# Patient Record
Sex: Male | Born: 1995 | Race: Black or African American | Smoking: Never smoker
Health system: Southern US, Community
[De-identification: ages and names within clinical notes are randomized; demographics above are authoritative.]

## PROBLEM LIST (undated history)

## (undated) DIAGNOSIS — F909 Attention-deficit hyperactivity disorder, unspecified type: Secondary | ICD-10-CM

## (undated) DIAGNOSIS — F1910 Other psychoactive substance abuse, uncomplicated: Secondary | ICD-10-CM

## (undated) DIAGNOSIS — F209 Schizophrenia, unspecified: Secondary | ICD-10-CM

---
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
## 2014-08-31 ENCOUNTER — Emergency Department (HOSPITAL_COMMUNITY)
Admission: EM | Admit: 2014-08-31 | Discharge: 2014-09-02 | Disposition: A | Discharge status: Hospice | Payer: Self-pay | Attending: Emergency Medicine | Admitting: Emergency Medicine

## 2014-08-31 ENCOUNTER — Encounter (HOSPITAL_COMMUNITY): Payer: Self-pay | Admitting: Emergency Medicine

## 2014-08-31 DIAGNOSIS — F12159 Cannabis abuse with psychotic disorder, unspecified: Secondary | ICD-10-CM | POA: Insufficient documentation

## 2014-08-31 DIAGNOSIS — F329 Major depressive disorder, single episode, unspecified: Secondary | ICD-10-CM | POA: Insufficient documentation

## 2014-08-31 DIAGNOSIS — Z046 Encounter for general psychiatric examination, requested by authority: Secondary | ICD-10-CM

## 2014-08-31 DIAGNOSIS — F1910 Other psychoactive substance abuse, uncomplicated: Secondary | ICD-10-CM

## 2014-08-31 DIAGNOSIS — Z720 Tobacco use: Secondary | ICD-10-CM | POA: Insufficient documentation

## 2014-08-31 DIAGNOSIS — R4689 Other symptoms and signs involving appearance and behavior: Secondary | ICD-10-CM

## 2014-08-31 LAB — CBC
HEMATOCRIT: 46.3 % (ref 39.0–52.0)
HEMOGLOBIN: 16.2 g/dL (ref 13.0–17.0)
MCH: 31.2 pg (ref 26.0–34.0)
MCHC: 35 g/dL (ref 30.0–36.0)
MCV: 89.2 fL (ref 78.0–100.0)
Platelets: 204 10*3/uL (ref 150–400)
RBC: 5.19 MIL/uL (ref 4.22–5.81)
RDW: 13.1 % (ref 11.5–15.5)
WBC: 6.6 10*3/uL (ref 4.0–10.5)

## 2014-08-31 LAB — COMPREHENSIVE METABOLIC PANEL
ALT: 21 U/L (ref 0–53)
AST: 99 U/L — AB (ref 0–37)
Albumin: 4.7 g/dL (ref 3.5–5.2)
Alkaline Phosphatase: 59 U/L (ref 39–117)
Anion gap: 16 — ABNORMAL HIGH (ref 5–15)
BUN: 13 mg/dL (ref 6–23)
CHLORIDE: 100 meq/L (ref 96–112)
CO2: 23 mEq/L (ref 19–32)
Calcium: 10 mg/dL (ref 8.4–10.5)
Creatinine, Ser: 1.02 mg/dL (ref 0.50–1.35)
GLUCOSE: 114 mg/dL — AB (ref 70–99)
POTASSIUM: 3.7 meq/L (ref 3.7–5.3)
Sodium: 139 mEq/L (ref 137–147)
Total Bilirubin: 0.8 mg/dL (ref 0.3–1.2)
Total Protein: 7.9 g/dL (ref 6.0–8.3)

## 2014-08-31 LAB — RAPID URINE DRUG SCREEN, HOSP PERFORMED
Amphetamines: NOT DETECTED
BENZODIAZEPINES: NOT DETECTED
Barbiturates: NOT DETECTED
Cocaine: NOT DETECTED
OPIATES: NOT DETECTED
Tetrahydrocannabinol: POSITIVE — AB

## 2014-08-31 LAB — ETHANOL

## 2014-08-31 LAB — ACETAMINOPHEN LEVEL

## 2014-08-31 LAB — SALICYLATE LEVEL

## 2014-08-31 MED ORDER — ONDANSETRON HCL 4 MG PO TABS: 4.0000 mg | ORAL_TABLET | Freq: Three times a day (TID) | ORAL | Status: DC | PRN

## 2014-08-31 MED ORDER — ALUM & MAG HYDROXIDE-SIMETH 200-200-20 MG/5ML PO SUSP: 30.0000 mL | ORAL | Status: DC | PRN

## 2014-08-31 MED ORDER — IBUPROFEN 200 MG PO TABS
600.0000 mg | ORAL_TABLET | Freq: Three times a day (TID) | ORAL | Status: DC | PRN
  Administered 2014-09-02: 600 mg via ORAL
  Filled 2014-08-31: qty 3

## 2014-08-31 MED ORDER — LORAZEPAM 1 MG PO TABS: 1.0000 mg | ORAL_TABLET | Freq: Three times a day (TID) | ORAL | Status: DC | PRN

## 2014-08-31 MED ORDER — ZOLPIDEM TARTRATE 5 MG PO TABS: 5.0000 mg | ORAL_TABLET | Freq: Every evening | ORAL | Status: DC | PRN

## 2014-08-31 MED ORDER — NICOTINE 21 MG/24HR TD PT24
21.0000 mg | MEDICATED_PATCH | Freq: Every day | TRANSDERMAL | Status: DC
  Administered 2014-09-01 – 2014-09-02 (×2): 21 mg via TRANSDERMAL
  Filled 2014-08-31 (×2): qty 1

## 2014-08-31 MED ORDER — ACETAMINOPHEN 325 MG PO TABS: 650.0000 mg | ORAL_TABLET | ORAL | Status: DC | PRN
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
## 2014-08-31 NOTE — ED Notes (Signed)
Amil AmenJulia Little-mom-[phone]. Please ask psychiatrist call her in the morning.

## 2014-08-31 NOTE — ED Notes (Signed)
1 bag of belongings placed in locker 32

## 2014-08-31 NOTE — ED Notes (Signed)
Pt and belongings wanded 

## 2014-08-31 NOTE — ED Notes (Signed)
Pt will not talk.  Only answers questions with a nod of his head.  Denies SI/HI.

## 2014-08-31 NOTE — ED Notes (Signed)
Bed: [facility] Expected date:  Expected time:  Means of arrival:  Comments: 
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
## 2014-09-01 ENCOUNTER — Encounter (HOSPITAL_COMMUNITY): Payer: Self-pay | Admitting: Registered Nurse

## 2014-09-01 DIAGNOSIS — F1994 Other psychoactive substance use, unspecified with psychoactive substance-induced mood disorder: Secondary | ICD-10-CM

## 2014-09-01 DIAGNOSIS — R4689 Other symptoms and signs involving appearance and behavior: Secondary | ICD-10-CM | POA: Diagnosis present

## 2014-09-01 NOTE — ED Notes (Signed)
Patient guarded on approach today. Answering with yes and no answers mostly. Denies any SI/HI or a/v hallucinations. Reports that he feels his thinking is clear. No bizarre behaviors at this time. Will continue to monitor.
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
## 2014-09-01 NOTE — ED Notes (Signed)
Patient came to desk to get remote control

## 2014-09-02 ENCOUNTER — Encounter (HOSPITAL_COMMUNITY): Payer: Self-pay | Admitting: Psychiatry

## 2014-09-02 DIAGNOSIS — F1919 Other psychoactive substance abuse with unspecified psychoactive substance-induced disorder: Secondary | ICD-10-CM

## 2014-09-02 DIAGNOSIS — F1910 Other psychoactive substance abuse, uncomplicated: Secondary | ICD-10-CM | POA: Diagnosis present

## 2014-09-02 DIAGNOSIS — Z046 Encounter for general psychiatric examination, requested by authority: Secondary | ICD-10-CM | POA: Insufficient documentation
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
## 2014-09-02 NOTE — Progress Notes (Signed)
  CARE MANAGEMENT ED NOTE 09/02/2014  Patient:  [name],[name]   Account Number:  [id number]  Date Initiated:  09/02/2014  Documentation initiated by:  Edd ArbourGIBBS,Rockie Vawter  Subjective/Objective Assessment:   18 yr old self pay [place] pt (previously listd iwth medicaid but not verified as eligible per registration)     Subjective/Objective Assessment Detail:   Pt BIB [employer] IVC.  IVC papers state: "Last Thursday 08/25/14, the respondent was picked up at the above residence by a group of friends around 1400.  When he returned home around 0100, the respondent was a completely different person.  Respondent began hollering and cursing    no pcp     Action/Plan:   no pcp noted provided resources see notes below   Action/Plan Detail:   Anticipated DC Date:  09/02/2014     Status Recommendation to Physician:   Result of Recommendation:    Other ED Services  Consult Working Plan    DC Planning Services  Other  Outpatient Services - Pt will follow up  PCP issues    Choice offered to / List presented to:            Status of service:  Completed, signed off  ED Comments:   ED Comments Detail:  CM spoke [place] who [place]. CM discussed and provided written information for self pay pcps, importance of pcp for f/u care, www.needymeds.org, discounted pharmacies and other [place] resources such as [facility], [employer], affordable care act,  financial assistance, DSS and  health department Reviewed resources for [place] pcps like [facility], [facility] medicine at [address], [facility], general medical clinics, [facility] plus others, medication resources, [facility] out patient pharmacies and housing Pt voiced understanding and appreciation of resources provided  Provided [facility] contact information

## 2014-09-02 NOTE — [facility] Suicide Risk Assessment (Signed)
Suicide Risk Assessment  Discharge Assessment     Demographic Factors:  Male and Adolescent or young adult  Total Time spent with patient: 30 minutes  Psychiatric Specialty Exam:     Blood pressure 130/79, pulse 77, temperature 98 F (36.7 C), temperature source Oral, resp. rate 20, SpO2 100 %.There is no height or weight on file to calculate BMI.  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 Housing Leisure Time Physical Health Resilience Social Support  Sleep:      Musculoskeletal: Strength & Muscle Tone: within normal limits Gait & Station: normal Patient leans: N/A   Mental Status Per Nursing Assessment::   On Admission:   Paranoia  Current Mental Status by Physician: NA  Loss Factors: NA  Historical Factors: NA  Risk Reduction Factors:   Sense of responsibility to family, Living with another person, especially a relative and Positive social support  Continued Clinical Symptoms:  Anxiety  Cognitive Features That Contribute To Risk:  None  Suicide Risk:  Minimal: No identifiable suicidal ideation.  Patients presenting with no risk factors but with morbid ruminations; may be classified as minimal risk based on the severity of the depressive symptoms  Discharge Diagnoses:   AXIS I:  Substance Abuse AXIS II:  Deferred AXIS III:  History reviewed. No pertinent past medical history. AXIS IV:  other psychosocial or environmental  problems and problems related to social environment AXIS V:  61-70 mild symptoms  Plan Of Care/Follow-up recommendations:  Activity:  as tolerated Diet:  heart healthy  Is patient on multiple antipsychotic therapies at discharge:  No   Has Patient had three or more failed trials of antipsychotic monotherapy by history:  No  Recommended Plan for Multiple Antipsychotic Therapies: NA    LORD, JAMISON, PMH-NP 09/02/2014, 10:24 AM

## 2014-09-02 NOTE — Progress Notes (Signed)
Pt alert, observed interacting verbally with a peer in dayroom. Calm but remains guarded. Denied A/V hallucinations and pain when assessed this AM. Contracts for safety whie in facility. Refused his breakfast when offered. Cooperative with care. Emotional support offered. Safety maintained on Q 15 minutes checks without bizarre behavior to note at this time.

## 2014-09-02 NOTE — ED Notes (Signed)
Throughout the shift periodically patient would try to barricade his door. When asked why patient would stare at Clinical research associatewriter and tech. Then he would remove items. Patient appears to be responding to internal stimuli at times. Encouragement and support provided and safety maintain.

## 2014-09-02 NOTE — Progress Notes (Signed)
Pt discharge home as per order. D/C instructions reviewed with pt and understanding verbalized. Vitals done, WNL and documented. Pt denied pain, A/V hallucinations at time of departure. Gait steady,  affect bright.  All belongings returned to pt at time of departure and sheet signed in agreement to items received. No physical distress evident at time of departure from unit.

## 2014-10-11 ENCOUNTER — Emergency Department (HOSPITAL_COMMUNITY)
Admission: EM | Admit: 2014-10-11 | Discharge: 2014-10-12 | Discharge status: Against Medical Advice | Payer: Medicaid Other | Attending: Emergency Medicine | Admitting: Emergency Medicine

## 2014-10-11 DIAGNOSIS — R51 Headache: Secondary | ICD-10-CM | POA: Insufficient documentation

## 2014-10-11 DIAGNOSIS — Z720 Tobacco use: Secondary | ICD-10-CM | POA: Insufficient documentation

## 2014-10-11 NOTE — ED Notes (Signed)
Pt in c/o migraine for the last two weeks, denies other symptoms, no distress noted

## 2014-10-19 ENCOUNTER — Encounter (HOSPITAL_COMMUNITY): Payer: Self-pay | Admitting: Emergency Medicine

## 2014-10-19 ENCOUNTER — Emergency Department (HOSPITAL_COMMUNITY)
Admission: EM | Admit: 2014-10-19 | Discharge: 2014-10-20 | Disposition: A | Discharge status: Other Acute Inpatient Hospital | Payer: Self-pay

## 2014-10-19 DIAGNOSIS — F200 Paranoid schizophrenia: Secondary | ICD-10-CM | POA: Insufficient documentation

## 2014-10-19 DIAGNOSIS — Z720 Tobacco use: Secondary | ICD-10-CM | POA: Insufficient documentation

## 2014-10-19 DIAGNOSIS — F1210 Cannabis abuse, uncomplicated: Secondary | ICD-10-CM | POA: Insufficient documentation

## 2014-10-19 LAB — COMPREHENSIVE METABOLIC PANEL
ALK PHOS: 51 U/L (ref 39–117)
ALT: 10 U/L (ref 0–53)
AST: 19 U/L (ref 0–37)
Albumin: 5 g/dL (ref 3.5–5.2)
Anion gap: 10 (ref 5–15)
BILIRUBIN TOTAL: 1.1 mg/dL (ref 0.3–1.2)
BUN: 11 mg/dL (ref 6–23)
CO2: 27 mmol/L (ref 19–32)
Calcium: 9.5 mg/dL (ref 8.4–10.5)
Chloride: 102 mEq/L (ref 96–112)
Creatinine, Ser: 0.9 mg/dL (ref 0.50–1.35)
Glucose, Bld: 87 mg/dL (ref 70–99)
POTASSIUM: 3.4 mmol/L — AB (ref 3.5–5.1)
SODIUM: 139 mmol/L (ref 135–145)
TOTAL PROTEIN: 7.8 g/dL (ref 6.0–8.3)

## 2014-10-19 LAB — ACETAMINOPHEN LEVEL: Acetaminophen (Tylenol), Serum: <10 ug/mL — ABNORMAL LOW (ref 10–30)

## 2014-10-19 LAB — CBC
HCT: 41.7 % (ref 39.0–52.0)
Hemoglobin: 13.7 g/dL (ref 13.0–17.0)
MCH: 29.8 pg (ref 26.0–34.0)
MCHC: 32.9 g/dL (ref 30.0–36.0)
MCV: 90.8 fL (ref 78.0–100.0)
Platelets: 244 10*3/uL (ref 150–400)
RBC: 4.59 MIL/uL (ref 4.22–5.81)
RDW: 13.1 % (ref 11.5–15.5)
WBC: 6.4 10*3/uL (ref 4.0–10.5)

## 2014-10-19 LAB — RAPID URINE DRUG SCREEN, HOSP PERFORMED
AMPHETAMINES: NOT DETECTED
BARBITURATES: NOT DETECTED
Benzodiazepines: NOT DETECTED
COCAINE: NOT DETECTED
OPIATES: NOT DETECTED
Tetrahydrocannabinol: POSITIVE — AB

## 2014-10-19 LAB — SALICYLATE LEVEL: Salicylate Lvl: <4 mg/dL (ref 2.8–20.0)

## 2014-10-19 LAB — ETHANOL

## 2014-10-19 MED ORDER — ACETAMINOPHEN 325 MG PO TABS: 650.0000 mg | ORAL_TABLET | ORAL | Status: DC | PRN

## 2014-10-19 MED ORDER — LORAZEPAM 1 MG PO TABS: 1.0000 mg | ORAL_TABLET | Freq: Three times a day (TID) | ORAL | Status: DC | PRN

## 2014-10-19 NOTE — ED Provider Notes (Signed)
CSN: [id number]     Arrival date & time 10/19/14  2042 History   First MD Initiated Contact with Patient 10/19/14 2106     Chief Complaint  Patient presents with  . IVC   . Psychiatric Evaluation     (Consider location/radiation/quality/duration/timing/severity/associated sxs/prior Treatment) The history is provided by the patient. The history is limited by the condition of the patient.  pt with unspecific psychiatric hx, presents via gpd w ivc papers.  Per report pt with auditory hallucinations, and thoughts of suicide.  Pt indicates he does hear voices and hears thoughts in his head telling him to kill himself.  Denies HI.  Pt has been also using drugs, not sleeping, and exhibiting intermittent bizarre and paranoid thoughts.  Pt is very limited historian w little insight into symptoms - level 5 caveat.      History reviewed. No pertinent past medical history. History reviewed. No pertinent past surgical history. No family history on file. History  Substance Use Topics  . Smoking status: Current Every Day Smoker -- 0.50 packs/day    Types: Cigarettes  . Smokeless tobacco: Never Used  . Alcohol Use: No      Review of Systems  Unable to perform ROS: Psychiatric disorder  Constitutional: Negative for fever.  Neurological: Negative for headaches.  pt very poor historian, limited historian, psych illness - level 5 caveat.       Allergies  Review of patient's allergies indicates no known allergies.  Home Medications   Prior to Admission medications   Not on File   BP 140/79 mmHg  Pulse 75  Temp(Src) 98.8 F (37.1 C) (Oral)  Resp 15  SpO2 100% Physical Exam  Constitutional: He appears well-developed and well-nourished. No distress.  Pt appear suspicious, anxious, paranoid.  Changes clothes w chair placed against door, and hiding behind stretcher chair.   HENT:  Head: Atraumatic.  Eyes: Conjunctivae are normal. Pupils are equal, round, and reactive to light. No  scleral icterus.  Neck: Neck supple. No tracheal deviation present.  Cardiovascular: Normal rate, regular rhythm, normal heart sounds and intact distal pulses.   Pulmonary/Chest: Effort normal and breath sounds normal. No accessory muscle usage. No respiratory distress.  Abdominal: He exhibits no distension.  Musculoskeletal: Normal range of motion.  Neurological: He is alert.  Ambulates w steady gait.   Skin: Skin is warm and dry. He is not diaphoretic.  Psychiatric:  Withdrawn, flat affect. +SI.    Nursing note and vitals reviewed.   ED Course  Procedures (including critical care time) Labs Review  Results for orders placed or performed during the hospital encounter of 10/19/14  Acetaminophen level  Result Value Ref Range   Acetaminophen (Tylenol), Serum <10.0 (L) 10 - 30 ug/mL  CBC  Result Value Ref Range   WBC 6.4 4.0 - 10.5 K/uL   RBC 4.59 4.22 - 5.81 MIL/uL   Hemoglobin 13.7 13.0 - 17.0 g/dL   HCT 40.9 81.1 - 91.4 %   MCV 90.8 78.0 - 100.0 fL   MCH 29.8 26.0 - 34.0 pg   MCHC 32.9 30.0 - 36.0 g/dL   RDW 78.2 95.6 - 21.3 %   Platelets 244 150 - 400 K/uL  Comprehensive metabolic panel  Result Value Ref Range   Sodium 139 135 - 145 mmol/L   Potassium 3.4 (L) 3.5 - 5.1 mmol/L   Chloride 102 96 - 112 mEq/L   CO2 27 19 - 32 mmol/L   Glucose, Bld 87 70 - 99 mg/dL  BUN 11 6 - 23 mg/dL   Creatinine, Ser 1.610.90 0.50 - 1.35 mg/dL   Calcium 9.5 8.4 - 09.610.5 mg/dL   Total Protein 7.8 6.0 - 8.3 g/dL   Albumin 5.0 3.5 - 5.2 g/dL   AST 19 0 - 37 U/L   ALT 10 0 - 53 U/L   Alkaline Phosphatase 51 39 - 117 U/L   Total Bilirubin 1.1 0.3 - 1.2 mg/dL   GFR calc non Af Amer >90 >90 mL/min   GFR calc Af Amer >90 >90 mL/min   Anion gap 10 5 - 15  Ethanol (ETOH)  Result Value Ref Range   Alcohol, Ethyl (B) <5 0 - 9 mg/dL  Salicylate level  Result Value Ref Range   Salicylate Lvl <4.0 2.8 - 20.0 mg/dL      MDM   Labs.  Psych team consulted.  Disposition per psych  team.  Reviewed nursing notes and prior charts for additional history.   Recheck pt content, alert, awaiting psych eval and likely inpatient psych tx.  dispo per psych team.      Suzi RootsKevin E Shiniqua Groseclose, MD 10/19/14 2235
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
## 2014-10-19 NOTE — ED Notes (Signed)
Pt has two bags of belongings including a pair of pants, a pair of camouflage pants, a pair of shorts, underwear, socks, white undershirt, white tennis shoes, white cell phone, partial pack of [place] cigarettes and a lighter.

## 2014-10-19 NOTE — ED Notes (Signed)
Pt spilled most of the UDS specimen, what was left in the cup was sent to lab. Pt is aware he may be asked to catch another sample.

## 2014-10-19 NOTE — [facility] Counselor (Signed)
Per Donell SievertSpencer Simon, PA, pt will be re-evaluated by psychiatry in the morning for disposition.    Cyndie MullAnna Mayson Mcneish, [facility] Triage Specialist

## 2014-10-19 NOTE — [facility] Counselor (Signed)
TTS Counselor informed pt's attending RN that behavioral health assessment would be completed shortly.  Counselor reviewed pt's IVC paperwork in preparation for assessment. Counselor also attempted to call pt's mother (who took out IVC) but no one answered phone.   Cyndie MullAnna Tayleigh Wetherell, [facility] Triage Specialist
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
## 2014-10-20 ENCOUNTER — Inpatient Hospital Stay (HOSPITAL_COMMUNITY)
Admission: AD | Admit: 2014-10-20 | Discharge: 2014-10-26 | DRG: 885 | Disposition: A | Discharge status: Home / Self Care | Payer: Federal, State, Local not specified - Other | Source: Intra-hospital | Attending: Psychiatry | Admitting: Psychiatry

## 2014-10-20 ENCOUNTER — Encounter (HOSPITAL_COMMUNITY): Payer: Self-pay

## 2014-10-20 DIAGNOSIS — Z609 Problem related to social environment, unspecified: Secondary | ICD-10-CM | POA: Diagnosis present

## 2014-10-20 DIAGNOSIS — G4700 Insomnia, unspecified: Secondary | ICD-10-CM | POA: Diagnosis present

## 2014-10-20 DIAGNOSIS — F17210 Nicotine dependence, cigarettes, uncomplicated: Secondary | ICD-10-CM | POA: Diagnosis present

## 2014-10-20 DIAGNOSIS — F200 Paranoid schizophrenia: Secondary | ICD-10-CM | POA: Insufficient documentation

## 2014-10-20 DIAGNOSIS — F909 Attention-deficit hyperactivity disorder, unspecified type: Secondary | ICD-10-CM | POA: Diagnosis present

## 2014-10-20 DIAGNOSIS — R45851 Suicidal ideations: Secondary | ICD-10-CM

## 2014-10-20 DIAGNOSIS — F1010 Alcohol abuse, uncomplicated: Secondary | ICD-10-CM | POA: Diagnosis present

## 2014-10-20 DIAGNOSIS — F28 Other psychotic disorder not due to a substance or known physiological condition: Secondary | ICD-10-CM | POA: Insufficient documentation

## 2014-10-20 DIAGNOSIS — F24 Shared psychotic disorder: Secondary | ICD-10-CM | POA: Diagnosis present

## 2014-10-20 DIAGNOSIS — F419 Anxiety disorder, unspecified: Secondary | ICD-10-CM | POA: Diagnosis present

## 2014-10-20 DIAGNOSIS — F1220 Cannabis dependence, uncomplicated: Secondary | ICD-10-CM | POA: Diagnosis present

## 2014-10-20 DIAGNOSIS — F209 Schizophrenia, unspecified: Secondary | ICD-10-CM | POA: Diagnosis present

## 2014-10-20 DIAGNOSIS — F1290 Cannabis use, unspecified, uncomplicated: Secondary | ICD-10-CM

## 2014-10-20 DIAGNOSIS — F19959 Other psychoactive substance use, unspecified with psychoactive substance-induced psychotic disorder, unspecified: Secondary | ICD-10-CM

## 2014-10-20 DIAGNOSIS — F19929 Other psychoactive substance use, unspecified with intoxication, unspecified: Secondary | ICD-10-CM | POA: Insufficient documentation

## 2014-10-20 DIAGNOSIS — F29 Unspecified psychosis not due to a substance or known physiological condition: Secondary | ICD-10-CM | POA: Diagnosis present

## 2014-10-20 HISTORY — DX: Attention-deficit hyperactivity disorder, unspecified type: F90.9

## 2014-10-20 MED ORDER — HYDROXYZINE HCL 25 MG PO TABS: 25.0000 mg | ORAL_TABLET | Freq: Three times a day (TID) | ORAL | Status: DC | PRN

## 2014-10-20 MED ORDER — ALUM & MAG HYDROXIDE-SIMETH 200-200-20 MG/5ML PO SUSP: 30.0000 mL | ORAL | Status: DC | PRN

## 2014-10-20 MED ORDER — ACETAMINOPHEN 325 MG PO TABS: 650.0000 mg | ORAL_TABLET | ORAL | Status: DC | PRN

## 2014-10-20 MED ORDER — MAGNESIUM HYDROXIDE 400 MG/5ML PO SUSP: 30.0000 mL | Freq: Every day | ORAL | Status: DC | PRN

## 2014-10-20 MED ORDER — TRAZODONE HCL 100 MG PO TABS
100.0000 mg | ORAL_TABLET | Freq: Every day | ORAL | Status: DC
  Filled 2014-10-20: qty 14
  Filled 2014-10-20 (×6): qty 1
  Filled 2014-10-20: qty 14
  Filled 2014-10-20: qty 1

## 2014-10-20 MED ORDER — PALIPERIDONE ER 3 MG PO TB24
3.0000 mg | ORAL_TABLET | Freq: Every day | ORAL | Status: DC
  Administered 2014-10-21: 3 mg via ORAL
  Filled 2014-10-20 (×3): qty 1

## 2014-10-20 MED ORDER — HYDROXYZINE HCL 25 MG PO TABS
25.0000 mg | ORAL_TABLET | Freq: Three times a day (TID) | ORAL | Status: DC | PRN
  Filled 2014-10-20: qty 20

## 2014-10-20 MED ORDER — ACETAMINOPHEN 325 MG PO TABS: 650.0000 mg | ORAL_TABLET | Freq: Four times a day (QID) | ORAL | Status: DC | PRN

## 2014-10-20 MED ORDER — PALIPERIDONE ER 3 MG PO TB24
3.0000 mg | ORAL_TABLET | Freq: Every day | ORAL | Status: DC
  Administered 2014-10-20: 3 mg via ORAL
  Filled 2014-10-20: qty 1

## 2014-10-20 MED ORDER — TRAZODONE HCL 100 MG PO TABS: 100.0000 mg | ORAL_TABLET | Freq: Every day | ORAL | Status: DC

## 2014-10-20 NOTE — BH Assessment (Signed)
[facility] Assessment Progress Note  [facility] by [name], MD.  Per Thurman CoyerEric Kaplan, RN, [facility], pt has been assigned to Rm [id number].  Pt was not able sign Consent to Release Information due to his state of mind, but reports that he does not currently have an outpatient provider.  [facility]  Pt's nurse, Rudean HittDawnaly, has been notified, and she agrees to call report to [phone].  [facility]  Doylene Canninghomas Kineta Fudala, MA Triage Specialist 10/20/2014 @ 16:14

## 2014-10-20 NOTE — ED Notes (Signed)
Patient asked to use the phone.  When he was using it he went into the dayroom to sit on the sofa.  When he finished his call he broke the phone and took the battery out of it.  He stated he did it because he was mad at his mother for telling lies about him.  He has still not slept or lain down at all today.  He is eating at least half of the food on his meal trays.  [facility], but does not seem to fully understand why even though it has been explained to him.
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
## 2014-10-20 NOTE — ED Notes (Signed)
[facility]  Left the unit ambulatory.

## 2014-10-20 NOTE — ED Notes (Signed)
Patient continues to sit up in chair even though bed linen was change. Encouragement and support provided and safety maintain.

## 2014-10-20 NOTE — ED Notes (Signed)
Patient continues to sit up in chair in his room. Writer ask patient if he would to lay down in his bed. Patient ask writer if the bed sheets could be change. Writer informs patient the sheets  would be change.

## 2014-10-21 ENCOUNTER — Encounter (HOSPITAL_COMMUNITY): Payer: Self-pay

## 2014-10-21 DIAGNOSIS — F1220 Cannabis dependence, uncomplicated: Secondary | ICD-10-CM | POA: Diagnosis present

## 2014-10-21 DIAGNOSIS — F19959 Other psychoactive substance use, unspecified with psychoactive substance-induced psychotic disorder, unspecified: Secondary | ICD-10-CM

## 2014-10-21 DIAGNOSIS — R45850 Homicidal ideations: Secondary | ICD-10-CM

## 2014-10-21 DIAGNOSIS — F209 Schizophrenia, unspecified: Principal | ICD-10-CM

## 2014-10-21 DIAGNOSIS — F19929 Other psychoactive substance use, unspecified with intoxication, unspecified: Secondary | ICD-10-CM | POA: Insufficient documentation

## 2014-10-21 DIAGNOSIS — F29 Unspecified psychosis not due to a substance or known physiological condition: Secondary | ICD-10-CM | POA: Diagnosis present

## 2014-10-21 MED ORDER — BENZTROPINE MESYLATE 0.5 MG PO TABS
0.5000 mg | ORAL_TABLET | Freq: Every day | ORAL | Status: DC
  Administered 2014-10-21 – 2014-10-25 (×5): 0.5 mg via ORAL
  Filled 2014-10-21 (×2): qty 1
  Filled 2014-10-21: qty 14
  Filled 2014-10-21 (×3): qty 1
  Filled 2014-10-21: qty 14
  Filled 2014-10-21: qty 1

## 2014-10-21 MED ORDER — HALOPERIDOL 2 MG PO TABS
2.0000 mg | ORAL_TABLET | Freq: Every day | ORAL | Status: DC
  Administered 2014-10-21 – 2014-10-25 (×5): 2 mg via ORAL
  Filled 2014-10-21: qty 14
  Filled 2014-10-21 (×6): qty 1
  Filled 2014-10-21: qty 14

## 2014-10-21 NOTE — [facility] Suicide Risk Assessment (Signed)
[facility] INPATIENT:  Family/Significant Other Suicide Prevention Education  Suicide Prevention Education:  Education Completed; [name], [id number]  has been identified by the patient as the family member/significant other with whom the patient will be residing, and identified as the person(s) who will aid the patient in the event of a mental health crisis (suicidal ideations/suicide attempt).  With written consent from the patient, the family member/significant other has been provided the following suicide prevention education, prior to the and/or following the discharge of the patient.  The suicide prevention education provided includes the following:  Suicide risk factors  Suicide prevention and interventions  National Suicide Hotline telephone number  [facility] assessment telephone number  [facility] Emergency Assistance 911  [facility] and/or Residential Mobile Crisis Unit telephone number  Request made of family/significant other to:  Remove weapons (e.g., guns, rifles, knives), all items previously/currently identified as safety concern.    Remove drugs/medications (over-the-counter, prescriptions, illicit drugs), all items previously/currently identified as a safety concern.  The family member/significant other verbalizes understanding of the suicide prevention education information provided.  The family member/significant other agrees to remove the items of safety concern listed above.  Daryel Geraldorth, Nakyla Bracco B 10/21/2014, 4:02 PM
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
## 2014-10-21 NOTE — [facility] Group Notes (Signed)
[facility]   10/21/2014 9:51 AM  Participation Quality:  Invited.  Was in the shower    [place], AddisonRodney B

## 2014-10-21 NOTE — Progress Notes (Signed)
The patient stood in the hallway while group took place and did not have anything to share.

## 2014-10-21 NOTE — [facility] Group Notes (Signed)
[facility] LCSW Group Therapy  10/21/2014  1:05 PM  Type of Therapy:  Group therapy  Participation Level:  Active  Participation Quality:  Attentive  Affect:  Flat  Cognitive:  Oriented  Insight:  Limited  Engagement in Therapy:  Limited  Modes of Intervention:  Discussion, Socialization  Summary of Progress/Problems:  Chaplain was here to lead a group on themes of hope and courage. [name] initially declined to pick a picture.  He picked one after everyone else was done.  Held up a picture of a rusty chain.  "This chain is going to break."  Unable to elaborate further.  Zachary Geraldorth, [name] B 10/21/2014 1:33 PM

## 2014-10-21 NOTE — Progress Notes (Signed)
Pt is a 19 yr old IVC for psychosis. Pt is a poor historian and initially reports that he is here for abusing THC and ETOH. Pt then reported that he is here because " they think I'm responding to voices". Pt is denying any SI/HI/AVH. Pt appears bizarre on admission. Pt also appears to be responding to internal stimuli. Pt reports a hx of ADHD. Pt reports being off of ADHD medication for over about 12 years. Pt denies any other PMH. Pt reports occasional ETOH use. "They say I be out of it". Pt orientated to the unit's polices and procedures. Pt verbalizes the goal of " having my own house". Pt failed with coming up with an additional goal.

## 2014-10-21 NOTE — Tx Team (Signed)
Initial Interdisciplinary Treatment Plan   PATIENT STRESSORS: Substance abuse   PATIENT STRENGTHS: General fund of knowledge Physical Health Supportive family/friends   PROBLEM LIST: Problem List/Patient Goals Date to be addressed Date deferred Reason deferred Estimated date of resolution  "Anxiety"  1/7     Pt reports a hx of AH. " They (ED) sent me here because they thought I was responding to voices".  Psychosis (paranoia noted) 1/7     Pt initially reported that he was here because he used " THC and ETOH". Pt reports using ETOH social. "People say I be out of it" 1/7     Pt repeatedly denied having any two goals this time. Pt eventually voiced " I want to get my own house".  1/7                                    DISCHARGE CRITERIA:  Improved stabilization in mood, thinking, and/or behavior Medical problems require only outpatient monitoring Motivation to continue treatment in a less acute level of care Need for constant or close observation no longer present Reduction of life-threatening or endangering symptoms to within safe limits Verbal commitment to aftercare and medication compliance  PRELIMINARY DISCHARGE PLAN: Attend PHP/IOP Attend 12-step recovery group  PATIENT/FAMIILY INVOLVEMENT: This treatment plan has been presented to and reviewed with the patient, [name].  The patient and family have been given the opportunity to ask questions and make suggestions.  [name] A 10/21/2014, 5:38 AM
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
## 2014-10-21 NOTE — Progress Notes (Signed)
D: Pt presents anxious this morning. Pt observed to be cautious during shift assessment. Pt forwarded little information and denied having any SI/HI/AVH. Pt denies feeling depressed. Pt appears paranoid this morning. During med administration but stared at the medication before taking medicine. Pt then asked for a cup to spit in and then stared at that. Pt walked around wide-eyed as if he is worried about something. Pt has delayed responses when asked questions. Pt compliant with taking meds.  A:  Medications administered as ordered per MD. Verbal support given. Pt encouraged to attend groups. 15 minute checks performed for safety.  R: Pt safety maintained. Pt verbalized not to harm self.
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
## 2014-10-22 ENCOUNTER — Encounter (HOSPITAL_COMMUNITY): Payer: Self-pay | Admitting: Registered Nurse

## 2014-10-22 DIAGNOSIS — F1220 Cannabis dependence, uncomplicated: Secondary | ICD-10-CM

## 2014-10-22 DIAGNOSIS — F28 Other psychotic disorder not due to a substance or known physiological condition: Secondary | ICD-10-CM

## 2014-10-22 NOTE — Progress Notes (Signed)
D: Pt visible within the milieu but with minimal interaction with others. Pt appears paranoid and cautious in behavior. Pt voices that he is ready to leave. " I don't know why I'm still here". " I don't want to harm nobody or myself". It was reported to this Clinical research associatewriter that pt's mom wanted to speak with Clinical research associatewriter about her son. Pt's mom was not listed on the consent to release information form. Pt declined for writer to speak with his mom about his stay here at this time. Pt did speak with his mom afterwards. Pt is denying any SI/HI/AVH. Pt is compliant with his medications. A: Writer administered scheduled medications to pt, per MD orders. Continued support and availability as needed was extended to this pt. Staff continue to monitor pt with q6115min checks.  R: No adverse drug reactions noted. Pt receptive to treatment. Pt remains safe at this time.

## 2014-10-22 NOTE — Progress Notes (Signed)
Adult Psychoeducational Group Note  Date:  10/22/2014 Time:  4:25 PM  Group Topic/Focus:  Healthy Communication:   The focus of this group is to discuss communication, barriers to communication, as well as healthy ways to communicate with others.  Participation Level:  Active  Participation Quality:  Appropriate  Affect:  Appropriate  Cognitive:  Appropriate  Insight: Appropriate  Engagement in Group:  Engaged  Modes of Intervention:  Discussion and Problem-solving  Additional Comments:  Pt stated his day is going all right.  Pt stated two coping skills he use is listening to music and counting money.  Pt stated a favorite movie is [employer].  Wynema BirchCagle, Damoney Julia D 10/22/2014, 4:25 PM

## 2014-10-22 NOTE — [facility] Group Notes (Signed)
The focus of this group is to educate the patient on the purpose and policies of crisis stabilization and provide a format to answer questions about their admission.  The group details unit policies and expectations of patients while admitted. This group was also time to discuss the patient self inventory at 1000 hrs. Patient did not attend this group. 

## 2014-10-22 NOTE — Progress Notes (Signed)
The focus of this group is to help patients review their daily goal of treatment and discuss progress on daily workbooks. Pt attended the evening group session but responded minimally to discussion prompts from the Writer. Pt shared that today was a good day, but could not specify a reason why. Pt reported having no additional needs from Nursing Staff this evening when asked. Pt appeared anxious in group and he averted his gaze when speaking/being spoken to.

## 2014-10-22 NOTE — [facility] Group Notes (Signed)
Patient did not attend the RN group at 0930 hrs. 

## 2014-10-22 NOTE — Progress Notes (Signed)
D: Patient denies SI/HI and A/V hallucinations; patient reports sleep is fair; reports appetite is fair; reports energy level is normal ; reports ability to concentrate is good; rates depression as 5/10; rates hopelessness 4/10; rates anxiety as 4/10; patient reports some irritability  A: Monitored q 15 minutes; patient encouraged to attend groups; patient educated about medications; patient given medications per physician orders; patient encouraged to express feelings and/or concerns  R: Patient is irritable but appears to try and keep self calm; patient asked to moved from his room and when patient was asked why the patient stated " he is weird and that he did not like him"; patient was asked what did the roommate do and the patient stated " he walks around the room"; patient would not give permission to allow his mother to ask about his care or to know how he is doing; patient is very soft spoken to the point of mumbling and you must ask him to repeat himself; patient is very minimal, guarded, and cautious; patient's interaction with staff and peers is very minimal

## 2014-10-22 NOTE — Progress Notes (Signed)
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 ADHD (attention deficit hyperactivity disorder)    Axis IV: other psychosocial or environmental problems, problems related to social environment and problems with primary support group Axis V: 31-40 impairment in reality testing  ADL's:  Intact  Sleep: Good  Appetite:  Good  Suicidal Ideation:  Denies  Homicidal Ideation:  Denies  AEB (as evidenced by):  Psychiatric Specialty Exam: Physical Exam  Constitutional: He is oriented to person, place, and time.  HENT:  Head: Normocephalic.  Neck: Normal range of motion.  Respiratory: Effort normal.  Musculoskeletal: Normal range of motion.  Neurological: He is alert and oriented to person, place, and time.  Skin: Skin is warm and dry.  Psychiatric: His speech is normal. He is slowed and withdrawn. He exhibits a depressed mood. He expresses no homicidal and no suicidal ideation.    ROS  Blood pressure 106/56, pulse 108, temperature 98 F (36.7 C), temperature source Oral, resp. rate 20, height  (1.803 m), weight 72.122 kg (159 lb).Body mass index is 22.19 kg/(m^2).  General Appearance: Casual and Fairly Groomed  Patent attorney::  Good  Speech:  Clear and Coherent and Slow  Volume:  Decreased  Mood:  Depressed  Affect:  Labile  Thought Process:  Irrelevant  Orientation:  Full (Time, Place, and Person)  Thought  Content:  Paranoid Ideation  Suicidal Thoughts:  No  Homicidal Thoughts:  No  Memory:  Immediate;   Fair Recent;   Fair Remote;   Fair  Judgement:  Poor  Insight:  Lacking  Psychomotor Activity:  Normal  Concentration:  Fair  Recall:  [employer] of Knowledge:Fair  Language: Fair  Akathisia:  No  Handed:  Right  AIMS (if indicated):     Assets:  Physical Health  Sleep:  Number of Hours: 6.75   Musculoskeletal: Strength & Muscle Tone: within normal limits Gait & Station: normal Patient leans: N/A  Current Medications: Current Facility-Administered Medications  Medication Dose Route Frequency Provider Last Rate Last Dose  . acetaminophen (TYLENOL) tablet 650 mg  650 mg Oral Q6H PRN Nanine Means, NP      . alum & mag hydroxide-simeth (MAALOX/MYLANTA) 200-200-20 MG/5ML suspension 30 mL  30 mL Oral Q4H PRN Nanine Means, NP      . benztropine (COGENTIN) tablet 0.5 mg  0.5 mg Oral QHS Fransisca Kaufmann, NP   0.5 mg at 10/21/14 2202  . haloperidol (HALDOL) tablet 2 mg  2 mg Oral QHS Fransisca Kaufmann, NP   2 mg at 10/21/14 2202  . hydrOXYzine (ATARAX/VISTARIL) tablet 25 mg  25 mg Oral TID PRN Nanine Means, NP      . magnesium hydroxide (MILK OF MAGNESIA) suspension 30 mL  30 mL Oral Daily PRN Nanine Means, NP      . traZODone (DESYREL) tablet 100 mg  100 mg Oral QHS Nanine Means, NP   100 mg at 10/20/14 2200    Lab Results: No results found for this or  any previous visit (from the past 48 hour(s)).  Physical Findings: AIMS: Facial and Oral Movements Muscles of Facial Expression: None, normal Lips and Perioral Area: None, normal Jaw: None, normal Tongue: None, normal,Extremity Movements Upper (arms, wrists, hands, fingers): None, normal Lower (legs, knees, ankles, toes): None, normal, Trunk Movements Neck, shoulders, hips: None, normal, Overall Severity Severity of abnormal movements (highest score from questions above): None, normal Incapacitation due to abnormal movements: None,  normal Patient's awareness of abnormal movements (rate only patient's report): No Awareness, Dental Status Current problems with teeth and/or dentures?: No Does patient usually wear dentures?: No  CIWA:    COWS:     Treatment Plan Summary: Daily contact with patient to assess and evaluate symptoms and progress in treatment Medication management  Will continue with current treatment plan for inpatient and stabilization.    Plan:  Medical Decision Making Problem Points:  Established problem, stable/improving (1), Review of last therapy session (1) and Review of psycho-social stressors (1) Data Points:  Review or order clinical lab tests (1) Review of medication regiment & side effects (2)  I certify that inpatient services furnished can reasonably be expected to improve the patient's condition.   Irvin Bastin, FNP-BC 10/22/2014, 3:03 PM

## 2014-10-23 DIAGNOSIS — F28 Other psychotic disorder not due to a substance or known physiological condition: Secondary | ICD-10-CM | POA: Insufficient documentation

## 2014-10-23 NOTE — Plan of Care (Signed)
Problem: Ineffective individual coping Goal: STG: Patient will remain free from self harm Outcome: Progressing Patient remains free from self harm. 15 minute checks continued per protocol for patient safety.   Problem: Diagnosis: Increased Risk For Suicide Attempt Goal: LTG-Patient Will Report Improvement in Psychotic Symptoms LTG (by discharge) : Patient will report improvement in psychotic symptoms.  Outcome: Not Progressing Patient denies having any psychotic symptoms but is observed by RN to be paranoid at times and is pacing the halls. Goal: STG-Patient Will Attend All Groups On The Unit Outcome: Progressing Patient did attend RN group this morning but refused to participate.   Comments:  Alena BillsGuthrie, Zyionna Pesce A, RN

## 2014-10-23 NOTE — Progress Notes (Signed)
D:Pt noted for improved eye contact. Pt is visible within the milieu but with little to no interaction with others. Pt continues to question how long his stay would be here. Pt is also under the assumption that he is to be transferred to another facility at discharge. Pt informed that his pending discharge plans does not include a transfer to another inpatient facility at this time. Pt is engaging more in conversation with this Clinical research associatewriter. Pt continues to have poor insight of his mental health state. Pt is noted to be less paranoid in behavior. Pt is increasingly becoming more clear and coherent. Pt is currently denying any SI/HI/AVH. Pt was able to repeat the indications of his qhs Haldol to Clinical research associatewriter. Pt verbalizes and actively shows his compliance with his current plan of care.Pt reports getting a sufficient amount of sleep without Trazodone.  A: Writer administered scheduled medications to pt, per MD orders. Continued support and availability as needed was extended to this pt. Staff continue to monitor pt with q2815min checks.  R: No adverse drug reactions noted. Pt receptive to treatment. Pt remains safe at this time.

## 2014-10-23 NOTE — Progress Notes (Signed)
D: Patient is alert and oriented. Pt's mood and affect is suspicious and flat. Pt's eye contact is brief and glaring at times. Pt's speech is soft and hard to hear at times. Pt forwards little with RN. Pt remains paranoid at times. Pt requests hygiene products. Pt reports his depression is 1/10 and denies hopelessness and anxiety. Pt states his goal for the day is "getting myself together." Pt writes on his self inventory sheet response to having additional questions is "Nothing just keep watching dorothy at night." When RN later reviewed self inventory sheet with pt he states "Oh I meant to write door on there;" pt requests that his door be left open at night. Pt refused to respond to RN questions this morning d/t the need to "spit," pt was witholding saliva in mouth. Pt denies SI/HI and AVH. Patient observed pacing the halls some this afternoon. Pt remains quiet and not interacting with others. A: Hygiene products provided to pt. Encouragement/Support provided to pt. Active listening by RN. 15 minute checks continued per protocol for patient safety.  R: Patient cooperative to nursing interventions.

## 2014-10-23 NOTE — [facility] Group Notes (Signed)
[facility] LCSW Group Therapy  10/23/2014   10:00 AM   Type of Therapy:  Group Therapy  Participation Level:  Active  Participation Quality:  Appropriate and Attentive  Affect:  Appropriate, Bright  Cognitive:  Alert and Appropriate  Insight:  Developing/Improving and Engaged  Engagement in Therapy:  Developing/Improving and Engaged  Modes of Intervention:  Clarification, Confrontation, Discussion, Education, Exploration, Limit-setting, Orientation, Problem-solving, Rapport Building, Dance movement psychotherapisteality Testing, Socialization and Support  Summary of Progress/Problems: The main focus of today's process group was to identify the patient's current support system and decide on other supports that can be put in place.  An emphasis was placed on using counselor, doctor, therapy groups, 12-step groups, and problem-specific support groups to expand supports, as well as doing something different than has been done before.  Pt was quiet in group but appeared to actively listen.  Pt stated that this mom, sister and dad are his support system.    Zachary IvanChelsea Horton, LCSW 10/23/2014 12:52 PM

## 2014-10-23 NOTE — Progress Notes (Signed)
Patient ID: [name], male   DOB: [birth year], 19 y.o.   MRN: [id number] [facility] MD Progress Note  10/23/2014 3:31 PM [name]  MRN:  [id number] Subjective:  Patient chart reviewed and patient discussed with nursing.  Patient sitting in his room alone.  States that he just wanted to be alone a while.  Patient states that he is attending group but not talking.  Patient continues to appear withdrawn and paranoid.  States that he is tolerating his medications without adverse affect.  States that he is sleeping and eating without difficulty.   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 ADHD (attention deficit hyperactivity disorder)    Axis IV: other psychosocial or environmental problems, problems related to social environment and problems with primary support group Axis V: 31-40 impairment in reality testing  ADL's:  Intact  Sleep: Good  Appetite:  Good  Suicidal Ideation:  Denies  Homicidal Ideation:  Denies  AEB (as evidenced by):  Psychiatric Specialty Exam: Physical Exam  Constitutional: He is oriented to person, place, and time.  HENT:  Head: Normocephalic.  Neck: Normal range of motion.  Respiratory: Effort normal.  Musculoskeletal: Normal range of motion.  Neurological: He is alert and oriented to person, place, and time.  Skin: Skin is warm and dry.  Psychiatric: His speech is normal. He is slowed and withdrawn. Thought content is paranoid. He exhibits a depressed mood. He expresses no homicidal and no suicidal ideation.    Review of Systems   Psychiatric/Behavioral: Depression: Denies. Hallucinations: Denies. Substance abuse: THC. Nervous/anxious: Denies. Insomnia: Denies.   All other systems reviewed and are negative.   Blood pressure 124/63, pulse 78, temperature 98.2 F (36.8 C), temperature source Oral, resp. rate 16, height  (1.803 m), weight 72.122 kg (159 lb).Body mass index is 22.19 kg/(m^2).  General Appearance: Casual and Fairly Groomed  Patent attorney::  Good  Speech:  Clear and Coherent and Slow  Volume:  Decreased  Mood:  Depressed  Affect:  Labile  Thought Process:  Irrelevant  Orientation:  Full (Time, Place, and Person)  Thought Content:  Paranoid Ideation  Suicidal Thoughts:  No  Homicidal Thoughts:  No  Memory:  Immediate;   Fair Recent;   Fair Remote;   Fair  Judgement:  Poor  Insight:  Lacking  Psychomotor Activity:  Normal  Concentration:  Fair  Recall:  [employer] of Knowledge:Fair  Language: Fair  Akathisia:  No  Handed:  Right  AIMS (if indicated):     Assets:  Physical Health  Sleep:  Number of Hours: 6.75   Musculoskeletal: Strength & Muscle Tone: within normal limits Gait & Station: normal Patient leans: N/A  Current Medications: Current Facility-Administered Medications  Medication Dose Route Frequency Provider Last Rate Last Dose  . acetaminophen (TYLENOL) tablet 650 mg  650 mg Oral Q6H PRN Nanine Means, NP      . alum & mag hydroxide-simeth (MAALOX/MYLANTA) 200-200-20 MG/5ML suspension 30 mL  30 mL Oral Q4H PRN Nanine Means, NP      . benztropine (COGENTIN) tablet 0.5 mg  0.5 mg Oral QHS Fransisca Kaufmann, NP   0.5 mg  at 10/22/14 2243  . haloperidol (HALDOL) tablet 2 mg  2 mg Oral QHS Fransisca KaufmannLaura Davis, NP   2 mg at 10/22/14 2243  . hydrOXYzine (ATARAX/VISTARIL) tablet 25 mg  25 mg Oral TID PRN Nanine MeansJamison Lord, NP      . magnesium hydroxide (MILK OF MAGNESIA) suspension 30 mL  30 mL Oral Daily PRN Nanine MeansJamison Lord, NP      . traZODone (DESYREL) tablet 100 mg  100 mg Oral QHS Nanine MeansJamison Lord, NP    100 mg at 10/20/14 2200    Lab Results: No results found for this or any previous visit (from the past 48 hour(s)).  Physical Findings: AIMS: Facial and Oral Movements Muscles of Facial Expression: None, normal Lips and Perioral Area: None, normal Jaw: None, normal Tongue: None, normal,Extremity Movements Upper (arms, wrists, hands, fingers): None, normal Lower (legs, knees, ankles, toes): None, normal, Trunk Movements Neck, shoulders, hips: None, normal, Overall Severity Severity of abnormal movements (highest score from questions above): None, normal Incapacitation due to abnormal movements: None, normal Patient's awareness of abnormal movements (rate only patient's report): No Awareness, Dental Status Current problems with teeth and/or dentures?: No Does patient usually wear dentures?: No  CIWA:    COWS:     Treatment Plan Summary: Daily contact with patient to assess and evaluate symptoms and progress in treatment Medication management  Will continue with current treatment plan   Plan:  Medical Decision Making Problem Points:  Established problem, stable/improving (1), Review of last therapy session (1) and Review of psycho-social stressors (1) Data Points:  Review or order clinical lab tests (1) Review of medication regiment & side effects (2)  I certify that inpatient services furnished can reasonably be expected to improve the patient's condition.   Rankin, Shuvon, FNP-BC 10/23/2014, 3:31 PM

## 2014-10-23 NOTE — Progress Notes (Signed)
[facility] Group Notes:  (Nursing/MHT/Case Management/Adjunct)  Date:  10/23/2014  Time:  5:27 PM  Type of Therapy:  Psychoeducational Skills  Participation Level:  Active  Participation Quality:  Appropriate and Attentive  Affect:  Appropriate  Cognitive:  Appropriate  Insight:  Appropriate  Engagement in Group:  Engaged  Modes of Intervention:  Activity  Summary of Progress/Problems:  [name] 10/23/2014, 5:27 PM 

## 2014-10-23 NOTE — [facility] Group Notes (Signed)
[facility] Group Notes:  (Nursing/MHT/Case Management/Adjunct)  Date:  10/23/2014  Time:  0930am  Type of Therapy:  Support Systems Group with RN's  Participation Level:  None  Participation Quality:  Attentive  Affect:  Flat  Cognitive:  No participation  Insight:  None  Engagement in Group:  Poor  Modes of Intervention:  Discussion, Education and Support  Summary of Progress/Problems: Patient attended group but refused to respond when prompted.  [name], [name] 10/23/2014, 10:43 AM

## 2014-10-23 NOTE — Progress Notes (Signed)
The focus of this group is to educate the patient on the purpose and policies of crisis stabilization and provide a format to answer questions about their admission.  The group details unit policies and expectations of patients while admitted and the importance of filling out self inventory sheet was discussed.
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
## 2014-10-24 DIAGNOSIS — F1290 Cannabis use, unspecified, uncomplicated: Secondary | ICD-10-CM

## 2014-10-24 DIAGNOSIS — F1099 Alcohol use, unspecified with unspecified alcohol-induced disorder: Secondary | ICD-10-CM

## 2014-10-24 NOTE — Progress Notes (Signed)
Adult Psychoeducational Group Note  Date:  10/24/2014 Time:  11:34 PM  Group Topic/Focus:  Wrap-Up Group:   The focus of this group is to help patients review their daily goal of treatment and discuss progress on daily workbooks.  Participation Level:  Minimal  Participation Quality:  Resistant  Affect:  Flat  Cognitive:  Appropriate  Insight: Limited  Engagement in Group:  Poor and Resistant  Modes of Intervention:  Discussion  Additional Comments:  Pt attended group, however only spoke when spoken to and in a very soft voice which was sometimes hard to hear. Pt stated he maintains wellness by exercising and walking.  Caswell CorwinOwen, Tekila Caillouet C 10/24/2014, 11:34 PM

## 2014-10-24 NOTE — [facility] Group Notes (Signed)
[facility] LCSW Group Therapy  10/24/2014 1:32 PM  Type of Therapy:  Group Therapy  Participation Level:  Minimal  Participation Quality:  Inattentive  Affect:  Depressed and Flat  Cognitive:  Lacking  Insight:  Lacking  Engagement in Therapy:  Poor  Modes of Intervention:  Confrontation, Discussion, Education, Exploration, Problem-solving, Rapport Building, Socialization and Support  Summary of Progress/Problems: Today's Topic: Overcoming Obstacles. Pt identified obstacles faced currently and processed barriers involved in overcoming these obstacles. Pt identified steps necessary for overcoming these obstacles and explored motivation (internal and external) for facing these difficulties head on. Pt further identified one area of concern in their lives and chose a skill of focus pulled from their "toolbox." [name] was inattentive and disengaged during today's processing group. He was unable and unwilling to identify an obstacle that he is facing or that he has overcome. At this time, [name] does not demonstrate progress in the group setting with no insight. [name] presents with depressed mood/flat/paranoid affect.    Smart, [name] LCSWA 10/24/2014, 1:32 PM

## 2014-10-24 NOTE — Progress Notes (Signed)
Patient ID: [name], male   DOB: 04-Jul-1996, 19 y.o.   MRN: [id number] [facility]  10/24/2014 3:14 PM [name]  MRN:  [id number] Subjective: Patient states "I think I am ready to be discharged .' Patient reports this as a misunderstanding and that he overheard his mother talking to his little sister on the phone that "she took his money" and that is why he accused her of doing so and not because he is paranoid. Patient reports wanting to go back to his brother on discharge.  Objective: Patient seen and chart reviewed.Patient discussed with nursing staff and treatment team .  Patient today seen more on the unit. Seen interactive ,more than he was on admission ,smiles intermittently and is more logical when spoken to. Patient denies SI/HI/AH/VH. Reports that he has never seen or heard anything in his entire life. Patient reports that he abuses marijuana and alcohol ,denies using anything else.  Denies any new concerns.  Per staff ,patient seen at times to be internally preoccupied and slow to respond (unknown if this is behavioral and selectively towards some staff) . Patient also not drinking water to swallow his pills . Patient also seen with ?hypersalivation . Patient to be observed on the unit.  Diagnosis:   DSM5: Primary Psychiatric Diagnosis: Unspecified schizophrenia spectrum and other psychotic disorder  R/O Cannabis-induced psychotic disorder     Secondary Psychiatric Diagnosis: Cannabis use disorder,moderate Alcohol use disorder,mild to moderate  Non Psychiatric Diagnosis: See PMH      Total Time spent with patient: 45 minutes   Past Medical History  Diagnosis Date  . ADHD (attention deficit hyperactivity disorder)    ADL's:  Intact  Sleep: Good  Appetite:  Good :  Psychiatric Specialty Exam: Physical Exam  Constitutional: He is oriented to person, place, and time.  HENT:  Head: Normocephalic.  Neck: Normal range of motion.   Respiratory: Effort normal.  Musculoskeletal: Normal range of motion.  Neurological: He is alert and oriented to person, place, and time.  Skin: Skin is warm and dry.  Psychiatric: His speech is normal. His mood appears anxious. He is not slowed and not withdrawn. Thought content is not paranoid. Cognition and memory are normal. He expresses impulsivity. He does not exhibit a depressed mood. He expresses no homicidal and no suicidal ideation.    Review of Systems  Psychiatric/Behavioral: Depression: Denies. Hallucinations: Denies. Substance abuse: THC. Nervous/anxious: Denies. Insomnia: Denies.   All other systems reviewed and are negative.   Blood pressure 128/66, pulse 66, temperature 98.2 F (36.8 C), temperature source Oral, resp. rate 20, height 5\' 11"  (1.803 m), weight 72.122 kg (159 lb).Body mass index is 22.19 kg/(m^2).  General Appearance: Casual and Fairly Groomed  Patent attorneyye Contact::  Good  Speech:  Clear and Coherent  Volume:  Decreased  Mood:  Depressed  Affect:  Labile  Thought Process:  Linear  Orientation:  Full (Time, Place, and Person)  Thought Content:  Paranoid Ideation, IMPROVING  Suicidal Thoughts:  No  Homicidal Thoughts:  No  Memory:  Immediate;   Fair Recent;   Fair Remote;   Fair  Judgement:  Poor  Insight:  Lacking  Psychomotor Activity:  Normal  Concentration:  Fair  Recall:  [employer]Fair  Fund of Knowledge:Fair  Language: Fair  Akathisia:  No  Handed:  Right  AIMS (if indicated):     Assets:  Physical Health  Sleep:  Number of Hours: 6.75   Musculoskeletal: Strength & Muscle Tone: within  normal limits Gait & Station: normal Patient leans: N/A  Current Medications: Current Facility-Administered Medications  Medication Dose Route Frequency Provider Last Rate Last Dose  . acetaminophen (TYLENOL) tablet 650 mg  650 mg Oral Q6H PRN Nanine Means, NP      . alum & mag hydroxide-simeth (MAALOX/MYLANTA) 200-200-20 MG/5ML suspension 30 mL  30 mL Oral Q4H PRN  Nanine Means, NP      . benztropine (COGENTIN) tablet 0.5 mg  0.5 mg Oral QHS Fransisca Kaufmann, NP   0.5 mg at 10/23/14 2129  . haloperidol (HALDOL) tablet 2 mg  2 mg Oral QHS Fransisca Kaufmann, NP   2 mg at 10/23/14 2129  . hydrOXYzine (ATARAX/VISTARIL) tablet 25 mg  25 mg Oral TID PRN Nanine Means, NP      . magnesium hydroxide (MILK OF MAGNESIA) suspension 30 mL  30 mL Oral Daily PRN Nanine Means, NP      . traZODone (DESYREL) tablet 100 mg  100 mg Oral QHS Nanine Means, NP   100 mg at 10/20/14 2200    Lab Results: No results found for this or any previous visit (from the past 48 hour(s)).  Physical Findings: AIMS: Facial and Oral Movements Muscles of Facial Expression: None, normal Lips and Perioral Area: None, normal Jaw: None, normal Tongue: None, normal,Extremity Movements Upper (arms, wrists, hands, fingers): None, normal Lower (legs, knees, ankles, toes): None, normal, Trunk Movements Neck, shoulders, hips: None, normal, Overall Severity Severity of abnormal movements (highest score from questions above): None, normal Incapacitation due to abnormal movements: None, normal Patient's awareness of abnormal movements (rate only patient's report): No Awareness, Dental Status Current problems with teeth and/or dentures?: No Does patient usually wear dentures?: No  CIWA:    COWS:     Treatment Plan Summary: Daily contact with patient to assess and evaluate symptoms and progress in treatment Medication management  Assessment: Patient is a 19 year old AAM ,who presented after having ?paranoia as well as agitation at home. Patient's UDS positive for THC , also reports alcohol abuse. Patient denies abusing any other drugs and reports that his mother was talking to his little sister on the phone about taking his money and that upset him. However per CSW ,mother reports patient as very paranoid as well as agitated at home.Mother concerned if this is a primary psychiatric issues rather than 2/2 drug  induced. Patient per review of past notes in EHR had similar visit to ED after he had gone out with friends for a while . At that time also his UDS was positive for THC. Patient seems to be improving ,is more linear and goal directed ,not seen responding to internal stimuli.   Plan: Will continue Haldol 2 mg po qhs along with Cogentin 0.5 mg po qhs for EPS. Will continue Trazodone 100 mg po qhs for sleep. Vistaril prn for anxiety sx. Will order labs ,TSH ,lipid panel,hba1c .   Medical Decision Making Problem Points:  Established problem, stable/improving (1), Review of last therapy session (1) and Review of psycho-social stressors (1) Data Points:  Review or order clinical lab tests (1) Review of medication regiment & side effects (2)  I certify that inpatient services furnished can reasonably be expected to improve the patient's condition.   Laurena Valko, md 10/24/2014, 3:14 PM

## 2014-10-24 NOTE — Plan of Care (Signed)
Problem: Alteration in thought process Goal: LTG-Patient behavior demonstrates decreased signs psychosis Pt presents anxious this morning. Pt observed to be cautious during shift assessment. Pt forwarded little information and denied having any SI/HI/AVH. Pt denies feeling depressed. Pt appears paranoid this morning. By d/c, pt will demonstrate increased mood stability and decrease in paranoia. Goal progressing. Heather SmaThe Sherwin-Williamsrt, LCSWA 10/21/2014 1:25 PM  Outcome: Not Progressing Patient presents quiet, responds minimally to RN, is isolative, and is observed pacing the halls this morning.  Problem: Ineffective individual coping Goal: STG: Patient will remain free from self harm Outcome: Progressing Patient remains free from self harm. Pt denies having any self harm thoughts today. 15 minute checks continued per protocol for patient safety.   Problem: Diagnosis: Increased Risk For Suicide Attempt Goal: STG-Patient Will Attend All Groups On The Unit Outcome: Progressing Patient is attending groups.  Comments:  Alena BillsGuthrie, Dejon Lukas A, RN

## 2014-10-24 NOTE — Progress Notes (Signed)
D: Patient is alert and oriented. Pt's mood and affect is suspicious, flat and silly at times, pt smiles at inappropriate times during conversation with RN. Pt's eye contact is brief and glaring at times. Pt's speech is soft and quiet, hard to hear at times. Pt denies depression, hopelessness, and anxiety. Pt denies SI/HI and AVH at this time but remains paranoid and interacts minimally with RN. Pt is observed pacing the halls slowly at times. Pt reports his goal for the day is "getting myself together, keep doing what I have to do, going to groups." Pt reported to RN that afternoon group was "boring," RN asked pt if he participated and he said he did, however per LCSW Heather's note, pt did not participate. RN attempted to communicate with pt this evening to see how his day has been going and pt refused to respond and avoided eye contact and continued pacing the halls with his hair covering his face. Pt request hygiene products. A: Encouragement/Support provided to pt. Active listening by RN. Hygiene products given to pt. 15 minute checks continued per protocol for patient safety.  R: Patient usually cooperative but not receptive to nursing interventions.

## 2014-10-24 NOTE — [facility] Group Notes (Signed)
[facility] Dis[facility] Planning [facility] Note   10/24/2014 10:11 AM  Participation Quality:  Minimal  Mood/Affect:  Flat  Depression Rating:  Denies  Anxiety Rating:  Denies  Thoughts of Suicide:  No Will you contract for safety?   NA  Current AVH:  Denies  Plan for Discharge/Comments:  Minimal interaction.  States his mother did not visit over the weekend, nor did she call.  Says he talked to his sister.  Sleep and appetite are both fine.  Transportation Means:  family  Supports: family  Kiribatiorth, [name]

## 2014-10-25 NOTE — [facility] Group Notes (Signed)
[facility] Group Notes:  (Counselor/Nursing/MHT/Case Management/Adjunct)  10/25/2014 1:15PM  Type of Therapy:  Group Therapy  Participation Level:  Active  Participation Quality:  Appropriate  Affect:  Flat  Cognitive:  Oriented  Insight:  Improving  Engagement in Group:  Limited  Engagement in Therapy:  Limited  Modes of Intervention:  Discussion, Exploration and Socialization  Summary of Progress/Problems: The topic for group was balance in life.  Pt participated in the discussion about when their life was in balance and out of balance and how this feels.  Pt discussed ways to get back in balance and short term goals they can work on to get where they want to be. "I feel better now than when I came in.  I think the medication is helping me."  Says that he thinks he'll take it when he leaves, but unable to identify any supports that might be able to help him.  Reluctant to engage any further.   Daryel Geraldorth, Elenor Wildes B 10/25/2014 10:59 AM

## 2014-10-25 NOTE — [facility] Group Notes (Signed)
Adult Psychoeducational Group Note  Date:  10/25/2014 Time:  9:59 PM  Group Topic/Focus:  Wrap-Up Group:   The focus of this group is to help patients review their daily goal of treatment and discuss progress on daily workbooks.  Participation Level:  Minimal  Participation Quality:  Attentive  Affect:  Flat  Cognitive:  Lacking  Insight: Limited  Engagement in Group:  Limited  Modes of Intervention:  Discussion  Additional Comments:  Zachary RuizJohn said his day was all right.  He is discharging on Thursday.  His goal was to get discharged.  He expressed that he is [name] gemini.  Zachary RancherLindsay, [name] 10/25/2014, 9:59 PM

## 2014-10-25 NOTE — Progress Notes (Signed)
Patient ID: Zachary LeavensJohn C [name], male   DOB: [birth year], 19 y.o.   MRN: [id number]   D: Pt stated, "I was wondering do I go home tomorrow".  Pt stated that he was  Supposed to go home but [facility]. Stated, "I don't know if I'm going home to stay there because they be lying so much", referring to his treatment team. Pt stated he was initially told he would be here 5 to 7 days but now was informed [facility] would [facility].  A:  Support and encouragement was offered. 15 min checks continued for safety.  R: Pt remains safe.

## 2014-10-25 NOTE — Progress Notes (Signed)
D: Patient has sad, depressed affect and mood. He reported on the self inventory sheet that he's sleeping fair at night, normal energy level and good appetite and ability to concentrate. Patient rates depression/feelings of hopelessness "0" and anxiety "3". Writer has observed that the patient has been pacing the hall at times, but outside of this he's either been sitting in the dayroom or resting in his room. No medications to be administered.   A: Support and encouragement provided to patient. Maintain Q15 minute checks for safety.  R: Patient receptive. Denies SI/HI and AVH. Patient remains safe on the hall.

## 2014-10-25 NOTE — Progress Notes (Signed)
Adult Psychoeducational Group Note  Date:  10/25/2014 Time:  9:00 AM  Group Topic/Focus:  Morning Wellness Group  Participation Level:  Did Not Attend  Additional Comments: Patient was in bed sleeping during this group session.  Harold BarbanByrd, Katlynne Mckercher E 10/25/2014, 11:30 AM

## 2014-10-25 NOTE — Progress Notes (Addendum)
Patient ID: [name], male   DOB: Sep 05, 1996, 19 y.o.   MRN: [id number] [facility] MD Progress Note  10/25/2014 11:53 AM [name]  MRN:  [id number] Subjective: Patient states "I want to go home."  Objective: Patient seen and chart reviewed.Patient discussed with nursing staff and treatment team .  Patient seems to be improving since admission. Patient is more interactive as well as appears to be less anxious . Patient has been attending group activities. Patient with complicated family dynamics, pt with low social support. Patient's affect and behavior completely changed after a phone conversation with mother.  Patient was reluctant to sign consent form earlier since he felt he was being transferred to another facility. However once patient was reassured and educated about after care appointments with an outpatient psychiatrist ,he signed it .  Per staff patient does have periods when he is seen pacing on the unit. When this was discussed with patient he reported being anxious about being discharged ,since he is being told everyday that he will be discharged soon but is still here.   Diagnosis:   DSM5: Primary Psychiatric Diagnosis: Unspecified schizophrenia spectrum and other psychotic disorder  R/O Cannabis-induced psychotic disorder     Secondary Psychiatric Diagnosis: Cannabis use disorder,moderate Alcohol use disorder,mild to moderate  Non Psychiatric Diagnosis: See PMH      Total Time spent with patient: 45 minutes   Past Medical History  Diagnosis Date  . ADHD (attention deficit hyperactivity disorder)    ADL's:  Intact  Sleep: Good  Appetite:  Good :  Psychiatric Specialty Exam: Physical Exam  Constitutional: He is oriented to person, place, and time.  HENT:  Head: Normocephalic.  Neck: Normal range of motion.  Respiratory: Effort normal.  Musculoskeletal: Normal range of motion.  Neurological: He is alert and oriented to person, place, and  time.  Skin: Skin is warm and dry.  Psychiatric: His speech is normal. His mood appears anxious. He is not slowed and not withdrawn. Thought content is not paranoid. Cognition and memory are normal. He expresses impulsivity. He does not exhibit a depressed mood. He expresses no homicidal and no suicidal ideation.    Review of Systems  Psychiatric/Behavioral: Depression: Denies. Hallucinations: Denies. Substance abuse: THC. Nervous/anxious: Denies. Insomnia: Denies.   All other systems reviewed and are negative.   Blood pressure 119/60, pulse 80, temperature 98.2 F (36.8 C), temperature source Oral, resp. rate 17, height  (1.803 m), weight 72.122 kg (159 lb).Body mass index is 22.19 kg/(m^2).  General Appearance: Casual and Fairly Groomed  Patent attorney::  Good  Speech:  Clear and Coherent  Volume:  Decreased  Mood:  Depressed improving  Affect:  Congruent  Thought Process:  Linear  Orientation:  Full (Time, Place, and Person)  Thought Content:  Paranoid Ideation, IMPROVING  Suicidal Thoughts:  No  Homicidal Thoughts:  No  Memory:  Immediate;   Fair Recent;   Fair Remote;   Fair  Judgement:  Poor  Insight:  Lacking  Psychomotor Activity:  Normal  Concentration:  Fair  Recall:  [employer] of Knowledge:Fair  Language: Fair  Akathisia:  No  Handed:  Right  AIMS (if indicated):     Assets:  Physical Health  Sleep:  Number of Hours: 1   Musculoskeletal: Strength & Muscle Tone: within normal limits Gait & Station: normal Patient leans: N/A  Current Medications: Current Facility-Administered Medications  Medication Dose Route Frequency Provider Last Rate Last Dose  . acetaminophen (TYLENOL) tablet  650 mg  650 mg Oral Q6H PRN Nanine MeansJamison Lord, NP      . alum & mag hydroxide-simeth (MAALOX/MYLANTA) 200-200-20 MG/5ML suspension 30 mL  30 mL Oral Q4H PRN Nanine MeansJamison Lord, NP      . benztropine (COGENTIN) tablet 0.5 mg  0.5 mg Oral QHS Fransisca KaufmannLaura Davis, NP   0.5 mg at 10/25/14 0158  .  haloperidol (HALDOL) tablet 2 mg  2 mg Oral QHS Fransisca KaufmannLaura Davis, NP   2 mg at 10/25/14 0158  . hydrOXYzine (ATARAX/VISTARIL) tablet 25 mg  25 mg Oral TID PRN Nanine MeansJamison Lord, NP      . magnesium hydroxide (MILK OF MAGNESIA) suspension 30 mL  30 mL Oral Daily PRN Nanine MeansJamison Lord, NP      . traZODone (DESYREL) tablet 100 mg  100 mg Oral QHS Nanine MeansJamison Lord, NP   100 mg at 10/20/14 2200    Lab Results: No results found for this or any previous visit (from the past 48 hour(s)).  Physical Findings: AIMS: Facial and Oral Movements Muscles of Facial Expression: None, normal Lips and Perioral Area: None, normal Jaw: None, normal Tongue: None, normal,Extremity Movements Upper (arms, wrists, hands, fingers): None, normal Lower (legs, knees, ankles, toes): None, normal, Trunk Movements Neck, shoulders, hips: None, normal, Overall Severity Severity of abnormal movements (highest score from questions above): None, normal Incapacitation due to abnormal movements: None, normal Patient's awareness of abnormal movements (rate only patient's report): No Awareness, Dental Status Current problems with teeth and/or dentures?: No Does patient usually wear dentures?: No  CIWA:    COWS:     Treatment Plan Summary: Daily contact with patient to assess and evaluate symptoms and progress in treatment Medication management  Assessment: Patient is a 19 year old AAM ,who presented after having ?paranoia as well as agitation at home. Patient's UDS positive for THC , also reports alcohol abuse. Patient denies abusing any other drugs and reports that his mother was talking to his little sister on the phone about taking his money and that upset him. However per CSW ,mother reports patient as very paranoid as well as agitated at home.Mother concerned if this is a primary psychiatric issues rather than 2/2 drug induced. Patient per review of past notes in EHR had similar visit to ED after he had gone out with friends for a while . At  that time also his UDS was positive for THC. Patient seems to be improving ,is more linear and goal directed ,not seen responding to internal stimuli.   Plan: Will continue Haldol 2 mg po qhs along with Cogentin 0.5 mg po qhs for EPS. Will continue Trazodone 100 mg po qhs for sleep. Vistaril prn for anxiety sx. Reorder TSH,lipid panel,hemoglobin A1C.   Medical Decision Making Problem Points:  Established problem, stable/improving (1), Review of last therapy session (1) and Review of psycho-social stressors (1) Data Points:  Review or order clinical lab tests (1) Review of medication regiment & side effects (2)  I certify that inpatient services furnished can reasonably be expected to improve the patient's condition.   Cameren Earnest, md 10/25/2014, 11:53 AM

## 2014-10-26 ENCOUNTER — Encounter (HOSPITAL_COMMUNITY): Payer: Self-pay | Admitting: Registered Nurse

## 2014-10-26 MED ORDER — HYDROXYZINE HCL 25 MG PO TABS: ORAL_TABLET | ORAL | Status: DC

## 2014-10-26 MED ORDER — TRAZODONE HCL 100 MG PO TABS: ORAL_TABLET | ORAL | Status: DC

## 2014-10-26 MED ORDER — HALOPERIDOL 2 MG PO TABS: ORAL_TABLET | ORAL | Status: DC

## 2014-10-26 MED ORDER — BENZTROPINE MESYLATE 0.5 MG PO TABS: ORAL_TABLET | ORAL | Status: DC

## 2014-10-26 NOTE — Progress Notes (Signed)
Discharge Note: Discharge instructions/prescriptions/medication samples and bus pass given to patient. Patient verbalized understanding of discharge instructions and prescriptions. Returned belongings to patient. Denies SI/HI/AVH. Patient d/c without incident to the lobby and transported by [facility] bus. 

## 2014-10-26 NOTE — Progress Notes (Signed)
D: Pt was still quiet and reserved with the Clinical research associatewriter. However, pt brightened upon approach. Writer asked pt about his day. Pt stated, "they say I'm going home tomorrow. Is he lying?"  Writer informed pt that if plans were made than the dr feels it's appropriate to discharge. Pt states he plans to stay with his brother "until he get's somewhere else to go".  When asked if he felt ready for discharge pt smiled and stated, "yes".  Pt voiced no other questions or concerns.   A:  Support and encouragement was offered. 15 min checks continued for safety.  R: Pt remains safe.

## 2014-10-26 NOTE — Tx Team (Signed)
  Interdisciplinary Treatment Plan Update   Date Reviewed:  10/26/2014  Time Reviewed:  2:51 PM  Progress in Treatment:   Attending groups: Yes Participating in groups: Yes Taking medication as prescribed: Yes  Tolerating medication: Yes Family/Significant other contact made: Yes  Patient understands diagnosis: Yes  Discussing patient identified problems/goals with staff: Yes  See initial care plan Medical problems stabilized or resolved: Yes Denies suicidal/homicidal ideation: Yes  In tx team Patient has not harmed self or others: Yes  For review of initial/current patient goals, please see plan of care.  Estimated Length of Stay:  D/C today  Reason for Continuation of Hospitalization:   New Problems/Goals identified:  N/A  Discharge Plan or Barriers:   return home, follow up outpt  Additional Comments:  Attendees:  Signature: Ivin BootySarama Eappen, MD 10/26/2014 2:51 PM   Signature: Richelle Itood Dorraine Ellender, LCSW 10/26/2014 2:51 PM  Signature:  10/26/2014 2:51 PM  Signature:  10/26/2014 2:51 PM  Signature: Liborio NixonPatrice White, RN 10/26/2014 2:51 PM  Signature:  10/26/2014 2:51 PM  Signature:   10/26/2014 2:51 PM  Signature:    Signature:    Signature:    Signature:    Signature:    Signature:      Scribe for Treatment Team:   Richelle Itood Onetta Spainhower, LCSW  10/26/2014 2:51 PM

## 2014-10-26 NOTE — [facility] Suicide Risk Assessment (Signed)
   Demographic Factors:  Male, Low socioeconomic status and Unemployed  Total Time spent with patient: 45 minutes  Psychiatric Specialty Exam: Physical Exam  ROS  Blood pressure 136/72, pulse 78, temperature 98.2 F (36.8 C), temperature source Oral, resp. rate 14, height 5\' 11"  (1.803 m), weight 72.122 kg (159 lb).Body mass index is 22.19 kg/(m^2).  General Appearance: Casual  Eye Contact::  Fair  Speech:  Clear and Coherent  Volume:  Normal  Mood:  Euthymic  Affect:  Congruent  Thought Process:  Coherent  Orientation:  Full (Time, Place, and Person)  Thought Content:  WDL  Suicidal Thoughts:  No  Homicidal Thoughts:  No  Memory:  Immediate;   Fair Recent;   Fair Remote;   Fair  Judgement:  Fair  Insight:  Fair  Psychomotor Activity:  Normal  Concentration:  Fair  Recall:  [employer]Fair  Fund of Knowledge:Fair  Language: Fair  Akathisia:  No  Handed:  Right  AIMS (if indicated):     Assets:  Desire for Improvement Physical Health  Sleep:  Number of Hours: 3.5    Musculoskeletal: Strength & Muscle Tone: within normal limits Gait & Station: normal Patient leans: N/A   Mental Status Per Nursing Assessment::   On Admission:  NA  Current Mental Status by Physician: pt denies SI/HI/AH/VH  Loss Factors: RELATIONAL TROUBLES  Historical Factors: Impulsivity  Risk Reduction Factors:   Living with another person, especially a relative  Continued Clinical Symptoms:  Alcohol/Substance Abuse/Dependencies  Cognitive Features That Contribute To Risk:  Polarized thinking    Suicide Risk:  Minimal: No identifiable suicidal ideation.  Patients presenting with no risk factors but with morbid ruminations; may be classified as minimal risk based on the severity of the depressive symptoms  Discharge Diagnoses:  DSM5: Primary Psychiatric Diagnosis:  Cannabis-induced psychotic disorder (RESOLVED)     Secondary Psychiatric Diagnosis: Cannabis use  disorder,moderate Alcohol use disorder,mild to moderate  Non Psychiatric Diagnosis: See PMH  Past Medical History  Diagnosis Date  . ADHD (attention deficit hyperactivity disorder)     Plan Of Care/Follow-up recommendations:  Activity:  no restrictions Diet:  regular  Is patient on multiple antipsychotic therapies at discharge:  No   Has Patient had three or more failed trials of antipsychotic monotherapy by history:  No  Recommended Plan for Multiple Antipsychotic Therapies: NA    Breindy Meadow MD 10/26/2014, 8:36 AM

## 2014-10-26 NOTE — Progress Notes (Signed)
[facility] :  Will you be returning to the same living situation after discharge: No. Says he will stay with brother At discharge, do you have transportation home?:Yes,  bus pass Do you have the ability to pay for your medications:Yes,  mental health  Release of information consent forms completed and in the chart;  Patient's signature needed at discharge.  Patient to Follow up at: Follow-up Information    [facility]   Why:  Go to the walk-in clinic M-F between 8 and 11 AM for your hospital follow up appointment   Contact information:   [address]  [place]  [phone]      Patient denies SI/HI:   Yes,  yes    Safety Planning and Suicide Prevention discussed:  Yes,  yes  Yes, faxed on 10/26/14  [name], [name] 10/26/2014, 3:02 PM
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
## 2014-10-28 NOTE — Progress Notes (Signed)
Patient Discharge Instructions:  After Visit Summary (AVS):   Faxed to:  10/28/14 Discharge Summary Note:   Faxed to:  10/28/14 Psychiatric Admission Assessment Note:   Faxed to:  10/28/14 Suicide Risk Assessment - Discharge Assessment:   Faxed to:  10/28/14 Faxed/Sent to the Next Level Care provider:  10/28/14 [facility] @ [phone]  Jerelene ReddenSheena E [place], 10/28/2014, 3:55 PM

## 2014-11-08 ENCOUNTER — Emergency Department (HOSPITAL_COMMUNITY)
Admission: EM | Admit: 2014-11-08 | Discharge: 2014-11-08 | Discharge status: Against Medical Advice | Payer: Medicaid Other | Attending: Emergency Medicine | Admitting: Emergency Medicine

## 2014-11-08 ENCOUNTER — Encounter (HOSPITAL_COMMUNITY): Payer: Self-pay | Admitting: *Deleted

## 2014-11-08 DIAGNOSIS — Z720 Tobacco use: Secondary | ICD-10-CM | POA: Insufficient documentation

## 2014-11-08 DIAGNOSIS — Z5321 Procedure and treatment not carried out due to patient leaving prior to being seen by health care provider: Secondary | ICD-10-CM

## 2014-11-08 DIAGNOSIS — L509 Urticaria, unspecified: Secondary | ICD-10-CM | POA: Insufficient documentation

## 2014-11-08 NOTE — ED Notes (Signed)
Pt walked into fasttrack room and decided that he did not want to be seen.

## 2014-11-08 NOTE — ED Notes (Signed)
Pt requesting to be checked, reports that he keeps getting a rash/hives but does not have hives at this time. No distress noted at this time, denies any other complaints.

## 2014-11-08 NOTE — ED Notes (Signed)
Vitals, height and weight taken by Crystal, Phelb 

## 2014-11-10 NOTE — ED Provider Notes (Signed)
LWBS on 11/08/13.   Clinical Impression 1. Patient left without being seen      Purvis SheffieldForrest Rosary Filosa, MD 11/10/14 1217

## 2015-05-29 ENCOUNTER — Emergency Department (HOSPITAL_COMMUNITY)
Admission: EM | Admit: 2015-05-29 | Discharge: 2015-05-29 | Discharge status: Against Medical Advice | Payer: Medicaid Other | Attending: Emergency Medicine | Admitting: Emergency Medicine

## 2015-05-29 ENCOUNTER — Emergency Department (HOSPITAL_COMMUNITY)
Admission: EM | Admit: 2015-05-29 | Discharge: 2015-05-29 | Disposition: A | Discharge status: Home / Self Care | Payer: No Typology Code available for payment source | Attending: Emergency Medicine | Admitting: Emergency Medicine

## 2015-05-29 ENCOUNTER — Encounter (HOSPITAL_COMMUNITY): Payer: Self-pay | Admitting: *Deleted

## 2015-05-29 DIAGNOSIS — Z79899 Other long term (current) drug therapy: Secondary | ICD-10-CM | POA: Insufficient documentation

## 2015-05-29 DIAGNOSIS — Z720 Tobacco use: Secondary | ICD-10-CM | POA: Insufficient documentation

## 2015-05-29 DIAGNOSIS — S43402A Unspecified sprain of left shoulder joint, initial encounter: Secondary | ICD-10-CM | POA: Diagnosis not present

## 2015-05-29 DIAGNOSIS — Y9389 Activity, other specified: Secondary | ICD-10-CM | POA: Diagnosis not present

## 2015-05-29 DIAGNOSIS — Y998 Other external cause status: Secondary | ICD-10-CM | POA: Insufficient documentation

## 2015-05-29 DIAGNOSIS — F909 Attention-deficit hyperactivity disorder, unspecified type: Secondary | ICD-10-CM | POA: Insufficient documentation

## 2015-05-29 DIAGNOSIS — Z8659 Personal history of other mental and behavioral disorders: Secondary | ICD-10-CM | POA: Insufficient documentation

## 2015-05-29 DIAGNOSIS — Y92410 Unspecified street and highway as the place of occurrence of the external cause: Secondary | ICD-10-CM | POA: Diagnosis not present

## 2015-05-29 DIAGNOSIS — S4992XA Unspecified injury of left shoulder and upper arm, initial encounter: Secondary | ICD-10-CM | POA: Diagnosis present

## 2015-05-29 DIAGNOSIS — M25512 Pain in left shoulder: Secondary | ICD-10-CM

## 2015-05-29 MED ORDER — IBUPROFEN 400 MG PO TABS
800.0000 mg | ORAL_TABLET | Freq: Once | ORAL | Status: AC
  Administered 2015-05-29: 800 mg via ORAL
  Filled 2015-05-29: qty 2

## 2015-05-29 NOTE — ED Notes (Signed)
Pt's name called for triage no answer 

## 2015-05-29 NOTE — ED Notes (Signed)
Patient was seen by this nurse reaching behind himself  Pulling his pants up. Patient then came to the nurse station and asked where is sister was in room Six. This nurse explained to patient she was in the lobby. Patient then pull the strings of his sweat shirt.  outward  And stated he was leaving.
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
## 2015-05-31 ENCOUNTER — Emergency Department (HOSPITAL_COMMUNITY): Payer: No Typology Code available for payment source

## 2015-05-31 ENCOUNTER — Emergency Department (HOSPITAL_COMMUNITY)
Admission: EM | Admit: 2015-05-31 | Discharge: 2015-05-31 | Disposition: A | Discharge status: Home / Self Care | Payer: No Typology Code available for payment source | Attending: Emergency Medicine | Admitting: Emergency Medicine

## 2015-05-31 ENCOUNTER — Encounter (HOSPITAL_COMMUNITY): Payer: Self-pay | Admitting: *Deleted

## 2015-05-31 DIAGNOSIS — Y92410 Unspecified street and highway as the place of occurrence of the external cause: Secondary | ICD-10-CM | POA: Diagnosis not present

## 2015-05-31 DIAGNOSIS — S4992XA Unspecified injury of left shoulder and upper arm, initial encounter: Secondary | ICD-10-CM | POA: Insufficient documentation

## 2015-05-31 DIAGNOSIS — Z8659 Personal history of other mental and behavioral disorders: Secondary | ICD-10-CM | POA: Insufficient documentation

## 2015-05-31 DIAGNOSIS — Z720 Tobacco use: Secondary | ICD-10-CM | POA: Insufficient documentation

## 2015-05-31 DIAGNOSIS — Y998 Other external cause status: Secondary | ICD-10-CM | POA: Insufficient documentation

## 2015-05-31 DIAGNOSIS — Z79899 Other long term (current) drug therapy: Secondary | ICD-10-CM | POA: Insufficient documentation

## 2015-05-31 DIAGNOSIS — Y9389 Activity, other specified: Secondary | ICD-10-CM | POA: Diagnosis not present

## 2015-05-31 DIAGNOSIS — M25512 Pain in left shoulder: Secondary | ICD-10-CM

## 2015-05-31 NOTE — ED Notes (Signed)
Attempted to start IV for pain management/possible conscious sedation, pt refused.

## 2015-05-31 NOTE — ED Notes (Signed)
Pt left at this time with all belongings.  
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
## 2015-05-31 NOTE — Discharge Instructions (Signed)
Please use ice, ibuprofen, or Tylenol as needed for pain. Please use reconstructions and complete shoulder exercises, please follow-up with your primary care provider for reevaluation if symptoms continue to persist.

## 2015-06-02 ENCOUNTER — Encounter (HOSPITAL_COMMUNITY): Payer: Self-pay | Admitting: Emergency Medicine

## 2015-06-02 ENCOUNTER — Emergency Department (HOSPITAL_COMMUNITY)
Admission: EM | Admit: 2015-06-02 | Discharge: 2015-06-05 | Disposition: A | Discharge status: Other Acute Inpatient Hospital | Payer: Federal, State, Local not specified - Other | Attending: Emergency Medicine | Admitting: Emergency Medicine

## 2015-06-02 DIAGNOSIS — F1994 Other psychoactive substance use, unspecified with psychoactive substance-induced mood disorder: Secondary | ICD-10-CM | POA: Diagnosis present

## 2015-06-02 DIAGNOSIS — Z046 Encounter for general psychiatric examination, requested by authority: Secondary | ICD-10-CM

## 2015-06-02 DIAGNOSIS — F1914 Other psychoactive substance abuse with psychoactive substance-induced mood disorder: Secondary | ICD-10-CM | POA: Insufficient documentation

## 2015-06-02 DIAGNOSIS — R45850 Homicidal ideations: Secondary | ICD-10-CM

## 2015-06-02 DIAGNOSIS — Z720 Tobacco use: Secondary | ICD-10-CM | POA: Insufficient documentation

## 2015-06-02 DIAGNOSIS — Z79899 Other long term (current) drug therapy: Secondary | ICD-10-CM | POA: Insufficient documentation

## 2015-06-02 DIAGNOSIS — F1210 Cannabis abuse, uncomplicated: Secondary | ICD-10-CM | POA: Insufficient documentation
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
## 2015-06-03 DIAGNOSIS — F29 Unspecified psychosis not due to a substance or known physiological condition: Secondary | ICD-10-CM | POA: Insufficient documentation

## 2015-06-03 DIAGNOSIS — F1994 Other psychoactive substance use, unspecified with psychoactive substance-induced mood disorder: Secondary | ICD-10-CM | POA: Diagnosis present

## 2015-06-03 LAB — RAPID URINE DRUG SCREEN, HOSP PERFORMED
Amphetamines: NOT DETECTED
Barbiturates: NOT DETECTED
Benzodiazepines: NOT DETECTED
Cocaine: NOT DETECTED
Opiates: NOT DETECTED
Tetrahydrocannabinol: POSITIVE — AB

## 2015-06-03 LAB — COMPREHENSIVE METABOLIC PANEL
ALT: 21 U/L (ref 17–63)
AST: 37 U/L (ref 15–41)
Albumin: 5 g/dL (ref 3.5–5.0)
Alkaline Phosphatase: 51 U/L (ref 38–126)
Anion gap: 11 (ref 5–15)
BILIRUBIN TOTAL: 0.6 mg/dL (ref 0.3–1.2)
BUN: 17 mg/dL (ref 6–20)
CO2: 21 mmol/L — ABNORMAL LOW (ref 22–32)
CREATININE: 1.04 mg/dL (ref 0.61–1.24)
Calcium: 9.4 mg/dL (ref 8.9–10.3)
Chloride: 108 mmol/L (ref 101–111)
GFR calc Af Amer: >60 mL/min (ref >60)
GFR calc non Af Amer: >60 mL/min (ref >60)
Glucose, Bld: 99 mg/dL (ref 65–99)
Potassium: 3.5 mmol/L (ref 3.5–5.1)
Sodium: 140 mmol/L (ref 135–145)
Total Protein: 7.5 g/dL (ref 6.5–8.1)

## 2015-06-03 LAB — CBC
HCT: 42.2 % (ref 39.0–52.0)
Hemoglobin: 14.4 g/dL (ref 13.0–17.0)
MCH: 31.4 pg (ref 26.0–34.0)
MCHC: 34.1 g/dL (ref 30.0–36.0)
MCV: 91.9 fL (ref 78.0–100.0)
PLATELETS: 252 10*3/uL (ref 150–400)
RBC: 4.59 MIL/uL (ref 4.22–5.81)
RDW: 13.3 % (ref 11.5–15.5)
WBC: 12 10*3/uL — ABNORMAL HIGH (ref 4.0–10.5)

## 2015-06-03 LAB — SALICYLATE LEVEL: Salicylate Lvl: <4 mg/dL (ref 2.8–30.0)

## 2015-06-03 LAB — ETHANOL: Alcohol, Ethyl (B): <5 mg/dL (ref <5)

## 2015-06-03 LAB — ACETAMINOPHEN LEVEL: Acetaminophen (Tylenol), Serum: <10 ug/mL — ABNORMAL LOW (ref 10–30)

## 2015-06-03 MED ORDER — BENZTROPINE MESYLATE 1 MG PO TABS
0.5000 mg | ORAL_TABLET | Freq: Two times a day (BID) | ORAL | Status: DC
  Administered 2015-06-03 – 2015-06-04 (×3): 0.5 mg via ORAL
  Filled 2015-06-03 (×4): qty 1

## 2015-06-03 MED ORDER — TRAZODONE HCL 50 MG PO TABS
50.0000 mg | ORAL_TABLET | Freq: Every day | ORAL | Status: DC
  Administered 2015-06-03 – 2015-06-04 (×2): 50 mg via ORAL
  Filled 2015-06-03 (×2): qty 1

## 2015-06-03 MED ORDER — ALUM & MAG HYDROXIDE-SIMETH 200-200-20 MG/5ML PO SUSP: 30.0000 mL | ORAL | Status: DC | PRN

## 2015-06-03 MED ORDER — HALOPERIDOL 2 MG PO TABS
2.0000 mg | ORAL_TABLET | Freq: Two times a day (BID) | ORAL | Status: DC
  Administered 2015-06-03 – 2015-06-04 (×3): 2 mg via ORAL
  Filled 2015-06-03 (×3): qty 1

## 2015-06-03 MED ORDER — ZOLPIDEM TARTRATE 5 MG PO TABS: 5.0000 mg | ORAL_TABLET | Freq: Every evening | ORAL | Status: DC | PRN

## 2015-06-03 MED ORDER — ONDANSETRON HCL 4 MG PO TABS: 4.0000 mg | ORAL_TABLET | Freq: Three times a day (TID) | ORAL | Status: DC | PRN

## 2015-06-03 MED ORDER — IBUPROFEN 200 MG PO TABS: 600.0000 mg | ORAL_TABLET | Freq: Three times a day (TID) | ORAL | Status: DC | PRN

## 2015-06-03 MED ORDER — ACETAMINOPHEN 325 MG PO TABS: 650.0000 mg | ORAL_TABLET | ORAL | Status: DC | PRN

## 2015-06-03 NOTE — ED Notes (Signed)
Provided pt with [place] sandwich, cheese, and applesauce.

## 2015-06-03 NOTE — ED Notes (Signed)
[employer] leaving bedside. Will continue to monitor.
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
## 2015-06-03 NOTE — ED Notes (Signed)
[facility] declined for substances abuse.
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
## 2015-06-03 NOTE — ED Notes (Signed)
Pt sleeping at present, no distress noted, calm at present,  Monitoring for safety, Q 15 min checks in effect.

## 2015-06-03 NOTE — ED Notes (Signed)
Bed: WBH42 Expected date:  Expected time:  Means of arrival:  Comments: T4 
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
## 2015-06-03 NOTE — ED Notes (Signed)
gpd  At bedside
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
## 2015-06-03 NOTE — Progress Notes (Signed)
Disposition CSW completed patient referrals to the following inpatient psych facilities:  [facility]  CSW will continue to assist with placement needs.   Seward Speck [facility] Behavioral Health Disposition CSW [phone]

## 2015-06-04 NOTE — BH Assessment (Signed)
Patient was reassessed on 06/04/2015:   Patient was oriented to person but could not state his birthday. Patient mumbled an was unclear. Patient appeared to be responding to internal stimuli. Patient answered questions non-verbally or did not answer the questions. Patient shook his head "no" to being suicidal, homicidal, and psychotic. Patient indicates that he does not know why he is here bu shrugging his shoulders when asked what led to him being seen at [facility].   Inpatient is recommended by Dr. Lolly Mustache and Dahlia Byes, NP - [address].  Davina Poke, LCSW Therapeutic Triage Specialist [facility] 06/04/2015 1:24 PM

## 2015-06-04 NOTE — ED Notes (Signed)
Pt awake, alert & responsive, no distress noted, calm and guarded.  Monitoring for safety, Q 15 min checks in effect.

## 2015-06-05 ENCOUNTER — Encounter (HOSPITAL_COMMUNITY): Payer: Self-pay | Admitting: *Deleted

## 2015-06-05 ENCOUNTER — Inpatient Hospital Stay (HOSPITAL_COMMUNITY)
Admission: AD | Admit: 2015-06-05 | Discharge: 2015-06-12 | DRG: 897 | Disposition: A | Discharge status: Home / Self Care | Payer: Federal, State, Local not specified - Other | Attending: Psychiatry | Admitting: Psychiatry

## 2015-06-05 DIAGNOSIS — F12251 Cannabis dependence with psychotic disorder with hallucinations: Secondary | ICD-10-CM | POA: Diagnosis present

## 2015-06-05 DIAGNOSIS — F24 Shared psychotic disorder: Secondary | ICD-10-CM | POA: Diagnosis present

## 2015-06-05 DIAGNOSIS — F29 Unspecified psychosis not due to a substance or known physiological condition: Secondary | ICD-10-CM | POA: Diagnosis not present

## 2015-06-05 DIAGNOSIS — F1220 Cannabis dependence, uncomplicated: Secondary | ICD-10-CM

## 2015-06-05 DIAGNOSIS — F17210 Nicotine dependence, cigarettes, uncomplicated: Secondary | ICD-10-CM | POA: Diagnosis present

## 2015-06-05 DIAGNOSIS — Z818 Family history of other mental and behavioral disorders: Secondary | ICD-10-CM

## 2015-06-05 DIAGNOSIS — F12129 Cannabis abuse with intoxication, unspecified: Secondary | ICD-10-CM | POA: Diagnosis not present

## 2015-06-05 DIAGNOSIS — F1994 Other psychoactive substance use, unspecified with psychoactive substance-induced mood disorder: Secondary | ICD-10-CM | POA: Diagnosis present

## 2015-06-05 DIAGNOSIS — F1210 Cannabis abuse, uncomplicated: Secondary | ICD-10-CM | POA: Insufficient documentation

## 2015-06-05 DIAGNOSIS — F12120 Cannabis abuse with intoxication, uncomplicated: Secondary | ICD-10-CM | POA: Diagnosis not present

## 2015-06-05 MED ORDER — ACETAMINOPHEN 325 MG PO TABS
650.0000 mg | ORAL_TABLET | Freq: Four times a day (QID) | ORAL | Status: DC | PRN
  Administered 2015-06-10: 650 mg via ORAL
  Filled 2015-06-05: qty 2

## 2015-06-05 MED ORDER — ALUM & MAG HYDROXIDE-SIMETH 200-200-20 MG/5ML PO SUSP: 30.0000 mL | ORAL | Status: DC | PRN

## 2015-06-05 MED ORDER — MAGNESIUM HYDROXIDE 400 MG/5ML PO SUSP: 30.0000 mL | Freq: Every day | ORAL | Status: DC | PRN

## 2015-06-05 MED ORDER — BENZTROPINE MESYLATE 0.5 MG PO TABS
0.5000 mg | ORAL_TABLET | Freq: Two times a day (BID) | ORAL | Status: DC
  Administered 2015-06-05 – 2015-06-06 (×2): 0.5 mg via ORAL
  Filled 2015-06-05 (×6): qty 1

## 2015-06-05 MED ORDER — ARIPIPRAZOLE 5 MG PO TABS
5.0000 mg | ORAL_TABLET | Freq: Two times a day (BID) | ORAL | Status: DC
  Filled 2015-06-05 (×2): qty 1

## 2015-06-05 MED ORDER — IBUPROFEN 600 MG PO TABS
600.0000 mg | ORAL_TABLET | Freq: Three times a day (TID) | ORAL | Status: DC | PRN
  Administered 2015-06-09 – 2015-06-11 (×3): 600 mg via ORAL
  Filled 2015-06-05 (×3): qty 1

## 2015-06-05 MED ORDER — ARIPIPRAZOLE 5 MG PO TABS
5.0000 mg | ORAL_TABLET | Freq: Two times a day (BID) | ORAL | Status: DC
  Administered 2015-06-06: 5 mg via ORAL
  Filled 2015-06-05 (×4): qty 1

## 2015-06-05 MED ORDER — TRAZODONE HCL 50 MG PO TABS
50.0000 mg | ORAL_TABLET | Freq: Every day | ORAL | Status: DC
  Administered 2015-06-05 – 2015-06-11 (×6): 50 mg via ORAL
  Filled 2015-06-05 (×6): qty 1
  Filled 2015-06-05: qty 7
  Filled 2015-06-05 (×3): qty 1

## 2015-06-05 MED ORDER — ARIPIPRAZOLE 5 MG PO TABS
5.0000 mg | ORAL_TABLET | Freq: Two times a day (BID) | ORAL | Status: DC
  Filled 2015-06-05 (×3): qty 1

## 2015-06-05 MED ORDER — HYDROXYZINE HCL 25 MG PO TABS: 25.0000 mg | ORAL_TABLET | Freq: Three times a day (TID) | ORAL | Status: DC | PRN

## 2015-06-05 MED ORDER — ONDANSETRON HCL 4 MG PO TABS: 4.0000 mg | ORAL_TABLET | Freq: Three times a day (TID) | ORAL | Status: DC | PRN

## 2015-06-05 MED ORDER — HYDROXYZINE HCL 25 MG PO TABS
25.0000 mg | ORAL_TABLET | Freq: Three times a day (TID) | ORAL | Status: DC | PRN
  Filled 2015-06-05: qty 10

## 2015-06-05 MED ORDER — ACETAMINOPHEN 325 MG PO TABS: 650.0000 mg | ORAL_TABLET | ORAL | Status: DC | PRN

## 2015-06-05 NOTE — BH Assessment (Signed)
[facility] Assessment Progress Note  Per Thedore Mins, MD, this pt requires psychiatric hospitalization at this time.  Thurman Coyer, RN, [facility] has assigned pt to Rm [id number].  Pt is under IVC, and commitment documents have been faxed to [facility].  Pt's nurse, Rudean Hitt, has been notified and agrees to contact [employer] for transportation, and to call report to [phone].  Doylene Canning, MA Triage Specialist [phone]

## 2015-06-05 NOTE — Tx Team (Signed)
Initial Interdisciplinary Treatment Plan   PATIENT STRESSORS: Medication change or noncompliance   PATIENT STRENGTHS: General fund of knowledge Physical Health   PROBLEM LIST: Problem List/Patient Goals Date to be addressed Date deferred Reason deferred Estimated date of resolution  psychosis 06/05/15                                                      DISCHARGE CRITERIA:  Ability to meet basic life and health needs Adequate post-discharge living arrangements Improved stabilization in mood, thinking, and/or behavior Motivation to continue treatment in a less acute level of care Need for constant or close observation no longer present Reduction of life-threatening or endangering symptoms to within safe limits Safe-care adequate arrangements made Verbal commitment to aftercare and medication compliance  PRELIMINARY DISCHARGE PLAN: Outpatient therapy Return to previous living arrangement  PATIENT/FAMIILY INVOLVEMENT: This treatment plan has been presented to and reviewed with the patient, [name], and/or family member,   The patient and family have been given the opportunity to ask questions and make suggestions.  Beatrix Shipper 06/05/2015, 6:56 PM

## 2015-06-05 NOTE — Progress Notes (Signed)
Pt admitted involuntary with psychosis. Pt has been non compliant with his medications and per report did not take medications in the ED. Pt is guarded on admission and required police and [facility] to help get pt on the unit as pt tried to leave during admission. Pt denies any medical hx. Per report pt has been responding to internal stimuli talking to the TV. Pt is guarded and anxious on admission asking how long he had to stay. He refused the alcohol screening.
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
## 2015-06-05 NOTE — ED Notes (Signed)
Patient transferred to [facility]  Left the unit ambulatory with [employer].  All belongings given to the officers.  Patient was encouraged to shower prior to leaving.  He went into the bathroom and came out with a pan of water and brought it to his room.  He did put on some clean scrubs, but it is unclear if he really bathed.

## 2015-06-05 NOTE — Progress Notes (Addendum)
D: Patient is alert and oriented x 4. Patient denies pain SI/HI/AVH. Patient very guarded during assessment. Patient spoke very softly. Patient was seen a few time pacing in day room and hallway. Patient asked this Clinical research associate, "When can I leave?"  This Clinical research associate responded by telling patient he could speak with his doctor in the morning. Patient questioned his medications but was compliant.  A: Staff to monitor Q 15 mins for safety. Encouragement and support offered. Scheduled medications administered per orders. R: Patient remains safe on the unit. Patient attended group tonight. Patient visible on hte unit and interacting with peers. Patient taking administered medications.

## 2015-06-06 ENCOUNTER — Encounter (HOSPITAL_COMMUNITY): Payer: Self-pay | Admitting: Psychiatry

## 2015-06-06 DIAGNOSIS — F12251 Cannabis dependence with psychotic disorder with hallucinations: Secondary | ICD-10-CM | POA: Diagnosis present

## 2015-06-06 DIAGNOSIS — F12129 Cannabis abuse with intoxication, unspecified: Secondary | ICD-10-CM

## 2015-06-06 MED ORDER — RISPERIDONE 1 MG PO TABS
1.0000 mg | ORAL_TABLET | Freq: Every day | ORAL | Status: DC
  Administered 2015-06-06: 1 mg via ORAL
  Filled 2015-06-06 (×3): qty 1

## 2015-06-06 MED ORDER — MAGNESIUM CITRATE PO SOLN: 1.0000 | Freq: Once | ORAL | Status: DC

## 2015-06-06 MED ORDER — BENZTROPINE MESYLATE 0.5 MG PO TABS
0.5000 mg | ORAL_TABLET | Freq: Every day | ORAL | Status: DC
  Administered 2015-06-06 – 2015-06-09 (×4): 0.5 mg via ORAL
  Filled 2015-06-06 (×7): qty 1

## 2015-06-06 MED ORDER — POLYETHYLENE GLYCOL 3350 17 G PO PACK: 17.0000 g | PACK | Freq: Every day | ORAL | Status: DC

## 2015-06-06 MED ORDER — LAMOTRIGINE 25 MG PO TABS
25.0000 mg | ORAL_TABLET | Freq: Every day | ORAL | Status: DC
  Administered 2015-06-06 – 2015-06-11 (×6): 25 mg via ORAL
  Filled 2015-06-06 (×9): qty 1

## 2015-06-06 MED ORDER — POLYETHYLENE GLYCOL 3350 17 G PO PACK
17.0000 g | PACK | Freq: Two times a day (BID) | ORAL | Status: DC
  Filled 2015-06-06 (×16): qty 1

## 2015-06-06 MED ORDER — POLYETHYLENE GLYCOL 3350 17 G PO PACK
17.0000 g | PACK | Freq: Once | ORAL | Status: AC
  Administered 2015-06-06: 17 g via ORAL
  Filled 2015-06-06 (×2): qty 1

## 2015-06-06 NOTE — Plan of Care (Signed)
Problem: Alteration in thought process Goal: LTG-Patient verbalizes understanding importance med regimen (Patient verbalizes understanding of importance of medication regimen and need to continue outpatient care.)  Outcome: Progressing Reviewed new medications and reminded him of the importance of taking his medication.  He took his medications with some encouragement.
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
## 2015-06-06 NOTE — Progress Notes (Signed)
DAR Note: Elwyn was pleasant and cooperative.  Alert and oriented.  He denies any SI/HI or A/V hallucinations.  He completed his self inventory and rated his depression 7/10, hopelessness 0/10 and his anxiety 8/10.  He reported pain 3/10 on his self inventory but when asked during morning medication he stated he was in no pain.  Reviewed new medication orders and he voiced no issues or question at this time stating "the doctor already talked to me about them."  He attended group with minimal interaction.  He appears to be in no physical distress.  He reported that he hasn't had a bowel movement in 3 days and was given new medication for constipation.  He had difficulty getting the drink down but Clinical research associate encouraged him and he was able to finish.  EKG completed per MD order. Q 15 minute checks maintained for safety and continue to encouraged participation in group and unit activities.

## 2015-06-06 NOTE — [facility] Group Notes (Signed)
[facility] Group Notes:  (Nursing/MHT/Case Management/Adjunct)  Date:  06/06/2015  Time:  11:43 AM  Type of Therapy:  Group Therapy  Participation Level:  Minimal  Participation Quality:  Appropriate and Attentive  Affect:  Blunted  Cognitive:  Appropriate  Insight:  Lacking  Engagement in Group:  Limited  Modes of Intervention:  Discussion and Education  Summary of Progress/Problems:  Purpose of the group was recovery and goals. Discussed the importance of setting appropriate and measurable goals. Reviewed sleep hygiene. [name] was pleasant and briefly answered questions during the group.   Norm Parcel Rowen Hur 06/06/2015, 11:43 AM

## 2015-06-06 NOTE — [facility] Group Notes (Signed)
[facility] LCSW Group Therapy  06/06/2015 , 12:41 PM   Type of Therapy:  Group Therapy  Participation Level:  Active  Participation Quality:  Attentive  Affect:  Appropriate  Cognitive:  Alert  Insight:  Improving  Engagement in Therapy:  Engaged  Modes of Intervention:  Discussion, Exploration and Socialization  Summary of Progress/Problems: Today's group focused on the term Diagnosis.  Participants were asked to define the term, and then pronounce whether it is a negative, positive or neutral term.  Stayed for three quarters of the group.  Dreads hanging over his eyes making it difficult to ascertain if he was paying attention or not.  Appeared to be laughing to self several times.  No participation in discussion, even when invited in.  Ida Rogue 06/06/2015 , 12:41 PM

## 2015-06-06 NOTE — [facility] Suicide Risk Assessment (Signed)
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 Cannabis use disorder, severe, dependence [F12.20] 10/21/2014     Continued Clinical Symptoms:  Alcohol Use Disorder Identification Test Final Score (AUDIT): 0 The "Alcohol Use Disorders Identification Test", Guidelines for Use in Primary Care, Second Edition.  World Science writer [facility]). Score between 0-7:  no or low risk or alcohol related problems. Score between 8-15:  moderate risk of alcohol related problems. Score between 16-19:  high risk of alcohol related problems. Score 20 or above:  warrants further diagnostic evaluation for alcohol dependence and treatment.   CLINICAL FACTORS:   Alcohol/Substance Abuse/Dependencies Currently Psychotic Unstable or Poor Therapeutic Relationship Previous Psychiatric Diagnoses and Treatments   Musculoskeletal: Strength & Muscle Tone: within normal limits Gait & Station: normal Patient leans: N/A  Psychiatric Specialty Exam: Physical Exam  Review of Systems  Gastrointestinal: Positive for nausea, abdominal pain and constipation.  Psychiatric/Behavioral: Positive for depression and substance abuse. The patient is nervous/anxious and has insomnia.   All other systems reviewed and are negative.   Blood pressure 140/81, pulse 63, temperature 98.6 F (37 C), temperature source Oral, resp. rate 16, height 6' (1.829 m), weight 83.008 kg (183  lb).Body mass index is 24.81 kg/(m^2).                Please see H&P.                                          COGNITIVE FEATURES THAT CONTRIBUTE TO RISK:  Closed-mindedness, Polarized thinking and Thought constriction (tunnel vision)    SUICIDE RISK:   Mild:  Suicidal ideation of limited frequency, intensity, duration, and specificity.  There are no identifiable plans, no associated intent, mild dysphoria and related symptoms, good self-control (both objective and subjective assessment), few other risk factors, and identifiable protective factors, including available and accessible social support.  PLAN OF CARE:Please see H&P.     Medical Decision Making:  Review of Psycho-Social Stressors (1), Review or order clinical lab tests (1), Review of Last Therapy Session (1), Review of Medication Regimen & Side Effects (2) and Review of New Medication or Change in Dosage (2)  I certify that inpatient services furnished can reasonably be expected to improve the patient's condition.   Javana Schey MD 06/06/2015, 11:37 AM

## 2015-06-07 MED ORDER — RISPERIDONE 2 MG PO TABS
2.0000 mg | ORAL_TABLET | Freq: Every day | ORAL | Status: DC
  Administered 2015-06-07: 2 mg via ORAL
  Filled 2015-06-07 (×2): qty 1

## 2015-06-07 NOTE — Progress Notes (Signed)
D: patient alert and oriented x 4. Patient verbally denies pain/SI/HI/AVH. Patient was seen by this writer pacing hallway with fixed smile on face and laughing. When asked what was funny patient stated, "Nothing". Patient was compliant with medications. Patient continues to be soft spoken. Patient room dirty, Patient is placing paper towels into toilet and has belongings scattered though out room.  A: Staff to monitor Q 15 mins for safety. Encouragement and support offered. Scheduled medications administered per orders. R: Patient remains safe on the unit. Patient attended group tonight. Patient visible on hte unit and interacting with peers. Patient taking administered medications.

## 2015-06-07 NOTE — Plan of Care (Signed)
Problem: Consults Goal: Psychosis Patient Education See Patient Education Module for education specifics.  Outcome: Progressing Nurse discussed anxiety/coping skills with patient.

## 2015-06-07 NOTE — Progress Notes (Signed)
D:  Patient denied SI and HI this morning, contracts for safety.  Denied A/V hallucinations.  Denied pain. A:  Medications administered.  Patient did refuse miralax this morning, stated he did not need this medication. R:  Safety maintained with 15 minute checks.  Emotional support and encouragement given patient.

## 2015-06-07 NOTE — Progress Notes (Signed)
Patient ID: [name], male   DOB: 18-Jan-1996, 19 y.o.   MRN: [id number] [facility] MD Progress Note  06/07/2015 2:26 PM [name]  MRN:  [id number] Subjective: Patient states "I think I am doing good."     Objective: Patient seen and chart reviewed.[name] is a 19 y.o., African-American, single male who presented to [facility] after being petitioned by his mother for involuntary commitment. Pt per initial notes in EHR stated that "I don't know why I am here some one just called the cops to come and get me". Pt denies SI, HI and AVH; however IVC papers stated that pt has been threatening to kill his family and yelling to the voices. "  Patient discussed with nursing staff and treatment team .  Patient today seen more on the unit. However seen often as staring , staying in the hallways , withdrawn , isolative . Pt does appear paranoid , suspicious . Pt did report to ED MD that he was having AH/Paranoia. However pt has not been forthcoming with his symptoms on the unit here or to Clinical research associate . Pt encouraged to take medications . Pt also with severe marijuana abuse - provided counseling. Per staff pt continues to need lot of encouragement and support.       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 Cannabis use disorder, severe, dependence [F12.20] 10/21/2014                     Total Time spent with patient: 25 minutes   Past Medical History  Diagnosis Date  . ADHD (attention deficit hyperactivity disorder)    ADL's:  Intact  Sleep: Fair  Appetite:  Fair :  Psychiatric Specialty Exam: Physical Exam  Constitutional: He is oriented to  person, place, and time.  HENT:  Head: Normocephalic.  Neck: Normal range of motion.  Respiratory: Effort normal.  Musculoskeletal: Normal range of motion.  Neurological: He is alert and oriented to person, place, and time.  Skin: Skin is warm and dry.  Psychiatric: His speech is normal. His mood appears anxious. He is not slowed and not withdrawn. Thought content is not paranoid. Cognition and memory are normal. He expresses impulsivity. He does not exhibit a depressed mood. He expresses no homicidal and no suicidal ideation.    Review of Systems  Psychiatric/Behavioral: Negative for depression and hallucinations (Denies). Substance abuse: THC. The patient is nervous/anxious. The patient does not have insomnia.   All other systems reviewed and are negative.   Blood pressure 106/63, pulse 64, temperature 98.2 F (36.8 C), temperature source Oral, resp. rate 16, height 6' (1.829 m), weight 83.008 kg (183 lb).Body mass index is 24.81 kg/(m^2).  General Appearance: Bizarre and Guarded has braids falling over his eyes and face  Eye Contact::  Poor  Speech:  Normal Rate  Volume:  Decreased  Mood:  Anxious  Affect:  Flat  Thought Process:  Linear  Orientation:  Full (Time, Place, and Person)  Thought Content:  Paranoid Ideation  Suicidal Thoughts:  No  Homicidal Thoughts:  No  Memory:  Immediate;   Fair Recent;   Fair Remote;   Fair  Judgement:  Poor  Insight:  Lacking  Psychomotor Activity:  Normal  Concentration:  Fair  Recall:  [employer] of Knowledge:Fair  Language: Fair  Akathisia:  No  Handed:  Right  AIMS (if indicated):     Assets:  Physical Health  Sleep:  Number of Hours: 3.75   Musculoskeletal: Strength & Muscle Tone: within normal limits Gait & Station: normal Patient leans: N/A  Current Medications: Current Facility-Administered Medications  Medication Dose Route Frequency Provider Last Rate Last Dose  . acetaminophen (TYLENOL) tablet 650 mg  650 mg Oral Q6H  PRN [name], NP      . alum & mag hydroxide-simeth (MAALOX/MYLANTA) 200-200-20 MG/5ML suspension 30 mL  30 mL Oral Q4H PRN [name], NP      . benztropine (COGENTIN) tablet 0.5 mg  0.5 mg Oral QHS Alencia Gordon, MD   0.5 mg at 06/06/15 2115  . hydrOXYzine (ATARAX/VISTARIL) tablet 25 mg  25 mg Oral TID PRN [name], NP      . ibuprofen (ADVIL,MOTRIN) tablet 600 mg  600 mg Oral Q8H PRN [name], NP      . lamoTRIgine (LAMICTAL) tablet 25 mg  25 mg Oral Daily [name], MD   25 mg at 06/07/15 0748  . magnesium hydroxide (MILK OF MAGNESIA) suspension 30 mL  30 mL Oral Daily PRN [name], NP      . ondansetron [facility]) tablet 4 mg  4 mg Oral Q8H PRN [name], NP      . polyethylene glycol (MIRALAX / GLYCOLAX) packet 17 g  17 g Oral BID [name], MD   17 g at 06/06/15 1736  . risperiDONE (RISPERDAL) tablet 1 mg  1 mg Oral QHS [name], MD   1 mg at 06/06/15 2115  . traZODone (DESYREL) tablet 50 mg  50 mg Oral QHS [name], NP   50 mg at 06/06/15 2115    Lab Results: No results found for this or any previous visit (from the past 48 hour(s)).  Physical Findings: AIMS: Facial and Oral Movements Muscles of Facial Expression: None, normal Lips and Perioral Area: None, normal Jaw: None, normal Tongue: None, normal,Extremity Movements Upper (arms, wrists, hands, fingers): None, normal Lower (legs, knees, ankles, toes): None, normal, Trunk Movements Neck, shoulders, hips: None, normal, Overall Severity Severity of abnormal movements (highest score from questions above): None, normal Incapacitation due to abnormal movements: None, normal Patient's awareness of abnormal movements (rate only patient's report): No Awareness, Dental Status Current problems with teeth and/or dentures?: No Does patient usually wear dentures?: No  CIWA:  CIWA-Ar Total: 1 COWS:  COWS Total Score: 1  Treatment Plan Summary: Daily contact with patient to assess and  evaluate symptoms and progress in treatment Medication management  Assessment: Patient is a 19 year old AAM ,who presented after having AH/ paranoia as well as agitation at home. Patient's UDS positive for THC. Pt continues to appear bizarre, paranoid . Will continue medication changes.  Plan:  Will increase Risperidone to 2 mg po qhs for psychosis. Continue  Cogentin 0.5 mg po qhs for EPS. Continue Lamictal 25 mg po daily for mood lability. Pt likely with cannabis induced psyhcosis/mood sx- however will need to be sober for some time inorder to be diagnosed with a primary psychopathology - he does have a hx of bipolar do in his family. Will continue to monitor vitals ,medication compliance and treatment side effects while patient is here.  Will monitor for medical issues as well as call consult as needed.  Reviewed labs-pt refused all ordered labs today. EKG -qtc wnl. CSW will start working on disposition.  Patient to participate in therapeutic milieu .         Medical Decision Making Problem Points:  Established problem, worsening (2) and Review of last therapy session (1) Data Points:  Review or order clinical lab tests (1) Review of medication regiment & side effects (2)  I certify that inpatient services furnished can reasonably be expected to improve the patient's condition.   Rettie Laird, md 06/07/2015, 2:26 PM

## 2015-06-07 NOTE — [facility] Group Notes (Signed)
[facility] [employer] Group Therapy  06/07/2015 , 1:22 PM    Type of Therapy:  [employer] Presentation  Participation Level:  Active  Participation Quality:  Attentive  Affect:  Blunted  Cognitive:  Oriented  Insight:  Limited  Engagement in Therapy:  Engaged  Modes of Intervention:  Discussion, Education and Socialization  Summary of Progress/Problems:  Onalee Hua from [employer] came to present his recovery story and play the guitar.  Invited.  Chose to not attend  [place] B 06/07/2015 , 1:22 PM

## 2015-06-07 NOTE — Tx Team (Signed)
Interdisciplinary Treatment Plan Update (Adult)  Date:  06/07/2015   Time Reviewed:  10:50 AM   Progress in Treatment: Attending groups: No Participating in groups:  No Taking medication as prescribed:  Yes. Tolerating medication:  Yes. Family/Significant othe contact made:  Yes Patient understands diagnosis:  No  Limited insight Discussing patient identified problems/goals with staff:  Yes, see initial care plan. Medical problems stabilized or resolved:  Yes. Denies suicidal/homicidal ideation: Yes. Issues/concerns per patient self-inventory:  No. Other:  New problem(s) identified:  Discharge Plan or Barriers: return home, follow up outpt  Reason for Continuation of Hospitalization: Delusions  Hallucinations Medication stabilization Other; describe paranoia  Comments:  [name] is an 19 y.o. male pt is presenting to [facility] after being petitioned by his mother for involuntary commitment. Pt stated "I don't know why I am here some just called the cops to come and get me". Pt denies SI, HI and AVH; however IVC papers stated that pt has been threatening to kill his family and yelling to the voices. Collateral information was gathered from pt's mother who reported that for the past month pt has been acting strange. She reported that pt has been trying to deal with the voices alone and has been yelling and hollering at them. She also reported that pt has not been eating, sleeping or drinking. She shared that she has to tell pt to take a shower and he will run the water for approximately 3 hours. She reported that pt is unable to live with her due to his behaviors and shared that whenever she picks him up to shower she has to call the police to get him to leave. She also reported that pt is unable to stay with his siblings because he will stand over them while they are asleep.  Lamictal, Risperdal, Trazodone  Estimated length of stay: 3-5 days  New goal(s):  Review of initial/current  patient goals per problem list:   Review of initial/current patient goals per problem list:  1. Goal(s): Patient will participate in aftercare plan   Met: Yes   Target date: 3-5 days post admission date   As evidenced by: Patient will participate within aftercare plan AEB aftercare provider and housing plan at discharge being identified.  06/07/2015:Pt plans to return home, follow up outpt.        5. Goal(s): Patient will demonstrate decreased signs of psychosis  * Met: No  * Target date: 3-5 days post admission date  * As evidenced by: Patient will demonstrate decreased frequency of AVH or return to baseline function 06/07/15  Pt has been non-compliant with meds, smoking cannabis daily, displaying symptoms of paranoia and disorganization     Attendees: Patient:  06/07/2015 10:50 AM   Family:   06/07/2015 10:50 AM   Physician:  Ursula Alert, MD 06/07/2015 10:50 AM   Nursing:   Manuella Ghazi, RN 06/07/2015 10:50 AM   CSW:    Roque Lias, LCSW   06/07/2015 10:50 AM   Other:  06/07/2015 10:50 AM   Other:   06/07/2015 10:50 AM   Other:  Lars Pinks, Nurse CM 06/07/2015 10:50 AM   Other:  Lucinda Dell, [facility] 06/07/2015 10:50 AM   Other:  Norberto Sorenson, [place]  06/07/2015 10:50 AM   Other:  06/07/2015 10:50 AM   Other:  06/07/2015 10:50 AM   Other:  06/07/2015 10:50 AM   Other:  06/07/2015 10:50 AM   Other:  06/07/2015 10:50 AM   Other:  06/07/2015 10:50 AM    Scribe for Treatment Team:   Trish Mage, 06/07/2015 10:50 AM

## 2015-06-08 MED ORDER — RISPERIDONE 2 MG PO TABS
2.5000 mg | ORAL_TABLET | Freq: Every day | ORAL | Status: DC
  Administered 2015-06-08 – 2015-06-11 (×3): 2.5 mg via ORAL
  Filled 2015-06-08 (×5): qty 1

## 2015-06-08 NOTE — Progress Notes (Signed)
D   Pt is bizarre and appears to be responding to internal stimuli   He at first did not want to take his medication but did finally take it with encouragement   He complained of his arm hurting and wanted it wrapped   It was examined and nothing was noted but kerlex was wrapped around it to give him some support   He said it felt better  Patient roaming the hallways seeming to have trouble focusing A   Verbal  support given   Medications administered and effectiveness monitored   Q 15 min checks   Redirect as needed R   Pt is safe and responds well to redirection

## 2015-06-08 NOTE — [facility] Group Notes (Signed)
[facility] Group Notes:  (Nursing/MHT/Case Management/Adjunct)  Date:  06/08/2015  Time: 0930 Type of Therapy:  Nurse Education  Participation Level:  Minimal  Participation Quality:  Attentive, Sharing and Supportive  Affect:  Flat  Cognitive:  Alert and and limited  Insight:  Limited  Engagement in Group:  Engaged and Limited  Modes of Intervention:  Discussion, Education, Orientation and Support  Summary of Progress/Problems: Pt attended and participated in scheduled group session. Pt was engaged throughout this session.   Ouida Sills, Lincoln Maxin 06/08/2015, 0930

## 2015-06-08 NOTE — Progress Notes (Signed)
Patient did not attend group but was offered to join.

## 2015-06-08 NOTE — [facility] Group Notes (Signed)
[facility] Group Notes:  (Counselor/Nursing/MHT/Case Management/Adjunct)  06/08/2015 1:15PM  Type of Therapy:  Group Therapy  Participation Level: Invited.  Chose to not attend.  Was looking in the door several times throughout group, but did not come in.  Summary of Progress/Problems: The topic for group was balance in life.  Pt participated in the discussion about when their life was in balance and out of balance and how this feels.  Pt discussed ways to get back in balance and short term goals they can work on to get where they want to be.    Ida Rogue 06/08/2015 3:19 PM

## 2015-06-08 NOTE — Progress Notes (Addendum)
D: patient alert and oriented x 4. Patient verbally denies pain/SI/HI/AVH. Patient continues to pacing hallway. Patient was compliant with medications. Patient continues to be soft spoken. Patient is randomly smiling while pacing the hallway, every so often you will hear patient talking to self. Patient was redirected when pacing the floor looking into other patients rooms.  A: Staff to monitor Q 15 mins for safety. Encouragement and support offered. Scheduled medications administered per orders. R: Patient remains safe on the unit. Patient attended group tonight. Patient visible on hte unit and interacting with peers. Patient taking administered medications.

## 2015-06-08 NOTE — Progress Notes (Signed)
Patient ID: [name], male   DOB: Aug 21, 1996, 21 y.o.   MRN: [id number] [facility] MD Progress Note  06/08/2015 1:46 PM [name]  MRN:  [id number] Subjective: Patient states "When can I go home.'      Objective: Patient seen and chart reviewed.[name] is a 19 y.o., African-American, single male who presented to [facility] after being petitioned by his mother for involuntary commitment. Pt per initial notes in EHR stated that "I don't know why I am here some one just called the cops to come and get me". Pt denies SI, HI and AVH; however IVC papers stated that pt has been threatening to kill his family and yelling to the voices. "  Patient discussed with nursing staff and treatment team .  Patient today continues to have periods when he is seen as restless , pacing the halls. Other than that he denies any AH/Paranoia. Has limited insight in to his illness or his substance use disorder. Pt encouraged to take medications . Pt also with severe marijuana abuse - his current as well as previous admissions were likely 2/2 cannabis abuse. Per staff pt continues to need lot of encouragement and support.       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 Cannabis use disorder, severe, dependence [F12.20] 10/21/2014                     Total Time spent with patient: 25 minutes   Past Medical History  Diagnosis Date  . ADHD (attention deficit hyperactivity disorder)    ADL's:  Intact  Sleep: Fair  Appetite:  Fair :  Psychiatric Specialty Exam: Physical Exam  Constitutional: He is oriented to person, place, and time.  HENT:  Head:  Normocephalic.  Neck: Normal range of motion.  Respiratory: Effort normal.  Musculoskeletal: Normal range of motion.  Neurological: He is alert and oriented to person, place, and time.  Skin: Skin is warm and dry.  Psychiatric: His speech is normal. His mood appears anxious. He is not slowed and not withdrawn. Thought content is not paranoid. Cognition and memory are normal. He expresses impulsivity. He does not exhibit a depressed mood. He expresses no homicidal and no suicidal ideation.    Review of Systems  Psychiatric/Behavioral: Negative for depression and hallucinations (Denies). Substance abuse: THC. The patient is nervous/anxious. The patient does not have insomnia.   All other systems reviewed and are negative.   Blood pressure 122/73, pulse 73, temperature 98 F (36.7 C), temperature source Oral, resp. rate 20, height 6' (1.829 m), weight 83.008 kg (183 lb).Body mass index is 24.81 kg/(m^2).  General Appearance: Bizarre and Guarded has braids falling over his eyes and face  Eye Contact::  Poor  Speech:  Normal Rate  Volume:  Decreased  Mood:  Anxious  Affect:  Flat  Thought Process:  Linear  Orientation:  Full (Time, Place, and Person)  Thought Content:  Paranoid Ideation  Suicidal Thoughts:  No  Homicidal Thoughts:  No  Memory:  Immediate;   Fair Recent;   Fair Remote;   Fair  Judgement:  Poor  Insight:  Lacking  Psychomotor Activity:  Normal  Concentration:  Fair  Recall:  [employer] of Knowledge:Fair  Language: Fair  Akathisia:  No  Handed:  Right  AIMS (if indicated):     Assets:  Physical Health  Sleep:  Number of Hours: 2.25   Musculoskeletal: Strength & Muscle Tone: within normal limits Gait & Station: normal Patient leans: N/A  Current Medications: Current Facility-Administered Medications  Medication Dose Route Frequency Provider Last Rate Last Dose  . acetaminophen (TYLENOL) tablet 650 mg  650 mg Oral Q6H PRN [name], NP      . alum & mag  hydroxide-simeth (MAALOX/MYLANTA) 200-200-20 MG/5ML suspension 30 mL  30 mL Oral Q4H PRN [name], NP      . benztropine (COGENTIN) tablet 0.5 mg  0.5 mg Oral QHS Shakayla Hickox, MD   0.5 mg at 06/07/15 2139  . hydrOXYzine (ATARAX/VISTARIL) tablet 25 mg  25 mg Oral TID PRN [name], NP      . ibuprofen (ADVIL,MOTRIN) tablet 600 mg  600 mg Oral Q8H PRN [name], NP      . lamoTRIgine (LAMICTAL) tablet 25 mg  25 mg Oral Daily [name], MD   25 mg at 06/08/15 0809  . magnesium hydroxide (MILK OF MAGNESIA) suspension 30 mL  30 mL Oral Daily PRN [name], NP      . ondansetron [facility]) tablet 4 mg  4 mg Oral Q8H PRN [name], NP      . polyethylene glycol (MIRALAX / GLYCOLAX) packet 17 g  17 g Oral BID [name], MD   17 g at 06/06/15 1736  . risperiDONE (RISPERDAL) tablet 2.5 mg  2.5 mg Oral QHS Eligio Angert, MD      . traZODone (DESYREL) tablet 50 mg  50 mg Oral QHS [name], NP   50 mg at 06/07/15 2139    Lab Results: No results found for this or any previous visit (from the past 48 hour(s)).  Physical Findings: AIMS: Facial and Oral Movements Muscles of Facial Expression: None, normal Lips and Perioral Area: None, normal Jaw: None, normal Tongue: None, normal,Extremity Movements Upper (arms, wrists, hands, fingers): None, normal Lower (legs, knees, ankles, toes): None, normal, Trunk Movements Neck, shoulders, hips: None, normal, Overall Severity Severity of abnormal movements (highest score from questions above): None, normal Incapacitation due to abnormal movements: None, normal Patient's awareness of abnormal movements (rate only patient's report): No Awareness, Dental Status Current problems with teeth and/or dentures?: No Does patient usually wear dentures?: No  CIWA:  CIWA-Ar Total: 1 COWS:  COWS Total Score: 1  Treatment Plan Summary: Daily contact with patient to assess and evaluate symptoms and progress in treatment Medication  management  Assessment: Patient is a 19 year old AAM ,who presented after having AH/ paranoia as well as agitation at home. Patient's UDS positive for THC. Pt continues to appear bizarre, paranoid , but denies any sx , continues to lack insight . Will continue medication changes.  Plan:  Will increase Risperidone to 2.5 mg po qhs for psychosis. Continue  Cogentin 0.5 mg po qhs for EPS. Continue Lamictal 25 mg po daily for mood lability. Pt likely with cannabis induced psyhcosis/mood sx- however will need to be sober for some time inorder to be diagnosed with a primary psychopathology - he does have a hx of bipolar do in his family. Will continue to monitor vitals ,medication compliance and treatment side effects while patient is here.  Will monitor for medical issues as well as call consult as needed.  Reviewed labs-pt refused all  ordered labs today. EKG -qtc wnl. CSW will start working on disposition.  Patient to participate in therapeutic milieu .         Medical Decision Making Problem Points:  Established problem, worsening (2) and Review of last therapy session (1) Data Points:  Review or order clinical lab tests (1) Review of medication regiment & side effects (2)  I certify that inpatient services furnished can reasonably be expected to improve the patient's condition.   Fatime Biswell, md 06/08/2015, 1:46 PM

## 2015-06-08 NOTE — Plan of Care (Signed)
Problem: Ineffective individual coping Goal: STG: Patient will remain free from self harm Outcome: Progressing Pt observed talking to self and pacing in hall at intervals without gestures or incident of self injurious behavior to report thus far this shift. Q 15 minutes checks maintained for safety as ordered.

## 2015-06-08 NOTE — Progress Notes (Signed)
D: Pt presents with flat affect and anxious mood. Observed pacing hall at intervals and sometimes talking to self. Denies SI, HI, AVH and pain when assessed earlier this shift. Pt rated his depression 3/10, hopelessness 2/10 and anxiety 3/10 on self inventory sheet. Pt attended scheduled nursing group this AM when prompted but was visible in hall pacing while noon group was going on. Pt was observed by room mate putting lots of tissue paper in the commode including the wrapper on the tissue and the room came to alert staff of incident as he was irritated about it and stated that this pt will try to blame it on him (room mate) A: Availability and support offered to pt. All medications administered as prescribed with verbal education. Encouragement provided to pt related to care and treatment compliance. Q 15 minutes checks effective as ordered for pt's safety without outburst or self harm gestures to report at this time.  R: Pt remains safe on and off unit. Pt denies adverse drug reactions at time of assessment. Plan of care continues.

## 2015-06-09 NOTE — [facility] Group Notes (Signed)
[facility] LCSW Group Therapy  06/09/2015  1:05 PM  Type of Therapy:  Group therapy  Participation Level:  Active  Participation Quality:  Attentive  Affect:  Flat  Cognitive:  Oriented  Insight:  Limited  Engagement in Therapy:  Limited  Modes of Intervention:  Discussion, Socialization  Summary of Progress/Problems:  Chaplain was here to lead a group on themes of hope and courage. Was reluctant to talk, even when asked directly.  When he was on the hot seat, another pt pointed out that he was smiling and laughing earlier, and how much she appreciated that.  It was hard to tell whether he appreciated that or not.  "Hope is overcoming."  Declined to elaborate further. [name] 06/09/2015 1:20 PM

## 2015-06-09 NOTE — Progress Notes (Signed)
D   Pt is bizarre and appears to be responding to internal stimuli   He at first did not want to take his medication but did finally take it with encouragement   Pt is very paranoid and suspicious and appears to respond to internal stimuli and his affect is incongruent   Patient roaming the hallways seeming to have trouble focusing A   Verbal  support given   Medications administered and effectiveness monitored   Q 15 min checks   Redirect as needed  Reassure of safety  R   Pt is safe and responds well to redirection

## 2015-06-09 NOTE — Progress Notes (Signed)
Patient ID: [name], male   DOB: [birth year], 19 y.o.   MRN: [id number]   Pt currently presents with a flat affect and guarded, restless behavior. Per self inventory, pt rates depression at a 4, hopelessness 2 and anxiety 4. Pt's daily goal is "getting out of here" and they intend to do so by "go to groups and do what I have to to leave." Pt reports good sleep, a good appetite, normal energy and good concentration. Pt paces the hallways today and seen eating his snack in the corner of the hallway by himself. Pt places his long hair in front of his face while he talks to staff and walks the hallways. Interaction with pt pleasant but pt forwards little.  Pt provided with medications per providers orders. Pt's labs and vitals were monitored throughout the day. Pt supported emotionally and encouraged to express concerns and questions. Pt educated on medications and assertiveness techniques, this education needs reinforcement.  Pt's safety ensured with 15 minute and environmental checks. Pt currently denies SI/HI and A/V hallucinations. Pt verbally agrees to seek staff if SI/HI or A/VH occurs and to consult with staff before acting on these thoughts. Will continue POC.

## 2015-06-09 NOTE — Progress Notes (Signed)
Patient ID: [name], male   DOB: 03-May-1996, 19 y.o.   MRN: [id number] [facility] MD Progress Note  06/09/2015 12:18 PM UNDRA [name]  MRN:  [id number] Subjective: Patient states "I am fine.'       Objective: Patient seen and chart reviewed.[name] is a 19 y.o., African-American, single male who presented to [facility] after being petitioned by his mother for involuntary commitment. Pt per initial notes in EHR stated that "I don't know why I am here some one just called the cops to come and get me". Pt denies SI, HI and AVH; however IVC papers stated that pt has been threatening to kill his family and yelling to the voices. "  Patient discussed with nursing staff and treatment team .  Patient today denies any new concerns .  Continues to have periods when he is  restless , but has been improving since admission. Pt today with some motivation to get help with his cannabis abuse - reports he wants to talk about it.Pt to be referred to [facility] /other substance abuse program , since his current sx are more likely substance induced. Pt encouraged to take medications . Per staff pt continues to need lot of encouragement and support.       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 Cannabis use disorder, severe, dependence [F12.20] 10/21/2014                     Total Time spent with patient: 25 minutes   Past Medical History  Diagnosis Date  . ADHD (attention deficit hyperactivity disorder)    ADL's:  Intact  Sleep: Fair  Appetite:  Fair :  Psychiatric Specialty Exam: Physical Exam  Constitutional: He is oriented to person, place, and time.   HENT:  Head: Normocephalic.  Neck: Normal range of motion.  Respiratory: Effort normal.  Musculoskeletal: Normal range of motion.  Neurological: He is alert and oriented to person, place, and time.  Skin: Skin is warm and dry.  Psychiatric: His speech is normal. His mood appears anxious. He is not slowed and not withdrawn. Thought content is not paranoid. Cognition and memory are normal. He expresses impulsivity. He does not exhibit a depressed mood. He expresses no homicidal and no suicidal ideation.    Review of Systems  Psychiatric/Behavioral: Negative for depression and hallucinations (Denies). Substance abuse: THC. The patient is nervous/anxious. The patient does not have insomnia.   All other systems reviewed and are negative.   Blood pressure 136/78, pulse 73, temperature 98.4 F (36.9 C), temperature source Oral, resp. rate 16, height 6' (1.829 m), weight 83.008 kg (183 lb).Body mass index is 24.81 kg/(m^2).  General Appearance: Bizarre and Guarded has braids falling over his eyes and face  Eye Contact::  Poor  Speech:  Normal Rate  Volume:  Decreased  Mood:  Anxious  Affect:  Flat  Thought Process:  Linear  Orientation:  Full (Time, Place, and Person)  Thought Content:  Paranoid Ideation improving  Suicidal Thoughts:  No  Homicidal Thoughts:  No  Memory:  Immediate;   Fair Recent;   Fair Remote;   Fair  Judgement:  Poor  Insight:  Lacking  Psychomotor Activity:  Normal  Concentration:  Fair  Recall:  [employer] of Knowledge:Fair  Language: Fair  Akathisia:  No  Handed:  Right  AIMS (if indicated):     Assets:  Physical Health  Sleep:  Number of Hours: 6.75   Musculoskeletal: Strength & Muscle Tone: within normal limits Gait & Station: normal Patient leans: N/A  Current Medications: Current Facility-Administered Medications  Medication Dose Route Frequency Provider Last Rate Last Dose  . acetaminophen (TYLENOL) tablet 650 mg  650 mg Oral Q6H PRN [name], NP      . alum & mag hydroxide-simeth (MAALOX/MYLANTA) 200-200-20 MG/5ML suspension 30 mL  30 mL Oral Q4H PRN [name], NP      . benztropine (COGENTIN) tablet 0.5 mg  0.5 mg Oral QHS [name], MD   0.5 mg at 06/08/15 2205  . hydrOXYzine (ATARAX/VISTARIL) tablet 25 mg  25 mg Oral TID PRN [name], NP      . ibuprofen (ADVIL,MOTRIN) tablet 600 mg  600 mg Oral Q8H PRN [name], NP      . lamoTRIgine (LAMICTAL) tablet 25 mg  25 mg Oral Daily [name], MD   25 mg at 06/09/15 1478  . magnesium hydroxide (MILK OF MAGNESIA) suspension 30 mL  30 mL Oral Daily PRN [name], NP      . ondansetron [facility]) tablet 4 mg  4 mg Oral Q8H PRN [name], NP      . polyethylene glycol (MIRALAX / GLYCOLAX) packet 17 g  17 g Oral BID [name], MD   17 g at 06/06/15 1736  . risperiDONE (RISPERDAL) tablet 2.5 mg  2.5 mg Oral QHS [name], MD   2.5 mg at 06/08/15 2205  . traZODone (DESYREL) tablet 50 mg  50 mg Oral QHS [name], NP   50 mg at 06/08/15 2209    Lab Results: No results found for this or any previous visit (from the past 48 hour(s)).  Physical Findings: AIMS: Facial and Oral Movements Muscles of Facial Expression: None, normal Lips and Perioral Area: None, normal Jaw: None, normal Tongue: None, normal,Extremity Movements Upper (arms, wrists, hands, fingers): None, normal Lower (legs, knees, ankles, toes): None, normal, Trunk Movements Neck, shoulders, hips: None, normal, Overall Severity Severity of abnormal movements (highest score from questions above): None, normal Incapacitation due to abnormal movements: None, normal Patient's awareness of abnormal movements (rate only patient's report): No Awareness, Dental Status Current problems with teeth and/or dentures?: No Does patient usually wear dentures?: No  CIWA:  CIWA-Ar Total: 1 COWS:  COWS Total Score: 1  Treatment Plan Summary: Daily contact with patient to assess and  evaluate symptoms and progress in treatment Medication management  Assessment: Patient is a 19 year old AAM ,who presented after having AH/ paranoia as well as agitation at home. Patient's UDS positive for THC. Pt continues to appear bizarre, paranoid . Pt with some insight in to his cannabis abuse . Pt encouraged to get substance abuse counseling and support once discharged .Will continue medication changes.  Plan:  Will continue Risperidone 2.5 mg po qhs for psychosis. Continue  Cogentin 0.5 mg po qhs for EPS. Continue Lamictal 25 mg po daily for mood lability. Pt likely with cannabis induced psyhcosis/mood sx- however will need to be sober for some time inorder to be diagnosed with a primary psychopathology - he does have a hx of bipolar do in his family. Will continue to monitor vitals ,medication compliance and treatment side  effects while patient is here.  Will monitor for medical issues as well as call consult as needed.  Reviewed labs-pt refused all ordered labs today. EKG -qtc wnl. CSW will start working on disposition. Pt to be referred to a substance abuse program once discharged. Patient to participate in therapeutic milieu .         Medical Decision Making Problem Points:  Established problem, worsening (2) and Review of last therapy session (1) Data Points:  Review or order clinical lab tests (1) Review of medication regiment & side effects (2)  I certify that inpatient services furnished can reasonably be expected to improve the patient's condition.   Hazley Dezeeuw, md 06/09/2015, 12:18 PM

## 2015-06-09 NOTE — Progress Notes (Signed)
Adult Psychoeducational Group Note  Date:  06/09/2015 Time:  10:36 AM  Group Topic/Focus:  Emotional Education:   The focus of this group is to discuss what feelings/emotions are, and how they are experienced. Relapse Prevention Planning:   The focus of this group is to define relapse and discuss the need for planning to combat relapse.  Participation Level:  Active  Participation Quality:  Attentive  Affect:  Appropriate  Cognitive:  Appropriate  Insight: Good  Engagement in Group:  Engaged  Modes of Intervention:  Discussion  Additional Comments:  Pt was was quiet for most of the group today.  Pt did respond when staff asked him a question and he talked about the people he considered to be his support system.  Pt expressed he was feeling "better" and he talked about developing some coping skills/  Maude Leriche 06/09/2015, 10:36 AM

## 2015-06-09 NOTE — Plan of Care (Signed)
Problem: Alteration in thought process Goal: STG-Patient is able to follow short directions Outcome: Completed/Met Date Met:  06/09/15 Pt able to follow directions in the cafeteria, take medications as described

## 2015-06-09 NOTE — Progress Notes (Signed)
Psychoeducational Group Note  Date:  06/09/2015 Time:  2056  Group Topic/Focus:  Wrap-Up Group:   The focus of this group is to help patients review their daily goal of treatment and discuss progress on daily workbooks.  Participation Level: Did Not Attend  Participation Quality:  Not Applicable  Affect:  Not Applicable  Cognitive:  Not Applicable  Insight:  Not Applicable  Engagement in Group: Not Applicable  Additional Comments:  The patient did not attend group this evening.   Hazle Coca S 06/09/2015, 8:57 PM

## 2015-06-10 DIAGNOSIS — F12120 Cannabis abuse with intoxication, uncomplicated: Secondary | ICD-10-CM

## 2015-06-10 NOTE — Progress Notes (Signed)
Adult Psychoeducational Group Note  Date:  06/10/2015 Time: 09:15am  Group Topic/Focus:  Healthy Communication:   The focus of this group is to discuss communication, barriers to communication, as well as healthy ways to communicate with others.  Participation Level:  Did Not Attend  Participation Quality: n/a  Affect: n/a  Cognitive: n/a  Insight: n/a  Engagement in Group: n/a  Modes of Intervention:  Activity, Discussion, Education, Orientation and Socialization  Additional Comments:  Pt did not attend group. Pt seen pacing in hallway.   Aurora Mask 06/10/2015, 10:02 AM

## 2015-06-10 NOTE — Progress Notes (Signed)
[facility] Group Notes:  (Nursing/MHT/Case Management/Adjunct)  Date:  06/10/2015  Time:  9:31 PM  Type of Therapy:  Psychoeducational Skills  Participation Level:  Minimal  Participation Quality:  Attentive  Affect:  Flat  Cognitive:  Lacking  Insight:  Lacking  Engagement in Group:  Resistant  Modes of Intervention:  Education  Summary of Progress/Problems: The patient indicated that he had a good day since he was able to go to the gym. As a theme for the day, his support system will be his family and friends.   Hazle Coca S 06/10/2015, 9:31 PM

## 2015-06-10 NOTE — Plan of Care (Addendum)
Problem: Alteration in thought process Goal: LTG-Patient is able to perceive the environment accurately Outcome: Not Met (add Reason) Pt taking frequent showers, walking in hallway with wet feet.  Education given regarding safety and need to dry body and feet prior to walking in hallway.  Pt unsure of why he was admitted to hospital, "I ran away from the cops, they were chasing me." Goal: LTG-Patient verbalizes understanding importance med regimen (Patient verbalizes understanding of importance of medication regimen and need to continue outpatient care.)  Outcome: Progressing Taking medicines as prescribed. Goal: STG-Patient is able to discuss thoughts with staff Outcome: Progressing Able to discuss on superficial level, limited insight. Goal: STG-Patient does not respond to command hallucinations Outcome: Progressing Noted pt. laughing in hallway to self. Denies A/V hallucinations.

## 2015-06-10 NOTE — Progress Notes (Signed)
Patient ID: [name], male   DOB: 1996-01-16, 19 y.o.   MRN: [id number] [facility] MD Progress Note  06/10/2015 2:59 PM [name]  MRN:  [id number] Subjective: Patient states "I had an IVC put on me. My family was concerned about some text messages. I drink about every other day. I do not see that as a problem. I like the way drinking makes me feel."   Objective: Patient seen and chart reviewed.[name] is a 19 y.o., African-American, single male who presented to [facility] after being petitioned by his mother for involuntary commitment. Pt per initial notes in EHR stated that "I don't know why I am here some one just called the cops to come and get me". Pt denies SI, HI and AVH; however IVC papers stated that pt has been threatening to kill his family and yelling to the voices. "    Patient today continues to have periods when he is seen as restless , pacing the halls. Other than that he denies any AH/Paranoia. Has limited insight in to his illness or his substance use disorder. Pt encouraged to take medications. Nursing staff report the patient has been observed responding to internal stimuli. Patient has been resistant to taking his psychotropic medications but took them last night after a great deal of encouragement from staff. Pt also with severe marijuana abuse - his current as well as previous admissions were likely 2/2 cannabis abuse.  His urine has been positive for marijuana on each admission. Per staff pt continues to need lot of encouragement and support. During assessment the patient was asked to move the braids from his face in order to make better eye contact.      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 Cannabis use disorder, severe, dependence [F12.20] 10/21/2014           Total Time spent with patient: 20 minutes   Past Medical History  Diagnosis Date  . ADHD (attention deficit hyperactivity disorder)    ADL's:  Intact  Sleep: Fair  Appetite:  Fair :  Psychiatric Specialty Exam: Physical Exam  Constitutional: He is oriented to person, place, and time.  HENT:  Head: Normocephalic.  Neck: Normal range of motion.  Respiratory: Effort normal.  Musculoskeletal: Normal range of motion.  Neurological: He is alert and oriented to person, place, and time.  Skin: Skin is warm and dry.  Psychiatric: His speech is normal. His mood appears anxious. He is not slowed and not withdrawn. Thought content is not paranoid. Cognition and memory are normal. He expresses impulsivity. He does not exhibit a depressed mood. He expresses no homicidal and no suicidal ideation.    Review of Systems  Constitutional: Negative.   HENT: Negative.   Eyes: Negative.   Respiratory: Negative.   Cardiovascular: Negative.   Gastrointestinal: Negative.   Genitourinary: Negative.   Musculoskeletal: Negative.   Skin: Negative.   Neurological: Negative.   Endo/Heme/Allergies: Negative.   Psychiatric/Behavioral: Positive for hallucinations (Denies but has been observed responding on the unit ) and substance abuse (THC). Negative for depression. The patient is nervous/anxious. The patient does not have insomnia.   All other systems reviewed and are negative.   Blood pressure 136/55, pulse 60, temperature  97.5 F (36.4 C), temperature source Oral, resp. rate 16, height 6' (1.829 m), weight 83.008 kg (183 lb).Body mass index is 24.81 kg/(m^2).  General Appearance: Bizarre and Guarded has braids falling over his eyes and face  Eye Contact::  Poor  Speech:  Normal Rate  Volume:  Decreased  Mood:  Anxious  Affect:  Flat  Thought Process:  Linear   Orientation:  Full (Time, Place, and Person)  Thought Content:  Paranoid Ideation  Suicidal Thoughts:  No  Homicidal Thoughts:  No  Memory:  Immediate;   Fair Recent;   Fair Remote;   Fair  Judgement:  Poor  Insight:  Lacking  Psychomotor Activity:  Normal  Concentration:  Fair  Recall:  [employer] of Knowledge:Fair  Language: Fair  Akathisia:  No  Handed:  Right  AIMS (if indicated):     Assets:  Physical Health  Sleep:  Number of Hours: 4   Musculoskeletal: Strength & Muscle Tone: within normal limits Gait & Station: normal Patient leans: N/A  Current Medications: Current Facility-Administered Medications  Medication Dose Route Frequency Provider Last Rate Last Dose  . acetaminophen (TYLENOL) tablet 650 mg  650 mg Oral Q6H PRN [name], NP      . alum & mag hydroxide-simeth (MAALOX/MYLANTA) 200-200-20 MG/5ML suspension 30 mL  30 mL Oral Q4H PRN [name], NP      . benztropine (COGENTIN) tablet 0.5 mg  0.5 mg Oral QHS [name], MD   0.5 mg at 06/09/15 2150  . hydrOXYzine (ATARAX/VISTARIL) tablet 25 mg  25 mg Oral TID PRN [name], NP      . ibuprofen (ADVIL,MOTRIN) tablet 600 mg  600 mg Oral Q8H PRN [name], NP   600 mg at 06/10/15 0954  . lamoTRIgine (LAMICTAL) tablet 25 mg  25 mg Oral Daily [name], MD   25 mg at 06/10/15 0820  . magnesium hydroxide (MILK OF MAGNESIA) suspension 30 mL  30 mL Oral Daily PRN [name], NP      . ondansetron [facility]) tablet 4 mg  4 mg Oral Q8H PRN [name], NP      . polyethylene glycol (MIRALAX / GLYCOLAX) packet 17 g  17 g Oral BID [name], MD   17 g at 06/06/15 1736  . risperiDONE (RISPERDAL) tablet 2.5 mg  2.5 mg Oral QHS [name], MD   2.5 mg at 06/09/15 2150  . traZODone (DESYREL) tablet 50 mg  50 mg Oral QHS [name], NP   50 mg at 06/09/15 2150    Lab Results: No results found for this or any previous visit (from the past 48 hour(s)).  Physical Findings: AIMS:  Facial and Oral Movements Muscles of Facial Expression: None, normal Lips and Perioral Area: None, normal Jaw: None, normal Tongue: None, normal,Extremity Movements Upper (arms, wrists, hands, fingers): None, normal Lower (legs, knees, ankles, toes): None, normal, Trunk Movements Neck, shoulders, hips: None, normal, Overall Severity Severity of abnormal movements (highest score from questions above): None, normal Incapacitation due to abnormal movements: None, normal Patient's awareness of abnormal movements (rate only patient's report): No Awareness, Dental Status Current problems with teeth and/or dentures?: No Does patient usually wear dentures?: No  CIWA:  CIWA-Ar Total: 1 COWS:  COWS Total Score: 1  Treatment Plan Summary: Daily contact with patient to assess and evaluate symptoms and progress in treatment Medication management  Assessment: Patient is a 19 year old AAM ,who presented  after having AH/ paranoia as well as agitation at home. Patient's UDS positive for THC. Pt continues to appear bizarre, paranoid , but denies any sx , continues to lack insight . Will continue medication changes.  Plan:  Will continue Risperidone to 2.5 mg po qhs for psychosis. Continue  Cogentin 0.5 mg po qhs for EPS. Continue Lamictal 25 mg po daily for mood lability. Pt likely with cannabis induced psychosis/mood sx- however will need to be sober for some time inorder to be diagnosed with a primary psychopathology - he does have a hx of bipolar do in his family. Will continue to monitor vitals ,medication compliance and treatment side effects while patient is here.  Will monitor for medical issues as well as call consult as needed.  Reviewed labs-pt refused all ordered labs today. EKG -qtc wnl. CSW will start working on disposition.  Patient to participate in therapeutic milieu .   Medical Decision Making Problem Points:  Established problem, worsening (2) and Review of last therapy session  (1) Data Points:  Review or order clinical lab tests (1) Review of medication regiment & side effects (2)  I certify that inpatient services furnished can reasonably be expected to improve the patient's condition.   Fransisca Kaufmann, NP-C 06/10/2015, 2:59 PM I agree with assessment and plan Madie Reno A. Dub Mikes, M.D.

## 2015-06-10 NOTE — [facility] Group Notes (Signed)
[facility] Group Notes: (Clinical Social Work)   06/10/2015      Type of Therapy:  Group Therapy   Participation Level:  Did Not Attend despite MHT prompting   Ambrose Mantle, LCSW 06/10/2015, 12:43 PM

## 2015-06-10 NOTE — Progress Notes (Signed)
D   Pt is bizarre and appears to be responding to internal stimuli   He at first did not want to take his medication but did finally take it with encouragement   Pt is very paranoid and suspicious and appears to respond to internal stimuli and his affect is incongruent   Patient roaming the hallways seeming to have trouble focusing   Has been up to the nurses station asking for heat packs 4 or 5 times in the past 2 hours   He was seen with one of them in his pants A   Verbal  support given   Medications administered and effectiveness monitored   Q 15 min checks   Redirect as needed  Reassure of safety  R   Pt is safe and responds well to redirection

## 2015-06-10 NOTE — Progress Notes (Signed)
Patient ID: QUY LOTTS, male   DOB: March 14, 1996, 19 y.o.   MRN: [id number] Nursing Note: [id number]  D:  Mood is anxious at times; pacing up and down the hallway most of day, affect is flat.  Pt is not participating in group activities.  Noted  in room taking a shower (with shorts on) when the rest of unit left for lunch.  Took much prompting to encourage him to finish up and go to the lunchroom.  Pt found laughing out loud by self in the hallway a couple of times during shift.  He denies A/V hallucinations.   A:  !:1 time spent with pt, encouraged him to verbalize needs and concerns, active listening and support provided.  Continued Q 15 minute safety checks.  Ibuprofen given for complaints of headache, warm pack as well.  Pt. taking medications as ordered.  Pt observed to take 2 showers this shift, last time he walked out of room with wet socks and brushing teeth in hallway.  Pt instructed to dry body/feet prior to coming out of room as it is a safety issue when the floor gets wet.  Pt verbalized understanding.  R:  Pt. currently able to contract for safety and agrees to tell a staff member if he experiences SI or HI.

## 2015-06-11 MED ORDER — BENZTROPINE MESYLATE 1 MG PO TABS
1.0000 mg | ORAL_TABLET | Freq: Every day | ORAL | Status: DC
  Administered 2015-06-11: 1 mg via ORAL
  Filled 2015-06-11 (×2): qty 1
  Filled 2015-06-11: qty 7
  Filled 2015-06-11: qty 1

## 2015-06-11 MED ORDER — LAMOTRIGINE 25 MG PO TABS
50.0000 mg | ORAL_TABLET | Freq: Every day | ORAL | Status: DC
  Administered 2015-06-12: 50 mg via ORAL
  Filled 2015-06-11: qty 2
  Filled 2015-06-11: qty 14
  Filled 2015-06-11: qty 2

## 2015-06-11 MED ORDER — RISPERIDONE 3 MG PO TABS
3.0000 mg | ORAL_TABLET | Freq: Every day | ORAL | Status: DC
  Administered 2015-06-11: 3 mg via ORAL
  Filled 2015-06-11 (×3): qty 3
  Filled 2015-06-11: qty 7

## 2015-06-11 NOTE — Progress Notes (Signed)
Patient ID: [name], male   DOB: 04/27/1996, 19 y.o.   MRN: [id number] [facility] MD Progress Note  06/11/2015 4:12 PM [name]  MRN:  [id number] Subjective: Patient states "I am taking my medications. I am not messing with any of the other patient's here. I am doing what I am supposed to be doing."   Objective: Patient seen and chart reviewed.[name] is a 19 y.o., African-American, single male who presented to [facility] after being petitioned by his mother for involuntary commitment. Pt per initial notes in EHR stated that "I don't know why I am here some one just called the cops to come and get me". Pt denies SI, HI and AVH; however IVC papers stated that pt has been threatening to kill his family and yelling to the voices. "    Patient today continues to have periods when he is seen as restless , pacing the halls. Other than that he denies any AH/Paranoia. Has limited insight in to his illness or his substance use disorder. Pt encouraged to take medications.  Patient continues to be resistant to taking his psychotropic medications but took them last night after a great deal of encouragement from staff. Pt also with severe marijuana abuse - his current as well as previous admissions were likely 2/2 cannabis abuse.  His urine has been positive for marijuana on each admission. Per staff pt continues to need lot of encouragement and support. He was again encouraged to move his dreadlocks from face to make better eye contact with others on the unit. Patient also documented to be laughing to himself at times on the unit but denies this when directly asked by Clinical research associate.      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 Cannabis use disorder, severe, dependence [F12.20] 10/21/2014           Total Time spent with patient: 20 minutes   Past Medical History  Diagnosis Date  . ADHD (attention deficit hyperactivity disorder)    ADL's:  Intact  Sleep: Fair  Appetite:  Fair :  Psychiatric Specialty Exam: Physical Exam  Constitutional: He is oriented to person, place, and time.  HENT:  Head: Normocephalic.  Neck: Normal range of motion.  Respiratory: Effort normal.  Musculoskeletal: Normal range of motion.  Neurological: He is alert and oriented to person, place, and time.  Skin: Skin is warm and dry.  Psychiatric: His speech is normal. His mood appears anxious. He is not slowed and not withdrawn. Thought content is not paranoid. Cognition and memory are normal. He expresses impulsivity. He does not exhibit a depressed mood. He expresses no homicidal and no suicidal ideation.    Review of Systems  Constitutional: Negative.   HENT: Negative.   Eyes: Negative.   Respiratory: Negative.   Cardiovascular: Negative.   Gastrointestinal: Negative.   Genitourinary: Negative.   Musculoskeletal: Negative.   Skin: Negative.   Neurological: Negative.   Endo/Heme/Allergies: Negative.   Psychiatric/Behavioral: Positive for hallucinations (Denies but has been observed responding on the unit ) and substance abuse (THC). Negative for depression. The patient is nervous/anxious. The patient does not have insomnia.   All other systems reviewed and are negative.   Blood pressure 133/66, pulse 64, temperature  98.3 F (36.8 C), temperature source Oral, resp. rate 16, height 6' (1.829 m), weight 83.008 kg (183 lb).Body mass index is 24.81 kg/(m^2).  General Appearance: Bizarre and Guarded has braids falling over his eyes and face  Eye Contact::  Poor  Speech:  Normal Rate  Volume:  Decreased  Mood:  Anxious  Affect:  Flat  Thought Process:  Linear   Orientation:  Full (Time, Place, and Person)  Thought Content:  Paranoid Ideation  Suicidal Thoughts:  No  Homicidal Thoughts:  No  Memory:  Immediate;   Fair Recent;   Fair Remote;   Fair  Judgement:  Poor  Insight:  Lacking  Psychomotor Activity:  Normal  Concentration:  Fair  Recall:  [employer] of Knowledge:Fair  Language: Fair  Akathisia:  No  Handed:  Right  AIMS (if indicated):     Assets:  Physical Health  Sleep:  Number of Hours: 3.75   Musculoskeletal: Strength & Muscle Tone: within normal limits Gait & Station: normal Patient leans: N/A  Current Medications: Current Facility-Administered Medications  Medication Dose Route Frequency Provider Last Rate Last Dose  . acetaminophen (TYLENOL) tablet 650 mg  650 mg Oral Q6H PRN [name], NP   650 mg at 06/10/15 1505  . alum & mag hydroxide-simeth (MAALOX/MYLANTA) 200-200-20 MG/5ML suspension 30 mL  30 mL Oral Q4H PRN [name], NP      . benztropine (COGENTIN) tablet 0.5 mg  0.5 mg Oral QHS [name], MD   0.5 mg at 06/09/15 2150  . hydrOXYzine (ATARAX/VISTARIL) tablet 25 mg  25 mg Oral TID PRN [name], NP      . ibuprofen (ADVIL,MOTRIN) tablet 600 mg  600 mg Oral Q8H PRN [name], NP   600 mg at 06/11/15 0816  . lamoTRIgine (LAMICTAL) tablet 25 mg  25 mg Oral Daily [name], MD   25 mg at 06/11/15 0816  . magnesium hydroxide (MILK OF MAGNESIA) suspension 30 mL  30 mL Oral Daily PRN [name], NP      . ondansetron [facility]) tablet 4 mg  4 mg Oral Q8H PRN [name], NP      . polyethylene glycol (MIRALAX / GLYCOLAX) packet 17 g  17 g Oral BID [name], MD   17 g at 06/06/15 1736  . risperiDONE (RISPERDAL) tablet 2.5 mg  2.5 mg Oral QHS [name], MD   2.5 mg at 06/11/15 1610  . traZODone (DESYREL) tablet 50 mg  50 mg Oral QHS [name], NP   50 mg at 06/09/15 2150    Lab Results: No results found for this or any previous visit (from the past 48  hour(s)).  Physical Findings: AIMS: Facial and Oral Movements Muscles of Facial Expression: None, normal Lips and Perioral Area: None, normal Jaw: None, normal Tongue: None, normal,Extremity Movements Upper (arms, wrists, hands, fingers): None, normal Lower (legs, knees, ankles, toes): None, normal, Trunk Movements Neck, shoulders, hips: None, normal, Overall Severity Severity of abnormal movements (highest score from questions above): None, normal Incapacitation due to abnormal movements: None, normal Patient's awareness of abnormal movements (rate only patient's report): No Awareness, Dental Status Current problems with teeth and/or dentures?: No Does patient usually wear dentures?: No  CIWA:  CIWA-Ar Total: 1 COWS:  COWS Total Score: 1  Treatment Plan Summary: Daily contact with patient to assess and evaluate symptoms and progress in treatment Medication management  Assessment: Patient is a 19 year old  AAM ,who presented after having AH/ paranoia as well as agitation at home. Patient's UDS positive for THC. Pt continues to appear bizarre, paranoid , but denies any sx , continues to lack insight . Will continue medication changes.  Plan:  Will increase Risperidone to 3 mg po qhs for psychosis. Increase Cogentin to 1 mg po qhs for EPS. Incrase Lamictal to 50 mg po daily for mood lability. Pt likely with cannabis induced psychosis/mood sx- however will need to be sober for some time inorder to be diagnosed with a primary psychopathology - he does have a hx of bipolar do in his family. Will continue to monitor vitals ,medication compliance and treatment side effects while patient is here.  Will monitor for medical issues as well as call consult as needed.  Reviewed labs-pt refused all ordered labs today. EKG -qtc wnl. CSW will start working on disposition.  Patient to participate in therapeutic milieu .   Medical Decision Making Problem Points:  Established problem,  stable/improving (1), Review of last therapy session (1) and Review of psycho-social stressors (1) Data Points:  Review or order clinical lab tests (1) Review of medication regiment & side effects (2)  I certify that inpatient services furnished can reasonably be expected to improve the patient's condition.   Fransisca Kaufmann, NP-C 06/11/2015, 4:12 PM I agree with assessment and plan Madie Reno A. Dub Mikes, M.D.

## 2015-06-11 NOTE — Progress Notes (Signed)
Adult Psychoeducational Group Note  Date:  06/11/2015 Time:  11:10 AM  Group Topic/Focus: The focus of this group is to discuss a Healthy Support System, as well as rating depression.  Participation Level:  Active  Participation Quality:  Sharing  Affect:  Anxious  Cognitive:  Lacking  Insight: Lacking and Limited  Engagement in Group:  Limited  Modes of Intervention:  Clarification, Discussion, Education, Orientation and Support  Additional Comments:"They IVC me, my sister felt threaten by a text"; Rates depression= 10 while inappropriately laughing,  denies feeling helpless or hopeless; identifies his support system "mom and sister".     Celene Kras 06/11/2015, 11:10 AM

## 2015-06-11 NOTE — Progress Notes (Signed)
Pt alert, restless, pacing halls but cooperative. Affect/ mood flat and anxious. Appears to be responding to internal stimuli, frequently laughing to himself. " I'm thinking of getting discharge tomorrow, I don't like it here, its boring". C/o headache, see MAR. Emotional support and encouragement given. Will continue to monitor closely and evaluate for stabilization.

## 2015-06-11 NOTE — Progress Notes (Signed)
D: Pt has anxious affect and mood.  He paces the hallway frequently, laughing inappropriately at times.  He will suddenly start dancing at times.  Pt denies SI/HI, denies hallucinations, denies pain.  He appears to be responding to internal stimuli, as he is laughing randomly at times.  He states that he had a "good" day and he "went to the gym, groups, got some sleep."  Pt has been visible in milieu.  He did not attend evening group.    A: Introduced self to pt.  Met with pt 1:1 and provided support and encouragement.  Actively listened to pt.  Medications administered per order.   R: Pt is compliant with medications.  Pt verbally contracts for safety.  Will continue to monitor and assess.

## 2015-06-11 NOTE — [facility] Group Notes (Signed)
[facility] Group Notes:  (Clinical Social Work)  06/11/2015  [facility] Group Notes:  (Clinical Social Work)  06/11/2015  11:00AM-12:00PM  Summary of Progress/Problems:  The main focus of today's process group was to listen to a variety of genres of music and to identify that different types of music provoke different responses.  The patient then was able to identify personally what was soothing for them, as well as energizing.  Handouts were used to record feelings evoked, as well as how patient can personally use this knowledge in sleep habits, with depression, and with other symptoms.  The patient sat through almost the entire group smiling and seeming to enjoy the music, saying he did, but not talking other than brief comments.  Kept dreadlocks in his face the whole group, left prior to the end of group by about 5 minutes.  Type of Therapy:  Music Therapy   Participation Level:  Active  Participation Quality:  Attentive   Affect:  Not congruent  Cognitive:  Not assessed  Insight:  Limited  Engagement in Therapy: Improving  Modes of Intervention:   Activity, Exploration  Ambrose Mantle, LCSW 06/11/2015

## 2015-06-12 MED ORDER — LAMOTRIGINE 25 MG PO TABS: 50.0000 mg | ORAL_TABLET | Freq: Every day | ORAL | Status: DC

## 2015-06-12 MED ORDER — RISPERIDONE 3 MG PO TABS: 3.0000 mg | ORAL_TABLET | Freq: Every day | ORAL | Status: DC

## 2015-06-12 MED ORDER — BENZTROPINE MESYLATE 1 MG PO TABS: 1.0000 mg | ORAL_TABLET | Freq: Every day | ORAL | Status: DC

## 2015-06-12 MED ORDER — TRAZODONE HCL 50 MG PO TABS: 50.0000 mg | ORAL_TABLET | Freq: Every day | ORAL | Status: DC

## 2015-06-12 NOTE — Discharge Summary (Signed)
Physician Discharge Summary Note  Patient:  [name] is an 19 y.o., male MRN:  [id number] DOB:  May 17, 1996 Patient phone:  [phone] (home)  Patient address:   [address] 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 Cannabis use disorder, severe, dependence [F12.20] 10/21/2014    Musculoskeletal: Strength & Muscle Tone: within normal limits Gait & Station: normal Patient leans: N/A  Psychiatric Specialty Exam: Physical Exam  ROS  Blood pressure 137/64, pulse 66, temperature 97.3 F (36.3 C), temperature source Oral, resp. rate 16, height 6' (1.829 m), weight 83.008 kg (183 lb).Body mass index is 24.81 kg/(m^2).  See Physician SRA     Have you used any form of tobacco in the last 30 days? (Cigarettes, Smokeless Tobacco, Cigars, and/or Pipes): Yes  Has this patient used any form of tobacco in the last 30 days? (Cigarettes, Smokeless Tobacco, Cigars, and/or Pipes) Yes, Prescription provided for NICOTINE PATCH  Past Medical History:  Past Medical History  Diagnosis Date  . ADHD (attention deficit hyperactivity disorder)    History reviewed. No pertinent past surgical history. Family History:  Family History  Problem Relation Age of Onset  . Bipolar disorder Mother    Social History:  History  Alcohol Use No     History  Drug Use  . Yes  . Special: Marijuana    Social History   Social History  . Marital Status: Single    Spouse Name: N/A  . Number of Children: N/A  . Years of Education: N/A   Social History  Main Topics  . Smoking status: Current Every Day Smoker -- 0.25 packs/day    Types: Cigarettes  . Smokeless tobacco: None  . Alcohol Use: No  . Drug Use: Yes    Special: Marijuana  . Sexual Activity: Yes    Birth Control/ Protection: Condom   Other Topics Concern  . None   Social History Narrative   Risk to Self: Is patient at risk for suicide?: Yes Risk to Others:   Prior Inpatient Therapy:   Prior Outpatient Therapy:    Level of Care:  OP  Hospital Course:   [name] is an 19 y.o. male pt is presenting to [facility] after being petitioned by his mother for involuntary commitment. Pt stated "I don't know why I am here some just called the cops to come and get me". Pt denies SI, HI and AVH; however IVC papers stated that pt has been threatening to kill his family and yelling to the voices. Collateral information was gathered from pt's mother who reported that for the past month pt has been acting strange. She reported that pt has been trying to deal with the voices alone and has been yelling and hollering at them. She also reported that pt has not been eating, sleeping or drinking. She shared that she has to tell pt to take a shower and he will run the water for approximately 3 hours. She reported that pt is unable to live with her due to his behaviors and shared that whenever she picks him up to shower she has to call the police  to get him to leave. She also reported that pt is unable to stay with his siblings because he will stand over them while they are asleep. She reported that she got pt a motel room and while he was there he text her throughout the night stating that someone was trying ot kick in the door to kill him. She also reported that pt had a black knife and stated to her" you ain't gone be here neither". She reported that pt will also peep out of the bathroom and accuse family members of talking about him. She shared that will watching tv pt will be responding to the voices.  She shared that pt will also call law enforcement multiple times throughout the day and hold the phone. She also reported that pt refuses to take his medication because he thinks the he is being poisoned. She also shared that when she cooks dinner pt will watch her season the food and accuse her of putting poison in the food. Pt mother reported that the 5-7 day treatment is not helping her son and she would like for him to get medication while he is in the hospital. Pt mother reported that when he was released from inpatient earlier this year she had to contact police because pt was threatening her.          [name] was admitted to the adult 500 unit. He was evaluated and his symptoms were identified. Medication management was discussed and initiated. Patient had not been taking his psychiatric medications prior to admission. He was started on Risperdal for psychosis and Lamictal for mood lability. He was oriented to the unit and encouraged to participate in unit programming. Medical problems were identified and treated appropriately. Home medication was restarted as needed.        The patient was evaluated each day by a clinical provider to ascertain the patient's response to treatment. The patient was observed responding to internal stimuli during admission and he was noted to laugh to himself at times. During follow up assessments the patient would deny experiencing any psychotic symptoms. He appeared to have poor insight into his illness and minimized the report provided from his mother. Nursing staff reported that he needed redirection for making aggressive gestures towards a peer, which he denied doing when directly asked.  Improvement was noted by the patient's report of decreasing symptoms, improved sleep and appetite, affect, medication tolerance, behavior, and participation in unit programming.  He was asked each day to complete a self inventory noting mood, mental status, pain, new symptoms,  anxiety and concerns.         He responded well to medication and being in a therapeutic and supportive environment. Positive and appropriate behavior was noted and the patient was motivated for recovery.  The patient worked closely with the treatment team and case manager to develop a discharge plan with appropriate goals. Coping skills, problem solving as well as relaxation therapies were also part of the unit programming.         By the day of discharge he was in much improved condition than upon admission.  Symptoms were reported as significantly decreased or resolved completely. The patient denied SI/HI and voiced no AVH. He was motivated to continue taking medication with a goal of continued improvement in mental health. [name] was discharged home with a plan to follow up as noted below. Patient was encouraged to remain compliant with his psychiatric medications and to abstain from  illicit drugs.   Consults:  psychiatry  Significant Diagnostic Studies:  Chemistry profile, CBC, UDS positive for marijuana, EKG  Discharge Vitals:   Blood pressure 137/64, pulse 66, temperature 97.3 F (36.3 C), temperature source Oral, resp. rate 16, height 6' (1.829 m), weight 83.008 kg (183 lb). Body mass index is 24.81 kg/(m^2). Lab Results:   No results found for this or any previous visit (from the past 72 hour(s)).  Physical Findings: AIMS: Facial and Oral Movements Muscles of Facial Expression: None, normal Lips and Perioral Area: None, normal Jaw: None, normal Tongue: None, normal,Extremity Movements Upper (arms, wrists, hands, fingers): None, normal Lower (legs, knees, ankles, toes): None, normal, Trunk Movements Neck, shoulders, hips: None, normal, Overall Severity Severity of abnormal movements (highest score from questions above): None, normal Incapacitation due to abnormal movements: None, normal Patient's awareness of abnormal movements (rate only patient's report): No Awareness,  Dental Status Current problems with teeth and/or dentures?: No Does patient usually wear dentures?: No  CIWA:  CIWA-Ar Total: 1 COWS:  COWS Total Score: 1   See Psychiatric Specialty Exam and Suicide Risk Assessment completed by Attending Physician prior to discharge.  Discharge destination:  Home  Is patient on multiple antipsychotic therapies at discharge:  No   Has Patient had three or more failed trials of antipsychotic monotherapy by history:  No  Recommended Plan for Multiple Antipsychotic Therapies: NA     Medication List    STOP taking these medications        haloperidol 2 MG tablet  Commonly known as:  HALDOL      TAKE these medications      Indication   benztropine 1 MG tablet  Commonly known as:  COGENTIN  Take 1 tablet (1 mg total) by mouth at bedtime.   Indication:  Extrapyramidal Reaction caused by Medications     hydrOXYzine 25 MG tablet  Commonly known as:  ATARAX/VISTARIL  Take 25 mg (one tablet) three times a day as needed for anxiety/agitation   Indication:  anxiety and agitation     lamoTRIgine 25 MG tablet  Commonly known as:  LAMICTAL  Take 2 tablets (50 mg total) by mouth daily.   Indication:  Mood control     risperiDONE 3 MG tablet  Commonly known as:  RISPERDAL  Take 1 tablet (3 mg total) by mouth at bedtime.   Indication:  Psychosis     traZODone 50 MG tablet  Commonly known as:  DESYREL  Take 1 tablet (50 mg total) by mouth at bedtime.   Indication:  Trouble Sleeping       Follow-up Information    Follow up with [facility].   Why:  Go any Tuesday between 9 and noon for an assessment for services, including substance abuse groups and medication management   Contact information:   [address]  [address]  [place]  [phone]      Follow-up recommendations:   Activity: No restrictions Diet: regular Tests: as needed Other: follow up with after care- referral to a substance abuse program if patient  agrees  Comments:   Take all your medications as prescribed by your mental healthcare provider.  Report any adverse effects and or reactions from your medicines to your outpatient provider promptly.  Patient is instructed and cautioned to not engage in alcohol and or illegal drug use while on prescription medicines.  In the event of worsening symptoms, patient is instructed to call the crisis hotline, 911 and or  go to the nearest ED for appropriate evaluation and treatment of symptoms.  Follow-up with your primary care provider for your other medical issues, concerns and or health care needs.   Total Discharge Time: Greater than 30 minutes  Signed: Fransisca Kaufmann, NP-C 06/12/2015, 3:56 PM

## 2015-06-12 NOTE — Progress Notes (Signed)
Patient ID: [name], male   DOB: 1996/06/19, 19 y.o.   MRN: [id number] [name] was discharged to lobby where his ride awaited. He received and signed for belongings. [facility] received/signed for. Scripts and med samples were given. Reviewed crisis numbers with patient. Pt verbalized feeling ready for discharge.

## 2015-06-12 NOTE — BHH Suicide Risk Assessment (Signed)
[facility] Discharge Suicide Risk Assessment   Demographic Factors:  Male  Total Time spent with patient: 30 minutes  Musculoskeletal: Strength & Muscle Tone: within normal limits Gait & Station: normal Patient leans: N/A  Psychiatric Specialty Exam: Physical Exam  Review of Systems  Psychiatric/Behavioral: Positive for substance abuse. Negative for depression, suicidal ideas and hallucinations. The patient is not nervous/anxious and does not have insomnia.   All other systems reviewed and are negative.   Blood pressure 137/64, pulse 66, temperature 97.3 F (36.3 C), temperature source Oral, resp. rate 16, height 6' (1.829 m), weight 83.008 kg (183 lb).Body mass index is 24.81 kg/(m^2).  General Appearance: Fairly Groomed  Patent attorney::  Fair  Speech:  Clear and Coherent409  Volume:  Normal  Mood:  Euthymic  Affect:  Appropriate  Thought Process:  Coherent  Orientation:  Full (Time, Place, and Person)  Thought Content:  WDL  Suicidal Thoughts:  No  Homicidal Thoughts:  No  Memory:  Immediate;   Fair Recent;   Fair Remote;   Fair  Judgement:  Fair  Insight:  Shallow  Psychomotor Activity:  Normal  Concentration:  Fair  Recall:  [employer] of Knowledge:Fair  Language: Fair  Akathisia:  No  Handed:  Right  AIMS (if indicated):     Assets:  Desire for Improvement  Sleep:  Number of Hours: 3.75  Cognition: WNL  ADL's:  Intact   Have you used any form of tobacco in the last 30 days? (Cigarettes, Smokeless Tobacco, Cigars, and/or Pipes): Yes  Has this patient used any form of tobacco in the last 30 days? (Cigarettes, Smokeless Tobacco, Cigars, and/or Pipes) Yes, Prescription  provided for NICOTINE PATCH  Mental Status Per Nursing Assessment::   On Admission:  NA  Current Mental Status by Physician: Pt denies SI/HI/AH/VH  Loss Factors: NA  Historical Factors: Impulsivity  Risk Reduction Factors:   Positive social support  Continued Clinical Symptoms:   Alcohol/Substance Abuse/Dependencies  Cognitive Features That Contribute To Risk:  Thought constriction (tunnel vision)    Suicide Risk:  Minimal: No identifiable suicidal ideation.  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 Cannabis use disorder, severe, dependence [F12.20] 10/21/2014      Plan Of Care/Follow-up recommendations:  Activity:  No restrictions Diet:  regular Tests:  as needed Other:  follow up with after care- referral to a substance abuse program if patient agrees  Is patient on multiple antipsychotic therapies at discharge:  No   Has Patient had three or more failed trials of antipsychotic monotherapy by history:  No  Recommended Plan for Multiple Antipsychotic Therapies: NA    Mayzee Reichenbach MD 06/12/2015, 9:30 AM

## 2015-06-12 NOTE — BHH Suicide Risk Assessment (Signed)
B[facility] INPATIENT:  Family/Significant Other Suicide Prevention Education  Suicide Prevention Education:  Education Completed; No one has been identified by the patient as the family member/significant other with whom the patient will be residing, and identified as the person(s) who will aid the patient in the event of a mental health crisis (suicidal ideations/suicide attempt).  With written consent from the patient, the family member/significant other has been provided the following suicide prevention education, prior to the and/or following the discharge of the patient.  The suicide prevention education provided includes the following:  Suicide risk factors  Suicide prevention and interventions  National Suicide Hotline telephone number  [facility] assessment telephone number  [facility] Emergency Assistance 911  [facility] and/or Residential Mobile Crisis Unit telephone number  Request made of family/significant other to:  Remove weapons (e.g., guns, rifles, knives), all items previously/currently identified as safety concern.    Remove drugs/medications (over-the-counter, prescriptions, illicit drugs), all items previously/currently identified as a safety concern.  The family member/significant other verbalizes understanding of the suicide prevention education information provided.  The family member/significant other agrees to remove the items of safety concern listed above. The patient did not endorse SI at the time of admission, nor did the patient c/o SI during the stay here.  SPE not required. However, I did talk to his mother, Ms Little, 32 6, and talked with her about a crises plan.  [name] 06/12/2015, 10:47 AM

## 2015-06-12 NOTE — Tx Team (Signed)
Interdisciplinary Treatment Plan Update (Adult)  Date:  06/12/2015   Time Reviewed:  10:29 AM   Progress in Treatment: Attending groups: No Participating in groups:  No Taking medication as prescribed:  Yes. Tolerating medication:  Yes. Family/Significant othe contact made:  Yes Patient understands diagnosis:  No  Limited insight Discussing patient identified problems/goals with staff:  Yes, see initial care plan. Medical problems stabilized or resolved:  Yes. Denies suicidal/homicidal ideation: Yes. Issues/concerns per patient self-inventory:  No. Other:  New problem(s) identified:  Discharge Plan or Barriers: return home briefly, follow up outpt at [facility] if staying in the area.  [name] has been in touch with his brother in [place], and states his brother has invited him to join him there.  [name] says he will scrape together money he needs to take a bus there  Reason for Continuation of Hospitalization:   Comments:  [name] is an 19 y.o. male pt is presenting to [facility] after being petitioned by his mother for involuntary commitment. Pt stated "I don't know why I am here some just called the cops to come and get me". Pt denies SI, HI and AVH; however IVC papers stated that pt has been threatening to kill his family and yelling to the voices. Collateral information was gathered from pt's mother who reported that for the past month pt has been acting strange. She reported that pt has been trying to deal with the voices alone and has been yelling and hollering at them. She also reported that pt has not been eating, sleeping or drinking. She shared that she has to tell pt to take a shower and he will run the water for approximately 3 hours. She reported that pt is unable to live with her due to his behaviors and shared that whenever she picks him up to shower she has to call the police to get him to leave. She also reported that pt is unable to stay with his siblings because he will stand  over them while they are asleep.  Lamictal, Risperdal, Trazodone  Estimated length of stay: D/C today  New goal(s):  Review of initial/current patient goals per problem list:   Review of initial/current patient goals per problem list:  1. Goal(s): Patient will participate in aftercare plan   Met: Yes   Target date: 3-5 days post admission date   As evidenced by: Patient will participate within aftercare plan AEB aftercare provider and housing plan at discharge being identified.  06/07/15:Pt plans to return home, follow up outpt.        5. Goal(s): Patient will demonstrate decreased signs of psychosis  * Met: Yes  * Target date: 3-5 days post admission date  * As evidenced by: Patient will demonstrate decreased frequency of AVH or return to baseline function 06/07/15  Pt has been non-compliant with meds, smoking cannabis daily, displaying symptoms of paranoia and disorganization 06/12/2015 No signs nor symptoms of psychosis today    Attendees: Patient:  06/12/2015 10:29 AM   Family:   06/12/2015 10:29 AM   Physician:  Ursula Alert, MD 06/12/2015 10:29 AM   Nursing:   Manuella Ghazi, RN 06/12/2015 10:29 AM   CSW:    Roque Lias, LCSW   06/12/2015 10:29 AM   Other:  06/12/2015 10:29 AM   Other:   06/12/2015 10:29 AM   Other:  Lars Pinks, Nurse CM 06/12/2015 10:29 AM   Other:  Lucinda Dell, [facility] 06/12/2015 10:29 AM   Other:  Norberto Sorenson,  P4CC  06/12/2015 10:29 AM   Other:  06/12/2015 10:29 AM   Other:  06/12/2015 10:29 AM   Other:  06/12/2015 10:29 AM   Other:  06/12/2015 10:29 AM   Other:  06/12/2015 10:29 AM   Other:   06/12/2015 10:29 AM    Scribe for Treatment Team:   Trish Mage, 06/12/2015 10:29 AM

## 2015-06-12 NOTE — Progress Notes (Signed)
  [facility] Adult Case Management Discharge Plan :  Will you be returning to the same living situation after discharge:  Yes,  home briefly, until he can get money together for bus to [place] At discharge, do you have transportation home?: Yes,  mother or bus pass Do you have the ability to pay for your medications: Yes,  mental health  Release of information consent forms completed and in the chart;  Patient's signature needed at discharge.  Patient to Follow up at: Follow-up Information    Follow up with [facility].   Why:  Go any Tuesday between 9 and noon for an assessment for services, including substance abuse groups and medication management   Contact information:   [address]  [address]  [place]  [phone]      Patient denies SI/HI: Yes,  yes    Safety Planning and Suicide Prevention discussed: Yes,  yes  Have you used any form of tobacco in the last 30 days? (Cigarettes, Smokeless Tobacco, Cigars, and/or Pipes): Yes  Has patient been referred to the Quitline?: Yes, faxed on 06/12/15  Ida Rogue 06/12/2015, 10:53 AM

## 2017-02-23 ENCOUNTER — Encounter (HOSPITAL_COMMUNITY): Payer: Self-pay | Admitting: Emergency Medicine

## 2017-02-23 ENCOUNTER — Emergency Department (HOSPITAL_COMMUNITY)
Admission: EM | Admit: 2017-02-23 | Discharge: 2017-02-25 | Disposition: A | Discharge status: Other Acute Inpatient Hospital | Payer: Self-pay | Attending: Emergency Medicine | Admitting: Emergency Medicine

## 2017-02-23 DIAGNOSIS — F909 Attention-deficit hyperactivity disorder, unspecified type: Secondary | ICD-10-CM | POA: Insufficient documentation

## 2017-02-23 DIAGNOSIS — F17210 Nicotine dependence, cigarettes, uncomplicated: Secondary | ICD-10-CM | POA: Insufficient documentation

## 2017-02-23 DIAGNOSIS — Z79899 Other long term (current) drug therapy: Secondary | ICD-10-CM | POA: Insufficient documentation

## 2017-02-23 DIAGNOSIS — F12251 Cannabis dependence with psychotic disorder with hallucinations: Secondary | ICD-10-CM

## 2017-02-23 DIAGNOSIS — R4689 Other symptoms and signs involving appearance and behavior: Secondary | ICD-10-CM

## 2017-02-23 DIAGNOSIS — F12951 Cannabis use, unspecified with psychotic disorder with hallucinations: Secondary | ICD-10-CM | POA: Insufficient documentation

## 2017-02-23 LAB — COMPREHENSIVE METABOLIC PANEL
ALT: 27 U/L (ref 17–63)
AST: 47 U/L — AB (ref 15–41)
Albumin: 4.6 g/dL (ref 3.5–5.0)
Alkaline Phosphatase: 50 U/L (ref 38–126)
Anion gap: 9 (ref 5–15)
BUN: 18 mg/dL (ref 6–20)
CHLORIDE: 106 mmol/L (ref 101–111)
CO2: 25 mmol/L (ref 22–32)
Calcium: 9.3 mg/dL (ref 8.9–10.3)
Creatinine, Ser: 1.2 mg/dL (ref 0.61–1.24)
Glucose, Bld: 95 mg/dL (ref 65–99)
POTASSIUM: 3.9 mmol/L (ref 3.5–5.1)
Sodium: 140 mmol/L (ref 135–145)
TOTAL PROTEIN: 7.7 g/dL (ref 6.5–8.1)
Total Bilirubin: 1.2 mg/dL (ref 0.3–1.2)

## 2017-02-23 LAB — CBC WITH DIFFERENTIAL/PLATELET
BASOS ABS: 0 10*3/uL (ref 0.0–0.1)
BASOS PCT: 0 %
EOS PCT: 1 %
Eosinophils Absolute: 0.1 10*3/uL (ref 0.0–0.7)
HEMATOCRIT: 47.4 % (ref 39.0–52.0)
Hemoglobin: 16.7 g/dL (ref 13.0–17.0)
LYMPHS PCT: 21 %
Lymphs Abs: 1.5 10*3/uL (ref 0.7–4.0)
MCH: 32.1 pg (ref 26.0–34.0)
MCHC: 35.2 g/dL (ref 30.0–36.0)
MCV: 91.2 fL (ref 78.0–100.0)
Monocytes Absolute: 0.9 10*3/uL (ref 0.1–1.0)
Monocytes Relative: 12 %
NEUTROS ABS: 4.8 10*3/uL (ref 1.7–7.7)
Neutrophils Relative %: 66 %
PLATELETS: 210 10*3/uL (ref 150–400)
RBC: 5.2 MIL/uL (ref 4.22–5.81)
RDW: 13.9 % (ref 11.5–15.5)
WBC: 7.2 10*3/uL (ref 4.0–10.5)

## 2017-02-23 LAB — I-STAT CHEM 8, ED
BUN: 19 mg/dL (ref 6–20)
CHLORIDE: 105 mmol/L (ref 101–111)
Calcium, Ion: 1.11 mmol/L — ABNORMAL LOW (ref 1.15–1.40)
Creatinine, Ser: 1.3 mg/dL — ABNORMAL HIGH (ref 0.61–1.24)
GLUCOSE: 92 mg/dL (ref 65–99)
HEMATOCRIT: 50 % (ref 39.0–52.0)
HEMOGLOBIN: 17 g/dL (ref 13.0–17.0)
POTASSIUM: 3.9 mmol/L (ref 3.5–5.1)
Sodium: 142 mmol/L (ref 135–145)
TCO2: 26 mmol/L (ref 0–100)

## 2017-02-23 LAB — ETHANOL: Alcohol, Ethyl (B): <5 mg/dL (ref <5)

## 2017-02-23 NOTE — ED Notes (Signed)
SBAR Report received from previous nurse. Pt received calm and visible on unit. Pt ignored writer's questions related to  current SI/ HI, A/V H, depression, anxiety, or pain at this time, and appears otherwise stable and free of distress. Pt reminded of camera surveillance, q 15 min rounds, and rules of the milieu. Will continue to assess.

## 2017-02-23 NOTE — ED Notes (Signed)
Bed: [id number] Expected date:  Expected time:  Means of arrival:  Comments: Combative IVC pt

## 2017-02-23 NOTE — ED Notes (Signed)
Pt admitted to room #41. Pt reports he is here d/t "I had to walk all the way home last night from work [place] [employer], I got home and cussed everyone out."  Pt reports his mother made up lies on him. Pt not endorsing SI. Pt irritable, agitated, while this nurse was in pt room doing assessment, pt pushed his lunch tray in the floor. Encouragement and support provided. Special checks q 15 mins in place for safety. Video monitoring in place. Will continue to monitor.

## 2017-02-23 NOTE — ED Notes (Signed)
[facility] Expected date:  Expected time:  Means of arrival:  Comments: Hold for 25

## 2017-02-23 NOTE — Progress Notes (Signed)
CSW contacted patient's brother again, no answer. CSW spoke with patient at bedside regarding additional contact information for patient's brother. Patient reported that his brother may be at work and granted CSW verbal permission to contact Amil AmenJulia, patient's mother [phone]). CSW contacted patient's mother, patient's mother reported that patient is unable to go stay with his brothers due to the brothers having children. Patient's mother reported that he cannot return to her home either. Patient's mother reported that patient hears voices and that he speaks to the voices. She reported that patient is very disrespectful and threatens her often. Patient's mother reported that patient threatened to buy a gun and shoot everybody in the house. She reported that [facility] and [facility] and that his next appointment is scheduled for May 24th at 8:00am. CSW provided collateral information to Nurse Practitioner.   Celso SickleKimberly Clark Clowdus, LCSWA [facility]  Clinical Social Worker [phone]

## 2017-02-23 NOTE — BH Assessment (Addendum)
Tele Assessment Note   [name] is an 21 y.o. male who presents to the ED under IVC initiated by his mother. According to the IVC the pt has been threatening to kill his mother and told her he was going to purchase a gun when he gets paid in order to kill his mother. The pt has a hx of being IVC'd by his mother due to Schizophrenia, not taking his psych meds, and threatening to kill his family.   Pt was evaluated in ED on 06/03/2015 due to "under IVC paperwork for threatening to kill his mother." Pt reportedly has a hx of bipolar and schizophrenia. Pt was evaluated by [facility] on 06/03/2015 due to "IVC papers stated that pt has been threatening to kill his family and yelling to the voices." Pt denies SI, HI, AVH at current.   Pt denies SI, HI, or AVH to this assessor. Pt also denies drug use and states he does not know why his mother called the police. Note this is similar to the case in 2016 when the pt was IVC'd by his mother for making threats to kill her and upon evaluation, pt also denied to assessor in 2016 that he was homicidal and reports to not know why he was in the hospital. Pt's mother reports she is "afraid of him."  Per Nira Conn, NP pt will need an AM psych eval. Case discussed with Melene Plan, MD who is in agreement with disposition.   Diagnosis: hx of ADHD, hx of Schizophrenia   Past Medical History:  Past Medical History:  Diagnosis Date  . ADHD (attention deficit hyperactivity disorder)     History reviewed. No pertinent surgical history.  Family History:  Family History  Problem Relation Age of Onset  . Bipolar disorder Mother     Social History:  reports that he has been smoking Cigarettes.  He has been smoking about 0.25 packs per day. He has never used smokeless tobacco. He reports that he uses drugs, including Marijuana. He reports that he does not drink alcohol.  Additional Social History:  Alcohol / Drug Use Pain Medications: See PTA meds  Prescriptions: See  PTA meds  Over the Counter: See PTA meds  History of alcohol / drug use?: No history of alcohol / drug abuse  CIWA: CIWA-Ar BP: 139/89 Pulse Rate: 95 COWS:    PATIENT STRENGTHS: (choose at least two) Capable of independent living Metallurgist fund of knowledge Motivation for treatment/growth Physical Health  Allergies: No Known Allergies  Home Medications:  (Not in a hospital admission)  OB/GYN Status:  No LMP for male patient.  General Assessment Data Location of Assessment: [facility] TTS Assessment: In system Is this a Tele or Face-to-Face Assessment?: Face-to-Face Is this an Initial Assessment or a Re-assessment for this encounter?: Initial Assessment Marital status: Single Is patient pregnant?: No Pregnancy Status: No Living Arrangements: Parent Can pt return to current living arrangement?: Yes Admission Status: Involuntary Is patient capable of signing voluntary admission?: No Referral Source: Self/Family/Friend Insurance type: None     Crisis Care Plan Living Arrangements: Parent Name of Psychiatrist: pt denies Name of Therapist: pt denies  Education Status Is patient currently in school?: No Highest grade of school patient has completed: 12th  Risk to self with the past 6 months Suicidal Ideation: No Has patient been a risk to self within the past 6 months prior to admission? : No Suicidal Intent: No Has patient had any suicidal intent within  the past 6 months prior to admission? : No Is patient at risk for suicide?: No Suicidal Plan?: No Has patient had any suicidal plan within the past 6 months prior to admission? : No Access to Means: No What has been your use of drugs/alcohol within the last 12 months?: denies use  Previous Attempts/Gestures: No Triggers for Past Attempts: None known Intentional Self Injurious Behavior: None Family Suicide History: No Recent stressful life event(s): Conflict (Comment) (w/ mother  ) Persecutory voices/beliefs?: No Depression: No Substance abuse history and/or treatment for substance abuse?: No Suicide prevention information given to non-admitted patients: Not applicable  Risk to Others within the past 6 months Homicidal Ideation: No Does patient have any lifetime risk of violence toward others beyond the six months prior to admission? : No Thoughts of Harm to Others: No (pt denies, IVC states the pt makes threats to his mother) Current Homicidal Intent: No Current Homicidal Plan: No Access to Homicidal Means: No History of harm to others?: No Assessment of Violence: None Noted Does patient have access to weapons?: No (denies) Criminal Charges Pending?: No Does patient have a court date: No Is patient on probation?: No  Psychosis Hallucinations: None noted (IVC states the pt "hears voices" pt denies this ) Delusions: None noted  Mental Status Report Appearance/Hygiene: In scrubs Eye Contact: Poor Motor Activity: Freedom of movement Speech: Logical/coherent Level of Consciousness: Quiet/awake Mood: Sullen Affect: Flat Anxiety Level: None Thought Processes: Relevant, Coherent Judgement: Unimpaired Orientation: Person, Place, Time, Appropriate for developmental age, Situation Obsessive Compulsive Thoughts/Behaviors: None  Cognitive Functioning Concentration: Normal Memory: Remote Intact, Recent Impaired IQ: Average Insight: Good Impulse Control: Good Appetite: Good Sleep: No Change Total Hours of Sleep: 8 Vegetative Symptoms: None  ADLScreening [facility] Assessment Services) Patient's cognitive ability adequate to safely complete daily activities?: Yes Patient able to express need for assistance with ADLs?: Yes Independently performs ADLs?: Yes (appropriate for developmental age)  Prior Inpatient Therapy Prior Inpatient Therapy: Yes Prior Therapy Dates: 2016 Prior Therapy Facilty/Provider(s): [facility] Reason for Treatment: Schizophrenia   Prior  Outpatient Therapy Prior Outpatient Therapy: No Does patient have an ACCT team?: No Does patient have Intensive In-House Services?  : No Does patient have [facility] services? : No Does patient have P4CC services?: No  ADL Screening (condition at time of admission) Patient's cognitive ability adequate to safely complete daily activities?: Yes Is the patient deaf or have difficulty hearing?: No Does the patient have difficulty seeing, even when wearing glasses/contacts?: No Does the patient have difficulty concentrating, remembering, or making decisions?: No Patient able to express need for assistance with ADLs?: Yes Does the patient have difficulty dressing or bathing?: No Independently performs ADLs?: Yes (appropriate for developmental age) Does the patient have difficulty walking or climbing stairs?: No Weakness of Legs: None Weakness of Arms/Hands: None  Home Assistive Devices/Equipment Home Assistive Devices/Equipment: None    Abuse/Neglect Assessment (Assessment to be complete while patient is alone) Physical Abuse: Denies Verbal Abuse: Denies Sexual Abuse: Denies Exploitation of patient/patient's resources: Denies Self-Neglect: Denies     Merchant navy officer (For Healthcare) Does Patient Have a Medical Advance Directive?: No Would patient like information on creating a medical advance directive?: No - Patient declined    Additional Information 1:1 In Past 12 Months?: No CIRT Risk: No Elopement Risk: No Does patient have medical clearance?: Yes     Disposition:  Disposition Initial Assessment Completed for this Encounter: Yes Disposition of Patient: Other dispositions Other disposition(s): Other (Comment) (AM psych eval per Barbara Cower  Allyson SabalBerry, NP)  Karolee OhsAquicha R Sylvain Hasten 02/23/2017 6:46 AM

## 2017-02-23 NOTE — ED Provider Notes (Signed)
[facility]-EMERGENCY DEPT Provider Note   CSN: [id number] Arrival date & time: 02/23/17  0500     History   Chief Complaint Chief Complaint  Patient presents with  . IVC  . Aggressive Behavior    HPI [name] is a 21 y.o. male.  21 yo M with a chief complaint of aggressive behavior. Per the IVC paperwork the patient has been increasingly aggressive at home threatening mom and she is scared for her life. Per the patient he was not picked up from work as his mom had promised and then when he walked for a couple hours to make it home she had locked him out of the house. He banged on the door and the police were called. When they showed up he left. He then returned and sat on the steps. She had IVC paperwork filled out and had him brought to the hospital.   The history is provided by the patient, the EMS personnel and the police.  Illness  This is a new problem. The current episode started 1 to 2 hours ago. The problem occurs constantly. The problem has not changed since onset.Pertinent negatives include no chest pain, no abdominal pain, no headaches and no shortness of breath. Nothing aggravates the symptoms. Nothing relieves the symptoms. He has tried nothing for the symptoms. The treatment provided no relief.    Past Medical History:  Diagnosis Date  . ADHD (attention deficit hyperactivity disorder)     Patient Active Problem List   Diagnosis Date Noted  . 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 Cannabis use disorder, severe, dependence (HCC) 10/21/2014    History reviewed. No pertinent surgical history.     Home Medications    Prior to Admission medications   Medication Sig Start Date End Date Taking? Authorizing Provider  benztropine (COGENTIN) 1 MG tablet Take 1 tablet (1 mg total) by mouth at bedtime. 06/12/15   Thermon Leyland, NP  hydrOXYzine (ATARAX/VISTARIL) 25 MG tablet Take 25 mg (one  tablet) three times a day as needed for anxiety/agitation 10/26/14   Rankin, Shuvon B, NP  lamoTRIgine (LAMICTAL) 25 MG tablet Take 2 tablets (50 mg total) by mouth daily. 06/12/15   Thermon Leyland, NP  risperiDONE (RISPERDAL) 3 MG tablet Take 1 tablet (3 mg total) by mouth at bedtime. 06/12/15   Thermon Leyland, NP  traZODone (DESYREL) 50 MG tablet Take 1 tablet (50 mg total) by mouth at bedtime. 06/12/15   Thermon Leyland, NP    Family History Family History  Problem Relation Age of Onset  . Bipolar disorder Mother     Social History Social History  Substance Use Topics  . Smoking status: Current Every Day Smoker    Packs/day: 0.25    Types: Cigarettes  . Smokeless tobacco: Never Used  . Alcohol use No     Allergies   Patient has no known allergies.   Review of Systems Review of Systems  Constitutional: Negative for chills and fever.  HENT: Negative for congestion and facial swelling.   Eyes: Negative for discharge and visual disturbance.  Respiratory: Negative for shortness of breath.   Cardiovascular: Negative for chest pain and palpitations.  Gastrointestinal: Negative for abdominal pain, diarrhea and vomiting.  Musculoskeletal: Negative for arthralgias and myalgias.  Skin: Negative for color change and rash.  Neurological: Negative for tremors, syncope and headaches.  Psychiatric/Behavioral: Negative for confusion and dysphoric mood.     Physical  Exam Updated Vital Signs BP 139/89 (BP Location: Right Arm)   Pulse 95   Temp 97.5 F (36.4 C) (Oral)   Resp 20   SpO2 99%   Physical Exam  Constitutional: He is oriented to person, place, and time. He appears well-developed and well-nourished.  HENT:  Head: Normocephalic and atraumatic.  Eyes: EOM are normal. Pupils are equal, round, and reactive to light.  Neck: Normal range of motion. Neck supple. No JVD present.  Cardiovascular: Normal rate and regular rhythm.  Exam reveals no gallop and no friction rub.   No  murmur heard. Pulmonary/Chest: No respiratory distress. He has no wheezes.  Abdominal: He exhibits no distension. There is no rebound and no guarding.  Musculoskeletal: Normal range of motion.  Neurological: He is alert and oriented to person, place, and time.  Skin: No rash noted. No pallor.  Psychiatric: He has a normal mood and affect. His behavior is normal.  Nursing note and vitals reviewed.    ED Treatments / Results  Labs (all labs ordered are listed, but only abnormal results are displayed) Labs Reviewed - No data to display  EKG  EKG Interpretation None       Radiology No results found.  Procedures Procedures (including critical care time)  Medications Ordered in ED Medications - No data to display   Initial Impression / Assessment and Plan / ED Course  I have reviewed the triage vital signs and the nursing notes.  Pertinent labs & imaging results that were available during my care of the patient were reviewed by me and considered in my medical decision making (see chart for details).     21 yo M With a chief complaint of a argument with his mother. Patient is currently lucid denies hallucinations homicidal ideation or suicidal ideation. I do not feel that he needs labs for medical clearance. Feel he is medically clear at this time. TTS evaluate.  The patients results and plan were reviewed and discussed.   Any x-rays performed were independently reviewed by myself.   Differential diagnosis were considered with the presenting HPI.  Medications - No data to display  Vitals:   02/23/17 0507  BP: 139/89  Pulse: 95  Resp: 20  Temp: 97.5 F (36.4 C)  TempSrc: Oral  SpO2: 99%    Final diagnoses:  Aggressive behavior      Final Clinical Impressions(s) / ED Diagnoses   Final diagnoses:  Aggressive behavior    New Prescriptions New Prescriptions   No medications on file     Melene PlanFloyd, Okechukwu Regnier, DO 02/23/17 [id number]

## 2017-02-23 NOTE — Progress Notes (Signed)
Per psychiatrist Aktar request, CSW attempted to contact patient's brother Gabriel RungJoe [phone]), no answer. CSW left voicemail requesting return phone call. CSW will continue to try and reach patient's brother.   Celso SickleKimberly Michole Lecuyer, LCSWA [facility]  Clinical Social Worker [phone]

## 2017-02-23 NOTE — ED Triage Notes (Signed)
Brought in by [employer] from home under IVC papers for aggressions.  Per IVC paper:  "My son is diagnosed with schizophrenia and takes shots from Nurse Stark JockAnn Brady every month.  He is prescribed other medications but does not take them.  [facility]  He smokes marijuana everyday.  He hears voices and talks to them all the time.  He is very aggressive, cursing at me and threatening to kill me and hurt me.  He has spit at me and calls me "crack head whore" and so forth threatening to break my neck.  He throws things at me all the time and tonight I had to call the police.  He tells me that when he gets paid he is going to buy a gun and kill everybody.  My son is a danger to me and others.  I am afraid of him."

## 2017-02-24 DIAGNOSIS — R45850 Homicidal ideations: Secondary | ICD-10-CM

## 2017-02-24 DIAGNOSIS — F12251 Cannabis dependence with psychotic disorder with hallucinations: Secondary | ICD-10-CM

## 2017-02-24 DIAGNOSIS — F17210 Nicotine dependence, cigarettes, uncomplicated: Secondary | ICD-10-CM

## 2017-02-24 DIAGNOSIS — Z818 Family history of other mental and behavioral disorders: Secondary | ICD-10-CM

## 2017-02-24 MED ORDER — LAMOTRIGINE 25 MG PO TABS
50.0000 mg | ORAL_TABLET | Freq: Every day | ORAL | Status: DC
  Filled 2017-02-24 (×2): qty 2

## 2017-02-24 MED ORDER — IBUPROFEN 200 MG PO TABS
600.0000 mg | ORAL_TABLET | Freq: Four times a day (QID) | ORAL | Status: DC | PRN
  Administered 2017-02-24: 600 mg via ORAL
  Filled 2017-02-24 (×2): qty 3

## 2017-02-24 MED ORDER — TRAZODONE HCL 50 MG PO TABS
50.0000 mg | ORAL_TABLET | Freq: Every day | ORAL | Status: DC
Start: 2017-02-24 — End: 2017-02-25

## 2017-02-24 MED ORDER — RISPERIDONE 1 MG PO TABS
3.0000 mg | ORAL_TABLET | Freq: Every day | ORAL | Status: DC
Start: 2017-02-24 — End: 2017-02-25

## 2017-02-24 MED ORDER — HYDROXYZINE HCL 25 MG PO TABS: 25.0000 mg | ORAL_TABLET | Freq: Three times a day (TID) | ORAL | Status: DC | PRN

## 2017-02-24 MED ORDER — BENZTROPINE MESYLATE 1 MG PO TABS: 1.0000 mg | ORAL_TABLET | Freq: Every day | ORAL | Status: DC

## 2017-02-24 NOTE — Consult Note (Signed)
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 Cannabis use disorder, severe, dependence ([place]) [F12.20] 10/21/2014    Total Time spent with patient: 45 minutes  Subjective:   [name] is a 21 y.o. male patient admitted with psychosis.  HPI:  21 yo male who presented to the ED under IVC after threatening to kill his mother and talking to the voices.  Today, he minimizes these actions and states he was upset because he had to walk home from [place] [employer] [place] he [place] and had to wait three hours before his mother returned home.  He does not expand on what happened at this point.  At this time, he has not given a urine for testing.  Denies suicidal ideations and alcohol abuse, does use marijuana.  Responding to internal stimuli, paranoia present.  Past Psychiatric History: cannabis abuse, depression, psychosis  Risk to Self: Suicidal Ideation: No Suicidal Intent: No Is patient at risk for suicide?: No Suicidal Plan?: No Access to Means: No What has been your use of drugs/alcohol within the last 12 months?: denies use  Triggers for Past Attempts: None known Intentional Self Injurious Behavior: None Risk to Others: Mother Prior Inpatient Therapy: Prior Inpatient Therapy: Yes Prior Therapy Dates: 2016 Prior Therapy Facilty/Provider(s): [facility] Reason for Treatment: Schizophrenia  Prior Outpatient Therapy: Prior Outpatient Therapy: No Does patient have an ACCT team?: No Does patient have Intensive In-House Services?   : No Does patient have [facility] services? : No Does patient have P4CC services?: No  Past Medical History:  Past Medical History:  Diagnosis Date  . ADHD (attention deficit hyperactivity disorder)    History reviewed. No pertinent surgical history. Family History:  Family History  Problem Relation Age of Onset  . Bipolar disorder Mother    Family Psychiatric  History: none Social History:  History  Alcohol Use No     History  Drug Use  . Types: Marijuana    Social History   Social History  . Marital status: Single    Spouse name: N/A  . Number of children: N/A  . Years of education: N/A   Social History Main Topics  . Smoking status: Current Every Day Smoker    Packs/day: 0.25    Types: Cigarettes  . Smokeless tobacco: Never Used  . Alcohol use No  . Drug use: Yes    Types: Marijuana  . Sexual activity: Yes    Birth control/ protection: Condom   Other Topics Concern  . None   Social History Narrative  . None   Additional Social History:    Allergies:   Allergies  Allergen Reactions  . Morphine And Related Hives    Per mother     Labs:  Results for orders placed or performed during the hospital encounter of 02/23/17 (from the past 48 hour(s))  Comprehensive metabolic panel     Status: Abnormal   Collection Time: 02/23/17 12:34 PM  Result Value Ref Range   Sodium 140 135 - 145 mmol/L   Potassium 3.9 3.5 - 5.1 mmol/L   Chloride 106 101 - 111 mmol/L  CO2 25 22 - 32 mmol/L   Glucose, Bld 95 65 - 99 mg/dL   BUN 18 6 - 20 mg/dL   Creatinine, Ser 1.20 0.61 - 1.24 mg/dL   Calcium 9.3 8.9 - 10.3 mg/dL   Total Protein 7.7 6.5 - 8.1 g/dL   Albumin 4.6 3.5 - 5.0 g/dL   AST 47 (H) 15 - 41 U/L   ALT 27 17 - 63 U/L   Alkaline Phosphatase 50 38 - 126 U/L   Total Bilirubin 1.2 0.3 - 1.2 mg/dL   GFR calc non Af Amer >60 >60 mL/min   GFR calc Af Amer >60 >60 mL/min    Comment: (NOTE) The eGFR has been calculated using the CKD EPI equation. This calculation  has not been validated in all clinical situations. eGFR's persistently <60 mL/min signify possible Chronic Kidney Disease.    Anion gap 9 5 - 15  Ethanol     Status: None   Collection Time: 02/23/17 12:34 PM  Result Value Ref Range   Alcohol, Ethyl (B) <5 <5 mg/dL    Comment:        LOWEST DETECTABLE LIMIT FOR SERUM ALCOHOL IS 5 mg/dL FOR MEDICAL PURPOSES ONLY   CBC with Diff     Status: None   Collection Time: 02/23/17 12:34 PM  Result Value Ref Range   WBC 7.2 4.0 - 10.5 K/uL   RBC 5.20 4.22 - 5.81 MIL/uL   Hemoglobin 16.7 13.0 - 17.0 g/dL   HCT 47.4 39.0 - 52.0 %   MCV 91.2 78.0 - 100.0 fL   MCH 32.1 26.0 - 34.0 pg   MCHC 35.2 30.0 - 36.0 g/dL   RDW 13.9 11.5 - 15.5 %   Platelets 210 150 - 400 K/uL   Neutrophils Relative % 66 %   Neutro Abs 4.8 1.7 - 7.7 K/uL   Lymphocytes Relative 21 %   Lymphs Abs 1.5 0.7 - 4.0 K/uL   Monocytes Relative 12 %   Monocytes Absolute 0.9 0.1 - 1.0 K/uL   Eosinophils Relative 1 %   Eosinophils Absolute 0.1 0.0 - 0.7 K/uL   Basophils Relative 0 %   Basophils Absolute 0.0 0.0 - 0.1 K/uL  I-Stat Chem 8, ED  (not at [facility])     Status: Abnormal   Collection Time: 02/23/17 12:44 PM  Result Value Ref Range   Sodium 142 135 - 145 mmol/L   Potassium 3.9 3.5 - 5.1 mmol/L   Chloride 105 101 - 111 mmol/L   BUN 19 6 - 20 mg/dL   Creatinine, Ser 1.30 (H) 0.61 - 1.24 mg/dL   Glucose, Bld 92 65 - 99 mg/dL   Calcium, Ion 1.11 (L) 1.15 - 1.40 mmol/L   TCO2 26 0 - 100 mmol/L   Hemoglobin 17.0 13.0 - 17.0 g/dL   HCT 50.0 39.0 - 52.0 %    Current Facility-Administered Medications  Medication Dose Route Frequency Provider Last Rate Last Dose  . benztropine (COGENTIN) tablet 1 mg  1 mg Oral QHS Cornel Werber, MD      . hydrOXYzine (ATARAX/VISTARIL) tablet 25 mg  25 mg Oral TID PRN Regnald Bowens, MD      . lamoTRIgine (LAMICTAL) tablet 50 mg  50 mg Oral Daily Adrean Findlay, MD      . risperiDONE (RISPERDAL) tablet 3 mg  3 mg Oral QHS  Elveta Rape, MD      . traZODone (DESYREL) tablet 50 mg  50 mg Oral QHS Corena Pilgrim, MD  Current Outpatient Prescriptions  Medication Sig Dispense Refill  . naproxen sodium (ANAPROX) 220 MG tablet Take 220 mg by mouth 2 (two) times daily with a meal. Tooth pain    . PRESCRIPTION MEDICATION Inject 400 mg into the muscle every 30 (thirty) days. abilify ER '400mg'$  injection    . benztropine (COGENTIN) 1 MG tablet Take 1 tablet (1 mg total) by mouth at bedtime. (Patient not taking: Reported on 02/23/2017) 30 tablet 0  . hydrOXYzine (ATARAX/VISTARIL) 25 MG tablet Take 25 mg (one tablet) three times a day as needed for anxiety/agitation (Patient not taking: Reported on 02/23/2017) 45 tablet 0  . lamoTRIgine (LAMICTAL) 25 MG tablet Take 2 tablets (50 mg total) by mouth daily. (Patient not taking: Reported on 02/23/2017) 60 tablet 0  . risperiDONE (RISPERDAL) 3 MG tablet Take 1 tablet (3 mg total) by mouth at bedtime. (Patient not taking: Reported on 02/23/2017) 30 tablet 0  . traZODone (DESYREL) 50 MG tablet Take 1 tablet (50 mg total) by mouth at bedtime. (Patient not taking: Reported on 02/23/2017) 30 tablet 0    Musculoskeletal: Strength & Muscle Tone: within normal limits Gait & Station: normal Patient leans: N/A  Psychiatric Specialty Exam: Physical Exam  Constitutional: He is oriented to person, place, and time. He appears well-developed and well-nourished.  HENT:  Head: Normocephalic.  Neck: Normal range of motion.  Respiratory: Effort normal.  Musculoskeletal: Normal range of motion.  Neurological: He is alert and oriented to person, place, and time.  Psychiatric: His speech is normal. Judgment normal. His affect is labile. He is actively hallucinating. Cognition and memory are normal. He expresses homicidal ideation. He expresses homicidal plans.    Review of Systems  Psychiatric/Behavioral: Positive for hallucinations and substance abuse.  All other systems reviewed and are  negative.   Blood pressure (!) 136/54, pulse 91, temperature 98 F (36.7 C), temperature source Oral, resp. rate 19, SpO2 92 %.There is no height or weight on file to calculate BMI.  General Appearance: Casual  Eye Contact:  Fair  Speech:  Normal Rate  Volume:  Normal  Mood:  Depressed  Affect:  Blunt  Thought Process:  Coherent and Descriptions of Associations: Intact  Orientation:  Full (Time, Place, and Person)  Thought Content:  Hallucinations: Auditory and Paranoid Ideation  Suicidal Thoughts:  No  Homicidal Thoughts:  Yes.  with intent/plan  Memory:  Immediate;   Fair Recent;   Fair Remote;   Fair  Judgement:  Impaired  Insight:  Lacking  Psychomotor Activity:  Normal  Concentration:  Concentration: Fair and Attention Span: Fair  Recall:  [employer] of Knowledge:  Fair  Language:  Good  Akathisia:  No  Handed:  Right  AIMS (if indicated):     Assets:  Housing Leisure Time Physical Health Resilience Social Support Vocational/Educational  ADL's:  Intact  Cognition:  WNL  Sleep:        Treatment Plan Summary: Daily contact with patient to assess and evaluate symptoms and progress in treatment, Medication management and Plan cannabis abuse   -Crisis stabilization -Medication management:  Started Lamictal 50 mg daily for mood stabilization, Risperdal 3 mg at bedtime for psychosis, Vistaril 25 mg TID for anxiety, Cogentin 1 mg at bedtime for EPS, and Trazodone 50 mg at bedtime PRN sleep -Individual counseling  Disposition: Recommend psychiatric Inpatient admission when medically cleared.  Waylan Boga, NP 02/24/2017 5:17 PM  Patient seen face-to-face for psychiatric evaluation, chart reviewed and case discussed with the physician extender  and developed treatment plan. Reviewed the information documented and agree with the treatment plan. Corena Pilgrim, MD

## 2017-02-24 NOTE — ED Notes (Signed)
Introduced self to patient. Pt oriented to unit expectations.  Assessed pt for:  A) Anxiety &/or agitation: Pt has been calm in the SAPPU today, staying in bed mostly. He is polite. Refused medications. Talks with his hand in front of his mouth.   S) Safety: Safety maintained with q-15-minute checks and hourly rounds by staff.  A) ADLs: Pt able to perform ADLs independently.  P) Pick-Up (room cleanliness): Pt's room clean and free of clutter.

## 2017-02-24 NOTE — ED Notes (Signed)
Ms. Clarene DukeLittle called stating that she is pts mom. Staff didn't confirm pt location or information. Ms. Clarene DukeLittle stated that she wanted to talk to the doctor. She left her number [phone].

## 2017-02-24 NOTE — Progress Notes (Signed)
02/24/17 1341:  LRT introduced self to pt and offered activities.  Pt was just pacing in his room and did not give a response as to whether he wanted to do activities.  Caroll RancherMarjette Montrez [place], LRT/CTRS

## 2017-02-24 NOTE — ED Notes (Signed)
SBAR Report received from previous nurse. Pt received calm and visible on unit. Pt denies current SI/ HI, A/V H, depression, anxiety, or pain at this time, and appears otherwise stable and free of distress though he covers his mouth whenever he speaks. Pt reminded of camera surveillance, q 15 min rounds, and rules of the milieu. Will continue to assess.

## 2017-02-25 ENCOUNTER — Encounter (HOSPITAL_COMMUNITY): Payer: Self-pay | Admitting: *Deleted

## 2017-02-25 ENCOUNTER — Inpatient Hospital Stay (HOSPITAL_COMMUNITY)
Admission: AD | Admit: 2017-02-25 | Discharge: 2017-03-07 | DRG: 885 | Disposition: A | Discharge status: Home / Self Care | Payer: No Typology Code available for payment source | Attending: Psychiatry | Admitting: Psychiatry

## 2017-02-25 DIAGNOSIS — Z79899 Other long term (current) drug therapy: Secondary | ICD-10-CM

## 2017-02-25 DIAGNOSIS — Z818 Family history of other mental and behavioral disorders: Secondary | ICD-10-CM | POA: Diagnosis not present

## 2017-02-25 DIAGNOSIS — F17210 Nicotine dependence, cigarettes, uncomplicated: Secondary | ICD-10-CM | POA: Diagnosis present

## 2017-02-25 DIAGNOSIS — F419 Anxiety disorder, unspecified: Secondary | ICD-10-CM | POA: Diagnosis present

## 2017-02-25 DIAGNOSIS — F1210 Cannabis abuse, uncomplicated: Secondary | ICD-10-CM | POA: Diagnosis present

## 2017-02-25 DIAGNOSIS — F1290 Cannabis use, unspecified, uncomplicated: Secondary | ICD-10-CM | POA: Diagnosis not present

## 2017-02-25 DIAGNOSIS — F250 Schizoaffective disorder, bipolar type: Secondary | ICD-10-CM | POA: Diagnosis present

## 2017-02-25 DIAGNOSIS — F909 Attention-deficit hyperactivity disorder, unspecified type: Secondary | ICD-10-CM | POA: Diagnosis present

## 2017-02-25 DIAGNOSIS — F1220 Cannabis dependence, uncomplicated: Secondary | ICD-10-CM | POA: Diagnosis not present

## 2017-02-25 DIAGNOSIS — F12159 Cannabis abuse with psychotic disorder, unspecified: Secondary | ICD-10-CM | POA: Diagnosis not present

## 2017-02-25 LAB — RAPID URINE DRUG SCREEN, HOSP PERFORMED
Amphetamines: NOT DETECTED
Barbiturates: NOT DETECTED
Benzodiazepines: NOT DETECTED
Cocaine: NOT DETECTED
Opiates: NOT DETECTED
Tetrahydrocannabinol: POSITIVE — AB

## 2017-02-25 MED ORDER — ALUM & MAG HYDROXIDE-SIMETH 200-200-20 MG/5ML PO SUSP: 30.0000 mL | ORAL | Status: DC | PRN

## 2017-02-25 MED ORDER — ACETAMINOPHEN 325 MG PO TABS
650.0000 mg | ORAL_TABLET | Freq: Four times a day (QID) | ORAL | Status: DC | PRN
  Administered 2017-02-27 – 2017-03-03 (×5): 650 mg via ORAL
  Filled 2017-02-25 (×5): qty 2

## 2017-02-25 MED ORDER — TRAZODONE HCL 50 MG PO TABS
50.0000 mg | ORAL_TABLET | Freq: Every day | ORAL | Status: DC
  Administered 2017-02-25: 50 mg via ORAL
  Filled 2017-02-25 (×3): qty 1

## 2017-02-25 MED ORDER — MAGNESIUM HYDROXIDE 400 MG/5ML PO SUSP: 30.0000 mL | Freq: Every day | ORAL | Status: DC | PRN

## 2017-02-25 MED ORDER — IBUPROFEN 600 MG PO TABS
600.0000 mg | ORAL_TABLET | Freq: Four times a day (QID) | ORAL | Status: DC | PRN
  Administered 2017-02-25 – 2017-02-26 (×2): 600 mg via ORAL
  Filled 2017-02-25 (×2): qty 1

## 2017-02-25 MED ORDER — RISPERIDONE 3 MG PO TABS
3.0000 mg | ORAL_TABLET | Freq: Every day | ORAL | Status: DC
  Administered 2017-02-25 – 2017-03-02 (×5): 3 mg via ORAL
  Filled 2017-02-25 (×3): qty 1
  Filled 2017-02-25: qty 3
  Filled 2017-02-25 (×4): qty 1

## 2017-02-25 MED ORDER — LAMOTRIGINE 25 MG PO TABS
50.0000 mg | ORAL_TABLET | Freq: Every day | ORAL | Status: DC
  Administered 2017-02-26 – 2017-03-04 (×7): 50 mg via ORAL
  Filled 2017-02-25 (×8): qty 2

## 2017-02-25 MED ORDER — BENZTROPINE MESYLATE 1 MG PO TABS
1.0000 mg | ORAL_TABLET | Freq: Every day | ORAL | Status: DC
  Administered 2017-02-25 – 2017-03-06 (×10): 1 mg via ORAL
  Filled 2017-02-25 (×2): qty 1
  Filled 2017-02-25: qty 7
  Filled 2017-02-25 (×10): qty 1

## 2017-02-25 MED ORDER — HYDROXYZINE HCL 25 MG PO TABS
25.0000 mg | ORAL_TABLET | Freq: Three times a day (TID) | ORAL | Status: DC | PRN
  Administered 2017-03-01 – 2017-03-02 (×2): 25 mg via ORAL
  Filled 2017-02-25 (×3): qty 1

## 2017-02-25 NOTE — ED Notes (Signed)
Pt refusing morning medication. When pt speaks, he covers his mouth, pt denies pain. Pt denies SI/HI/AVH. Encouragement and support provided. Will continue to monitor.

## 2017-02-25 NOTE — ED Notes (Signed)
Pt mother- [name]; called nurses station (Consent to release information on chart) POC discussed with pt mother.

## 2017-02-25 NOTE — Tx Team (Signed)
Initial Treatment Plan 02/25/2017 8:32 PM Orlan LeavensJohn C Urbach ZOX:[id number]    PATIENT STRESSORS: Marital or family conflict   PATIENT STRENGTHS: Average or above average intelligence Communication skills Supportive family/friends   PATIENT IDENTIFIED PROBLEMS: Other-Directed Violence  Psychosis  "Family conflict"  "Miscommunication"               DISCHARGE CRITERIA:  Ability to meet basic life and health needs Improved stabilization in mood, thinking, and/or behavior Motivation to continue treatment in a less acute level of care Need for constant or close observation no longer present Verbal commitment to aftercare and medication compliance  PRELIMINARY DISCHARGE PLAN: Outpatient therapy Return to previous living arrangement Return to previous work or school arrangements  PATIENT/FAMILY INVOLVEMENT: This treatment plan has been presented to and reviewed with the patient, Orlan LeavensJohn C Hafley.  The patient and family have been given the opportunity to ask questions and make suggestions.  Carleene OverlieMiddleton, Shamanda Len P, RN 02/25/2017, 8:32 PM

## 2017-02-25 NOTE — ED Notes (Signed)
[employer] called for transport 

## 2017-02-25 NOTE — Progress Notes (Addendum)
Patient ID: Zachary LeavensJohn C [name], male   DOB: 10/06/1996, 21 y.o.   MRN: [id number]  Pt currently presents with a sullen affect and guarded behavior. Pt has no initiation, forwards little to Clinical research associatewriter. Pt seen in dayroom, limited interaction with peers. Seen pacing in the hallway at times with his hand over his mouth. Main complaint is current tooth pain, awakened from sleep due to pain. Pt reports good sleep with current medication regimen.   Pt provided with medications per providers orders. Pt's labs and vitals were monitored throughout the night. Pt given a 1:1 about emotional and mental status. Pt supported and encouraged to express concerns and questions. Pt educated on medications. Provider notified of patients current complaint.   Pt's safety ensured with 15 minute and environmental checks. Pt currently denies SI/HI and A/V hallucinations. Pt verbally agrees to seek staff if SI/HI or A/VH occurs and to consult with staff before acting on any harmful thoughts. Pt started on antibiotic for tooth inflammation. Will continue POC.

## 2017-02-25 NOTE — ED Notes (Signed)
This nurse in pt room to provide specimen cup and encourage pt to provide urine per MD order. Pt became verbally aggressive towards this nurse, and reports he will not be providing urine. This nurse notified Dr. Mervyn SkeetersA. Special checks q 15 mins in place for safety. Video monitoring in place. Will continue to monitor.

## 2017-02-25 NOTE — ED Notes (Signed)
[facility]. Pt signed for personal property and property given to [employer] for transfer. No s/s of distress noted. Pt ambulatory off unit in police custody.

## 2017-02-25 NOTE — Progress Notes (Signed)
Admission Note:  21 year old male who presents IVC, in no acute distress, for the treatment of HI. Patient states that he had an argument with his mother which resulted in his mother taking out IVC paperwork on patient.  Patient states "They said that I said I was going to kill them" referring to mother and her boyfriend.  Patient denies ever threatening to kill his mother or her boyfriend and denies current HI.  Patient appears flat.  Patient was calm and cooperative with admission process. Patient covered his mouth with his hand while answering admission questions. Patient denies SI and contracts for safety upon admission. Patient denies AVH. Per report, patient was responding to internal stimuli.  Patient did not appear to be responding to internal stimuli during admission.  Patient reports that verbal altercation with his mother was due to patient's mother not picking patient up from work and patient having to walk 4 hours to get home.  Patient currently lives with his mother and stays with his brother at times.  Patient identifies his mother and brother as his support systems.  [facility], patient would like to work on "family conflict" and "miscommunication".  Skin was assessed and found to be clear of any abnormal marks.  Patient searched and no contraband found, POC and unit policies explained and understanding verbalized. Consents obtained.  Patient had no additional questions or concerns.

## 2017-02-25 NOTE — Progress Notes (Signed)
Adult Psychoeducational Group Note  Date:  02/25/2017 Time:  9:03 PM  Group Topic/Focus:  Wrap-Up Group:   The focus of this group is to help patients review their daily goal of treatment and discuss progress on daily workbooks.  Participation Level:  Minimal  Participation Quality:  Appropriate  Affect:  Flat  Cognitive:  Appropriate  Insight: Appropriate  Engagement in Group:  Lacking  Modes of Intervention:  Discussion  Additional Comments:  Pt stated that his day was alright. He does not have any goals at this time.   Kaleen OdeaCOOKE, Jewelz Ricklefs R 02/25/2017, 9:03 PM

## 2017-02-25 NOTE — Progress Notes (Signed)
02/25/17 1358:  LRT went to pt room to offer activities.  LRT informed pt what activities were available and pt requested some coloring sheets.  LRT made copies of coloring sheets for pt and gave them to him.  Pt seemed reserved and quiet.  Pt would talk with his hand in front of his mouth.  Caroll RancherMarjette Vedanth Sirico, LRT/CTRS

## 2017-02-26 ENCOUNTER — Encounter (HOSPITAL_COMMUNITY): Payer: Self-pay | Admitting: Psychiatry

## 2017-02-26 DIAGNOSIS — F1220 Cannabis dependence, uncomplicated: Secondary | ICD-10-CM

## 2017-02-26 DIAGNOSIS — F250 Schizoaffective disorder, bipolar type: Principal | ICD-10-CM

## 2017-02-26 DIAGNOSIS — Z818 Family history of other mental and behavioral disorders: Secondary | ICD-10-CM

## 2017-02-26 MED ORDER — OLANZAPINE 5 MG PO TABS
5.0000 mg | ORAL_TABLET | Freq: Three times a day (TID) | ORAL | Status: DC | PRN
  Administered 2017-03-06: 5 mg via ORAL
  Filled 2017-02-26: qty 2

## 2017-02-26 MED ORDER — CLONIDINE HCL ER 0.1 MG PO TB12
0.1000 mg | ORAL_TABLET | Freq: Every day | ORAL | Status: DC
  Administered 2017-02-26 – 2017-03-06 (×9): 0.1 mg via ORAL
  Filled 2017-02-26: qty 1
  Filled 2017-02-26: qty 7
  Filled 2017-02-26 (×9): qty 1

## 2017-02-26 MED ORDER — OLANZAPINE 10 MG IM SOLR: 5.0000 mg | Freq: Three times a day (TID) | INTRAMUSCULAR | Status: DC | PRN

## 2017-02-26 MED ORDER — CEPHALEXIN 500 MG PO CAPS
500.0000 mg | ORAL_CAPSULE | Freq: Three times a day (TID) | ORAL | Status: DC
  Administered 2017-02-26 – 2017-03-07 (×27): 500 mg via ORAL
  Filled 2017-02-26 (×9): qty 1
  Filled 2017-02-26: qty 2
  Filled 2017-02-26 (×12): qty 1
  Filled 2017-02-26: qty 21
  Filled 2017-02-26 (×9): qty 1
  Filled 2017-02-26: qty 21
  Filled 2017-02-26: qty 1
  Filled 2017-02-26: qty 21

## 2017-02-26 MED ORDER — TRAZODONE HCL 50 MG PO TABS
50.0000 mg | ORAL_TABLET | Freq: Every evening | ORAL | Status: DC | PRN
  Administered 2017-02-26 – 2017-03-02 (×4): 50 mg via ORAL
  Filled 2017-02-26 (×3): qty 1

## 2017-02-26 MED ORDER — BENZOCAINE 10 % MT GEL
Freq: Four times a day (QID) | OROMUCOSAL | Status: DC | PRN
  Administered 2017-02-26 – 2017-03-01 (×4): via OROMUCOSAL
  Administered 2017-03-02: 1 via OROMUCOSAL
  Administered 2017-03-02: 13:00:00 via OROMUCOSAL
  Filled 2017-02-26: qty 9.4

## 2017-02-26 NOTE — Progress Notes (Signed)
Recreation Therapy Notes  INPATIENT RECREATION THERAPY ASSESSMENT  Patient Details Name: Orlan LeavensJohn C Drumgoole MRN: [id number] DOB: [birth year] Today's Date: 02/26/2017  Patient Stressors: Work  Pt stated he was here for threatening his mother and her boyfriend.  Coping Skills:   Isolate, Avoidance, Exercise, Talking, Music  Personal Challenges: Communication, Problem-Solving  Leisure Interests (2+):  Individual - TV, Games - Video games  Awareness of Community Resources:  Yes  Community Resources:  Research scientist (physical sciences)Movie Theaters, Tree surgeonMall, Other (Comment) (Skating rink)  Current Use: Yes  Patient Strengths:  "I don't know"  Patient Identified Areas of Improvement:  "I don't know"  Current Recreation Participation:  Everyday  Patient Goal for Hospitalization:  "Try to get out of here"  [place]  [place]  Current SI (including self-harm):  No  Current HI:  No  Consent to Intern Participation: N/A   Caroll RancherMarjette Dayzha Pogosyan, LRT/CTRS  Caroll RancherLindsay, Aveyah Greenwood A 02/26/2017, 2:38 PM

## 2017-02-26 NOTE — Progress Notes (Signed)
Patient denies SI, HI and AVH this shift. Patient has been compliant with medications and attended groups. Patient has had no incidents of behavioral dyscontrol.  Assess patient for safety, offer medications as prescribed, engage patient in 1:1 staff talks.   Continue to monitor as prescribed. Patient able to contract for safety 

## 2017-02-26 NOTE — Progress Notes (Signed)
Psychoeducational Group Note  Date:  02/26/2017 Time:  2113  Group Topic/Focus:  Wrap-Up Group:   The focus of this group is to help patients review their daily goal of treatment and discuss progress on daily workbooks.  Participation Level: Did Not Attend  Participation Quality:  Not Applicable  Affect:  Not Applicable  Cognitive:  Not Applicable  Insight:  Not Applicable  Engagement in Group: Not Applicable  Additional Comments:  The patient did not attend group this evening.   Anabel Lykins S 02/26/2017, 9:13 PM 

## 2017-02-26 NOTE — Progress Notes (Signed)
Recreation Therapy Notes  Date: 02/26/17 Time: 1000 Location: 500 Hall Dayroom  Group Topic: Self-Esteem  Goal Area(s) Addresses:  Patient will identify positive ways to increase self-esteem. Patient will verbalize benefit of increased self-esteem.  Behavioral Response: Observed  Intervention: Blank masks, colored pencils  Activity: How I See Me.  Patients were given a blank mask.  On the front of the mask, patients were to drawn or write how they view themselves.  On the back of the mask, patients were to write how other people see them.  Education: Self-Esteem, Building control surveyorDischarge Planning.   Education Outcome: Acknowledges education/In group clarification offered/Needs additional education  Clinical Observations/Feedback: Pt sat quietly by the door and observed.  Pt left early with doctor and did not return.   Caroll RancherMarjette Kyrstin Campillo, LRT/CTRS        Caroll RancherLindsay, Exander Shaul A 02/26/2017 1:43 PM

## 2017-02-26 NOTE — Progress Notes (Signed)
D: Pt was in his room upon initial approach.  Pt presents with blunted affect and suspicious mood.  He reports his day was "alright."  Pt denies having a goal today.  Writer and pt made a goal to "have a good, safe night."  Pt denies SI/HI, denies hallucinations, denies pain.  Pt stayed in his room for the majority of the night.  He did not attend evening group.   A: Introduced self to pt.  Actively listened to pt and offered support and encouragement. Medications administered per order.  PRN medication administered for sleep.  Q15 minute safety checks maintained.  R: Pt is safe on the unit.  Pt is compliant with medications.  Pt verbally contracts for safety.  Will continue to monitor and assess.

## 2017-02-26 NOTE — [facility] Group Notes (Signed)
[facility] LCSW Group Therapy  02/26/2017 3:45 PM   Type of Therapy:  Group Therapy  Participation Level:  Active  Participation Quality:  Attentive  Affect:  Appropriate  Cognitive:  Appropriate  Insight:  Improving  Engagement in Therapy:  Engaged  Modes of Intervention:  Clarification, Education, Exploration and Socialization  Summary of Progress/Problems: Today's group focused on relapse prevention.  We defined the term, and then brainstormed on ways to prevent relapse. Invited.  Chose to not attend.  Daryel Geraldorth, Catlynn Grondahl B 02/26/2017 , 3:45 PM

## 2017-02-26 NOTE — [facility] Suicide Risk Assessment (Signed)
[facility]   Nursing information obtained from:  Patient Demographic factors:  Male, Adolescent or young adult Current Mental Status:  NA Loss Factors:  NA Historical Factors:  NA Risk Reduction Factors:  Living with another person, especially a relative, Positive social support  Total Time spent with patient: 30 minutes Principal Problem: Schizoaffective disorder, bipolar type (HCC) Diagnosis:   Patient Active Problem List   Diagnosis Date Noted  . Schizoaffective disorder, bipolar type (HCC) [F25.0] 02/26/2017  . 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 Cannabis use disorder, severe, dependence (HCC) [F12.20] 10/21/2014   Subjective Data: Please see H&P.   Continued Clinical Symptoms:  Alcohol Use Disorder Identification Test Final Score (AUDIT): 1 The "Alcohol Use Disorders Identification Test", Guidelines for Use in Primary Care, Second Edition.  World Science writerHealth Organization [facility](WHO). Score between 0-7:  no or low risk or alcohol related problems. Score between 8-15:  moderate risk of alcohol related problems. Score between 16-19:  high risk of alcohol related problems. Score 20 or above:  warrants further diagnostic evaluation for alcohol dependence and treatment.   CLINICAL FACTORS:   Alcohol/Substance Abuse/Dependencies Previous Psychiatric Diagnoses and Treatments   Musculoskeletal: Strength & Muscle Tone: within normal limits Gait & Station: normal Patient leans: N/A  Psychiatric Specialty Exam: Physical Exam  ROS  Blood pressure 131/89, pulse (!) 107, temperature 98.1 F (36.7 C), temperature source Oral, resp. rate 16, height 5' 11.25" (1.81 m), weight 100.2 kg (221 lb).Body mass index is 30.61 kg/m.          Please see H&P.                                                 COGNITIVE FEATURES THAT CONTRIBUTE TO RISK:   Closed-mindedness, Polarized thinking and Thought constriction (tunnel vision)    SUICIDE RISK:   Mild:  Suicidal ideation of limited frequency, intensity, duration, and specificity.  There are no identifiable plans, no associated intent, mild dysphoria and related symptoms, good self-control (both objective and subjective assessment), few other risk factors, and identifiable protective factors, including available and accessible social support.  PLAN OF CARE: Please see H&P.   I certify that inpatient services furnished can reasonably be expected to improve the patient's condition.   Shyler Hamill, MD 02/26/2017, 2:43 PM
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
## 2017-02-26 NOTE — [facility] Suicide Risk Assessment (Signed)
[facility] INPATIENT:  Family/Significant Other Suicide Prevention Education  Suicide Prevention Education:  Education Completed; No one has been identified by the patient as the family member/significant other with whom the patient will be residing, and identified as the person(s) who will aid the patient in the event of a mental health crisis (suicidal ideations/suicide attempt).  With written consent from the patient, the family member/significant other has been provided the following suicide prevention education, prior to the and/or following the discharge of the patient.  The suicide prevention education provided includes the following:  Suicide risk factors  Suicide prevention and interventions  National Suicide Hotline telephone number  [facility] assessment telephone number  [facility] Emergency Assistance 911  [facility] and/or Residential Mobile Crisis Unit telephone number  Request made of family/significant other to:  Remove weapons (e.g., guns, rifles, knives), all items previously/currently identified as safety concern.    Remove drugs/medications (over-the-counter, prescriptions, illicit drugs), all items previously/currently identified as a safety concern.  The family member/significant other verbalizes understanding of the suicide prevention education information provided.  The family member/significant other agrees to remove the items of safety concern listed above. The patient did not endorse SI at the time of admission, nor did the patient c/o SI during the stay here.  SPE not required. However, I did talk with mother, [name], [phone].  She stated that he had been staying in a hotel after his last release a year ago, but due to increasing paranoia, she took him back into the house.  Then he got fired from [employer] for smoking a blunt outside during his break.  After that, he became increasingly paranoid, irritable and threatening, to the the  point that she finally IVC'd him.  She insisted he has been going for his shot regularly, and knows this because she threatens to bring him back here if he does not go, so he has always complied. She does not want to bring him back home, and says no other family can take him in because of children in the home, but is willing to consider if he there is a dramatic turn around on new medication.  We went over treatment team recommendations and crises plan  Ida RogueRodney B Eda Magnussen 02/26/2017, 4:22 PM

## 2017-02-27 DIAGNOSIS — F1290 Cannabis use, unspecified, uncomplicated: Secondary | ICD-10-CM

## 2017-02-27 DIAGNOSIS — F17210 Nicotine dependence, cigarettes, uncomplicated: Secondary | ICD-10-CM

## 2017-02-27 LAB — BASIC METABOLIC PANEL
Anion gap: 11 (ref 5–15)
BUN: 19 mg/dL (ref 6–20)
CALCIUM: 9.4 mg/dL (ref 8.9–10.3)
CHLORIDE: 106 mmol/L (ref 101–111)
CO2: 23 mmol/L (ref 22–32)
CREATININE: 1.4 mg/dL — AB (ref 0.61–1.24)
GFR calc Af Amer: >60 mL/min (ref >60)
GFR calc non Af Amer: >60 mL/min (ref >60)
GLUCOSE: 155 mg/dL — AB (ref 65–99)
Potassium: 3.6 mmol/L (ref 3.5–5.1)
Sodium: 140 mmol/L (ref 135–145)

## 2017-02-27 LAB — LIPID PANEL
CHOL/HDL RATIO: 3.2 ratio
CHOLESTEROL: 176 mg/dL (ref 0–200)
HDL: 55 mg/dL (ref >40)
LDL CALC: 109 mg/dL — AB (ref 0–99)
Triglycerides: 61 mg/dL (ref <150)
VLDL: 12 mg/dL (ref 0–40)

## 2017-02-27 LAB — TSH: TSH: 1.106 u[IU]/mL (ref 0.350–4.500)

## 2017-02-27 NOTE — Progress Notes (Signed)
Patient denies SI, HI and AVH this shift. Patient has been compliant with medications and attended groups. Patient has had no incidents of behavioral dyscontrol.  Assess patient for safety, offer medications as prescribed, engage patient in 1:1 staff talks.   Continue to monitor as prescribed. Patient able to contract for safety

## 2017-02-27 NOTE — Tx Team (Signed)
Interdisciplinary Treatment and Diagnostic Plan Update  02/27/2017 Time of Session: 8:17 AM  [name] MRN: [id number]  Principal Diagnosis: Schizoaffective disorder, bipolar type ([place])  Secondary Diagnoses: Principal Problem:   Schizoaffective disorder, bipolar type ([place]) Active Problems:   Cannabis use disorder, severe, dependence ([place])   Current Medications:  Current Facility-Administered Medications  Medication Dose Route Frequency Provider Last Rate Last Dose  . acetaminophen (TYLENOL) tablet 650 mg  650 mg Oral Q6H PRN Patrecia Pour, NP      . alum & mag hydroxide-simeth (MAALOX/MYLANTA) 200-200-20 MG/5ML suspension 30 mL  30 mL Oral Q4H PRN Patrecia Pour, NP      . benzocaine (ORAJEL) 10 % mucosal gel   Mouth/Throat QID PRN Laverle Hobby, PA-C      . benztropine (COGENTIN) tablet 1 mg  1 mg Oral QHS Patrecia Pour, NP   1 mg at 02/26/17 2116  . cephALEXin (KEFLEX) capsule 500 mg  500 mg Oral Q8H Simon, Spencer E, PA-C   500 mg at 02/27/17 3559  . cloNIDine HCl (KAPVAY) ER tablet 0.1 mg  0.1 mg Oral QHS Eappen, Saramma, MD   0.1 mg at 02/26/17 2116  . hydrOXYzine (ATARAX/VISTARIL) tablet 25 mg  25 mg Oral TID PRN Patrecia Pour, NP      . ibuprofen (ADVIL,MOTRIN) tablet 600 mg  600 mg Oral Q6H PRN Patrecia Pour, NP   600 mg at 02/26/17 0238  . lamoTRIgine (LAMICTAL) tablet 50 mg  50 mg Oral Daily Patrecia Pour, NP   50 mg at 02/27/17 7416  . magnesium hydroxide (MILK OF MAGNESIA) suspension 30 mL  30 mL Oral Daily PRN Patrecia Pour, NP      . OLANZapine (ZYPREXA) tablet 5 mg  5 mg Oral TID PRN Ursula Alert, MD       Or  . OLANZapine (ZYPREXA) injection 5 mg  5 mg Intramuscular TID PRN Eappen, Ria Clock, MD      . risperiDONE (RISPERDAL) tablet 3 mg  3 mg Oral QHS Patrecia Pour, NP   3 mg at 02/26/17 2116  . traZODone (DESYREL) tablet 50 mg  50 mg Oral QHS PRN Ursula Alert, MD   50 mg at 02/26/17 2115    PTA Medications: Prescriptions Prior to Admission   Medication Sig Dispense Refill Last Dose  . benztropine (COGENTIN) 1 MG tablet Take 1 tablet (1 mg total) by mouth at bedtime. (Patient not taking: Reported on 02/23/2017) 30 tablet 0 Not Taking at Unknown time  . hydrOXYzine (ATARAX/VISTARIL) 25 MG tablet Take 25 mg (one tablet) three times a day as needed for anxiety/agitation (Patient not taking: Reported on 02/23/2017) 45 tablet 0 Not Taking at Unknown time  . lamoTRIgine (LAMICTAL) 25 MG tablet Take 2 tablets (50 mg total) by mouth daily. (Patient not taking: Reported on 02/23/2017) 60 tablet 0 Not Taking at Unknown time  . naproxen sodium (ANAPROX) 220 MG tablet Take 220 mg by mouth 2 (two) times daily with a meal. Tooth pain   02/22/2017 at Unknown time  . PRESCRIPTION MEDICATION Inject 400 mg into the muscle every 30 (thirty) days. abilify ER 444m injection   APRIL  . risperiDONE (RISPERDAL) 3 MG tablet Take 1 tablet (3 mg total) by mouth at bedtime. (Patient not taking: Reported on 02/23/2017) 30 tablet 0 Not Taking at Unknown time  . traZODone (DESYREL) 50 MG tablet Take 1 tablet (50 mg total) by mouth at bedtime. (Patient not taking: Reported on  02/23/2017) 30 tablet 0 Not Taking at Unknown time    Treatment Modalities: Medication Management, Group therapy, Case management,  1 to 1 session with clinician, Psychoeducation, Recreational therapy.   Physician Treatment Plan for Primary Diagnosis: Schizoaffective disorder, bipolar type ([place]) Long Term Goal(s): Improvement in symptoms so as ready for discharge  Short Term Goals: Ability to verbalize feelings will improve Compliance with prescribed medications will improve Ability to identify triggers associated with substance abuse/mental health issues will improve Ability to verbalize feelings will improve Compliance with prescribed medications will improve Ability to identify triggers associated with substance abuse/mental health issues will improve  Medication Management: Evaluate  patient's response, side effects, and tolerance of medication regimen.  Therapeutic Interventions: 1 to 1 sessions, Unit Group sessions and Medication administration.  Evaluation of Outcomes: Progressing  Physician Treatment Plan for Secondary Diagnosis: Principal Problem:   Schizoaffective disorder, bipolar type ([place]) Active Problems:   Cannabis use disorder, severe, dependence ([place])   Long Term Goal(s): Improvement in symptoms so as ready for discharge  Short Term Goals: Ability to verbalize feelings will improve Compliance with prescribed medications will improve Ability to identify triggers associated with substance abuse/mental health issues will improve Ability to verbalize feelings will improve Compliance with prescribed medications will improve Ability to identify triggers associated with substance abuse/mental health issues will improve  Medication Management: Evaluate patient's response, side effects, and tolerance of medication regimen.  Therapeutic Interventions: 1 to 1 sessions, Unit Group sessions and Medication administration.  Evaluation of Outcomes: Progressing   RN Treatment Plan for Primary Diagnosis: Schizoaffective disorder, bipolar type ([place]) Long Term Goal(s): Knowledge of disease and therapeutic regimen to maintain health will improve  Short Term Goals: Ability to identify and develop effective coping behaviors will improve and Compliance with prescribed medications will improve  Medication Management: RN will administer medications as ordered by provider, will assess and evaluate patient's response and provide education to patient for prescribed medication. RN will report any adverse and/or side effects to prescribing provider.  Therapeutic Interventions: 1 on 1 counseling sessions, Psychoeducation, Medication administration, Evaluate responses to treatment, Monitor vital signs and CBGs as ordered, Perform/monitor CIWA, COWS, AIMS and Fall Risk screenings  as ordered, Perform wound care treatments as ordered.  Evaluation of Outcomes: Progressing    Recreational Therapy Treatment Plan for Primary Diagnosis: Schizoaffective disorder, bipolar type ([place]) Long Term Goal(s): Patient will participate in recreation therapy treatment in at least 2 group sessions without prompting from LRT  Short Term Goals: Patient will demonstrate increased ability to follow instructions, as demonstrated by ability to follow LRT instructions on first prompt during recreation therapy group sessions  Treatment Modalities: Group and Pet Therapy  Therapeutic Interventions: Psychoeducation  Evaluation of Outcomes: Progressing   LCSW Treatment Plan for Primary Diagnosis: Schizoaffective disorder, bipolar type ([place]) Long Term Goal(s): Safe transition to appropriate next level of care at discharge, Engage patient in therapeutic group addressing interpersonal concerns.  Short Term Goals: Engage patient in aftercare planning with referrals and resources  Therapeutic Interventions: Assess for all discharge needs, 1 to 1 time with Social worker, Explore available resources and support systems, Assess for adequacy in community support network, Educate family and significant other(s) on suicide prevention, Complete Psychosocial Assessment, Interpersonal group therapy.  Evaluation of Outcomes: Met  Return home, follow up [facility]   Progress in Treatment: Attending groups: No Participating in groups: No Taking medication as prescribed: Yes Toleration medication: Yes, no side effects reported at this time Family/Significant other contact made: Yes Patient  understands diagnosis: No  Limited insight Discussing patient identified problems/goals with staff: Yes Medical problems stabilized or resolved: Yes Denies suicidal/homicidal ideation: Yes Issues/concerns per patient self-inventory: None Other: N/A  New problem(s) identified: None identified at this time.   New Short  Term/Long Term Goal(s): None identified at this time.   Discharge Plan or Barriers:   Reason for Continuation of Hospitalization: Paranoia Hallucinations Medication stabilization   Estimated Length of Stay: 3-5 days  Attendees: Patient: 02/27/2017  8:17 AM  Physician: Neita Garnet, MD 02/27/2017  8:17 AM  Nursing: Elesa Massed, RN 02/27/2017  8:17 AM  RN Care Manager: Lars Pinks, RN 02/27/2017  8:17 AM  Social Worker: Ripley Fraise 02/27/2017  8:17 AM  Recreational Therapist: Victorino Sparrow, LRT/CTRS 02/27/2017  8:17 AM  Other: Norberto Sorenson 02/27/2017  8:17 AM  Other:  02/27/2017  8:17 AM    Scribe for Treatment Team:  Roque Lias LCSW 02/27/2017 8:17 AM
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
## 2017-02-27 NOTE — Progress Notes (Signed)
Recreation Therapy Notes  Date: 02/27/17 Time: 1000 Location: 500 Hall Dayroom  Group Topic: Communication, Team Building, Problem Solving  Goal Area(s) Addresses:  Patient will effectively work with peer towards shared goal.  Patient will identify skill used to make activity successful.  Patient will identify how skills used during activity can be used to reach post d/c goals.   Behavioral Response: Minimal  Intervention: STEM Activity   Activity: [place]. Patients were provided the following materials: 5 drinking straws, 5 rubber bands, 5 paper clips, 2 index cards, 2 drinking cups, and 2 toilet paper rolls. Using the provided materials patients were asked to build a launching mechanisms to launch a ping pong ball approximately 12 feet. Patients were divided into teams of 3-5.   Education: Pharmacist, communityocial Skills, Building control surveyorDischarge Planning.   Education Outcome: Acknowledges education/In group clarification offered/Needs additional education.   Clinical Observations/Feedback: Pt gave suggestions to compete the launcher.  Pt kept his hand in front of his mouth the whole time.  Pt left early and did not return.   Caroll RancherMarjette Chelbi Herber, LRT/CTRS         Caroll RancherLindsay, Darel Ricketts A 02/27/2017 12:45 PM

## 2017-02-27 NOTE — [facility] Group Notes (Signed)
[facility]  02/27/2017 , 1:48 PM    Type of Therapy:  [facility] Presentation  Participation Level:  Active  Participation Quality:  Attentive  Affect:  Blunted  Cognitive:  Oriented  Insight:  Limited  Engagement in Therapy:  Engaged  Modes of Intervention:  Discussion, Education and Socialization  Summary of Progress/Problems:  Onalee HuaDavid from [facility] came to present his recovery story and play the guitar.  Was walking in the hall, invited to join us, came if for 5 minutes, left saying he had to go to the bathroom.  Did not return.  Daryel Geraldorth, Noelani Harbach B 02/27/2017 , 1:48 PM
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
## 2017-02-28 LAB — HEMOGLOBIN A1C
HEMOGLOBIN A1C: 5.2 % (ref 4.8–5.6)
MEAN PLASMA GLUCOSE: 103 mg/dL

## 2017-02-28 MED ORDER — IBUPROFEN 800 MG PO TABS
800.0000 mg | ORAL_TABLET | Freq: Four times a day (QID) | ORAL | Status: DC | PRN
Start: 2017-02-28 — End: 2017-03-07
  Administered 2017-03-01 – 2017-03-06 (×8): 800 mg via ORAL
  Filled 2017-02-28 (×8): qty 1
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
## 2017-02-28 NOTE — Progress Notes (Signed)
Adult Psychoeducational Group Note  Date:  02/28/2017 Time:  8:59 PM  Group Topic/Focus:  Wrap-Up Group:   The focus of this group is to help patients review their daily goal of treatment and discuss progress on daily workbooks.  Participation Level:  Active  Participation Quality:  Appropriate  Affect:  Appropriate  Cognitive:  Appropriate  Insight: Appropriate  Engagement in Group:  Engaged  Modes of Intervention:  Discussion  Additional Comments: The patient expressed that he attended groups and rates today a 7.  Octavio Mannshigpen, [place] Lee 02/28/2017, 8:59 PM

## 2017-02-28 NOTE — [facility] Group Notes (Signed)
[facility] LCSW Group Therapy  02/28/2017  1:05 PM  Type of Therapy:  Group therapy  Participation Level:  Active  Participation Quality:  Attentive  Affect:  Flat  Cognitive:  Oriented  Insight:  Limited  Engagement in Therapy:  Limited  Modes of Intervention:  Discussion, Socialization  Summary of Progress/Problems:  Chaplain was here to lead a group on themes of hope and courage. Invited.  Chose to not attend. Daryel Geraldorth, Sahana Boyland B 02/28/2017 1:03 PM

## 2017-02-28 NOTE — Progress Notes (Signed)
D: Patient denies SI/HI or AVH. Patient has a depressed mood and flat affect.  He secludes to his room the majority of the day and did not attend afternoon group with the chaplain.  Pt. Forwards little but responds with coherent answers when addressed.  He denies any pain and states that he is sleeping and eating well.  A: Patient given emotional support from RN. Patient encouraged to come to staff with concerns and/or questions. Patient's medication routine continued. Patient's orders and plan of care reviewed.   R: Patient remains appropriate and cooperative. Will continue to monitor patient q15 minutes for safety.

## 2017-02-28 NOTE — Progress Notes (Signed)
Recreation Therapy Notes  Date: 02/28/17 Time: 1000 Location: 500 Hall Dayroom  Group Topic: Anger Management  Goal Area(s) Addresses:  Patient will identify triggers for anger.  Patient will identify physical reaction to anger.   Patient will identify benefit of using coping skills when angry.  Behavioral Response: Minimal  Intervention: Thermometer worksheet, pencils  Activity:  Anger Thermometer.  Patients were given a sheet with a thermometer on it.  The thermometer was numbered from 1-10.  Patients were to rank the things that get them angry, 1- being the calmest to 10- being the angriest.  Patients were to also identify 5 coping skills to deal with what gets them angry.  Education: Anger Management, Discharge Planning   Education Outcome: Acknowledges education/In group clarification offered/Needs additional education.   Clinical Observations/Feedback: Pt stated yelling and nagging makes him angry.  Pt stated he uses deep breaths, walking away and smoke cigarettes as his coping skills.  Pt expressed using these coping skills helps him calm down.   Caroll RancherMarjette Ileanna Gemmill, LRT/CTRS        Caroll RancherLindsay, [name] 02/28/2017 11:56 AM

## 2017-03-01 MED ORDER — RISPERIDONE 1 MG PO TABS
1.0000 mg | ORAL_TABLET | ORAL | Status: DC
  Administered 2017-03-02 – 2017-03-07 (×6): 1 mg via ORAL
  Filled 2017-03-01 (×2): qty 1
  Filled 2017-03-01: qty 7
  Filled 2017-03-01 (×5): qty 1

## 2017-03-01 NOTE — Progress Notes (Addendum)
[facility] MD Progress Note  03/01/2017 1:41 PM [name]  MRN:  [id number] Subjective:  Patient reports " I am OK". He reports tooth ache. Denies medication side effects.  Objective:   I have discussed case with staff and have met with patient. Patient is 21 year old male, has been diagnosed with Schizoaffective Disorder, and has history of cannabis dependence. As per chart admitted under commitment generated by mother for patient threatening to shoot her.  Patient presents with limited insight ,  states " I don't really know why I am here, I was walking home and they brought me here ". Denies HI towards mother.  Patient presents guarded, somewhat suspicious but polite, and behavior calm and in good control. He presents with a somewhat bizarre posture, waring blankets over himself and persistently holding a hand in front of his mouth. No EPS, no waxy flexibility, no catatonic symptoms noted. When asked, he states that he wants to protect his tooth.  He allowed me to inspect mouth, face- no facial swelling noted, broken molar ( lower ) . Patient states that Orajel provides temporary relief .  Patient tends to keep to self, but has gone to some groups, and is visible in day room at times . Denies medication side effects.      Principal Problem: Schizoaffective disorder, bipolar type ([place]) Diagnosis:   Patient Active Problem List   Diagnosis Date Noted  . Schizoaffective disorder, bipolar type ([place]) [F25.0] 02/26/2017  . 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 Cannabis use disorder, severe, dependence ([place]) [F12.20] 10/21/2014   Total Time spent with patient: 20 minutes  Past Psychiatric History: see HPI  Past Medical History:  Past Medical History:  Diagnosis Date  . ADHD (attention deficit hyperactivity disorder)    History reviewed. No pertinent surgical history. Family History:  Family History   Problem Relation Age of Onset  . Bipolar disorder Mother   . Diabetes Mother   . Hypertension Mother    Family Psychiatric  History: see HPI Social History:  History  Alcohol Use No     History  Drug Use  . Types: Marijuana    Social History   Social History  . Marital status: Single    Spouse name: N/A  . Number of children: N/A  . Years of education: N/A   Social History Main Topics  . Smoking status: Current Every Day Smoker    Packs/day: 0.25    Types: Cigarettes  . Smokeless tobacco: Never Used  . Alcohol use No  . Drug use: Yes    Types: Marijuana  . Sexual activity: Yes    Birth control/ protection: Condom   Other Topics Concern  . None   Social History Narrative  . None   Additional Social History:   Sleep: Fair  Appetite:  Fair  Current Medications: Current Facility-Administered Medications  Medication Dose Route Frequency Provider Last Rate Last Dose  . acetaminophen (TYLENOL) tablet 650 mg  650 mg Oral Q6H PRN Patrecia Pour, NP   650 mg at 03/01/17 1027  . alum & mag hydroxide-simeth (MAALOX/MYLANTA) 200-200-20 MG/5ML suspension 30 mL  30 mL Oral Q4H PRN Patrecia Pour, NP      . benzocaine (ORAJEL) 10 % mucosal gel   Mouth/Throat QID PRN Patriciaann Clan E, PA-C      . benztropine (COGENTIN) tablet 1 mg  1 mg Oral QHS Patrecia Pour, NP   1  mg at 02/28/17 2202  . cephALEXin (KEFLEX) capsule 500 mg  500 mg Oral Q8H Simon, Spencer E, PA-C   500 mg at 03/01/17 1302  . cloNIDine HCl (KAPVAY) ER tablet 0.1 mg  0.1 mg Oral QHS Eappen, Saramma, MD   0.1 mg at 02/28/17 2202  . hydrOXYzine (ATARAX/VISTARIL) tablet 25 mg  25 mg Oral TID PRN Patrecia Pour, NP   25 mg at 03/01/17 0203  . ibuprofen (ADVIL,MOTRIN) tablet 800 mg  800 mg Oral Q6H PRN Kerrie Buffalo, NP   800 mg at 03/01/17 0203  . lamoTRIgine (LAMICTAL) tablet 50 mg  50 mg Oral Daily Patrecia Pour, NP   50 mg at 03/01/17 0829  . magnesium hydroxide (MILK OF MAGNESIA) suspension 30 mL  30  mL Oral Daily PRN Patrecia Pour, NP      . OLANZapine (ZYPREXA) tablet 5 mg  5 mg Oral TID PRN Ursula Alert, MD       Or  . OLANZapine (ZYPREXA) injection 5 mg  5 mg Intramuscular TID PRN Eappen, Ria Clock, MD      . risperiDONE (RISPERDAL) tablet 3 mg  3 mg Oral QHS Patrecia Pour, NP   3 mg at 02/28/17 2202  . traZODone (DESYREL) tablet 50 mg  50 mg Oral QHS PRN Ursula Alert, MD   50 mg at 02/28/17 2204    Lab Results:  No results found for this or any previous visit (from the past 48 hour(s)).  Blood Alcohol level:  Lab Results  Component Value Date   ETH <5 02/23/2017   ETH <5 24/06/7352    Metabolic Disorder Labs: Lab Results  Component Value Date   HGBA1C 5.2 02/27/2017   MPG 103 02/27/2017   No results found for: PROLACTIN Lab Results  Component Value Date   CHOL 176 02/27/2017   TRIG 61 02/27/2017   HDL 55 02/27/2017   CHOLHDL 3.2 02/27/2017   VLDL 12 02/27/2017   LDLCALC 109 (H) 02/27/2017    Physical Findings: AIMS: Facial and Oral Movements Muscles of Facial Expression: None, normal Lips and Perioral Area: None, normal Jaw: None, normal Tongue: None, normal,Extremity Movements Upper (arms, wrists, hands, fingers): None, normal Lower (legs, knees, ankles, toes): None, normal, Trunk Movements Neck, shoulders, hips: None, normal, Overall Severity Severity of abnormal movements (highest score from questions above): None, normal Incapacitation due to abnormal movements: None, normal Patient's awareness of abnormal movements (rate only patient's report): No Awareness, Dental Status Current problems with teeth and/or dentures?: No Does patient usually wear dentures?: No  CIWA:    COWS:     Musculoskeletal: Strength & Muscle Tone: within normal limits Gait & Station: normal Patient leans: N/A  Psychiatric Specialty Exam: Physical Exam  Nursing note and vitals reviewed.   ROS tooth ache, no fever, no chills   Blood pressure (!) 142/83, pulse 83,  temperature 98.7 F (37.1 C), temperature source Oral, resp. rate 18, height 5' 11.25" (1.81 m), weight 100.2 kg (221 lb).Body mass index is 30.61 kg/m.  General Appearance: Fairly Groomed  Eye Contact:  Fair  Speech:  Slow  Volume:  Decreased  Mood:  denies depression  Affect:  appears restricted, flat  Thought Process:  Linear and Descriptions of Associations: Circumstantial  Orientation:  Other:  fully alert and attentive   Thought Content:  denies hallucinations, does not appear internally preoccupied   Suicidal Thoughts:  No denies any suicidal or self injurious ideations, denies any homicidal ideations, denies homicidal ideations towards  mother  Homicidal Thoughts:  No  Memory: recent and remote fair   Judgement:  Fair  Insight:  Fair  Psychomotor Activity:  Normal  Concentration:  Concentration: Fair and Attention Span: Fair  Recall:  [employer] of Knowledge:  Fair  Language:  Fair  Akathisia:  Negative  Handed:  Right  AIMS (if indicated):     Assets:  Desire for Improvement Physical Health Resilience  ADL's:   Fair   Cognition:  WNL  Sleep:  Number of Hours: 1.75    Assessment - 21 year old, has been diagnosed with Schizoaffective Disorder and Cannabis Dependence, admitted under commitment due to reported homicidal threats to mother. Patient presents guarded, providing little information beyond short answers or monosyllables, but is polite, cooperative. Denies HI.   Treatment Plan Summary: Continue to encourage group milieu and participation , to work on coping skills and symptom reduction Increase Risperidone to 1 mgr QAM and 3 mgrs QHS for psychosis Continue Lamictal 50 mgrs QDAY for mood disorder  Continue Trazodone 50 mgrs QHS PRN for insomnia as needed  Continue Cogentin 1 mgr QHS to minimize risk of EPS' Continue Vistaril 25 mgrs Q 8 hours PRN for anxiety as needed  Continue Ibuprofen PRNs, Keflex, and Orajel for toothache. I will also order CBC in AM , and an  EKG to monitor QTc .  Jenne Campus, MD  03/01/2017, 1:41 PM   Patient ID: Leonie Man, male   DOB: [birth year], 21 y.o.   MRN: [id number]

## 2017-03-01 NOTE — Progress Notes (Signed)
Patient ID: Zachary LeavensJohn C Cuthbertson, male   DOB: [birth year], 21 y.o.   MRN: [id number]    EKG completed and placed on chart. Dr. Jama Flavorsobos made aware. Jacquelyne BalintShalita Landrey Mahurin RN

## 2017-03-01 NOTE — Progress Notes (Signed)
Patient has been observed up in the dayroom watching tv with his blanket covering his mouth.He has complained of toothache pain and received orajel and motrin with little relief. He was compliant with medications scheduled. Safety maintained on unit with 15 min checks.

## 2017-03-01 NOTE — Progress Notes (Signed)
Writer has observed patient isolative to his room mostly tonight and came to dayroom briefly before requesting his medications and returning to his room. He continues to complain of tooth pain and has received Orajel along with tylenol for pain. Patient decided tonight not to take his Risperdal, Clinical research associatewriter educated him on the medication and informed him that he had taken it last night but he decided that he did not want to take it tonight. Writer encouraged him to talk with his doctor on tomorrow concerning this medication.

## 2017-03-01 NOTE — [facility] Group Notes (Signed)
[facility] Group Notes: (Clinical Social Work)   03/01/2017      Type of Therapy:  Group Therapy   Participation Level:  Did Not Attend despite MHT prompting   Ambrose MantleMareida Grossman-Orr, LCSW 03/01/2017, 1:27 PM

## 2017-03-01 NOTE — Progress Notes (Signed)
Patient ID: Zachary LeavensJohn C [name], male   DOB: Apr 14, 1996, 21 y.o.   MRN: [id number]   D: Pt has been flat and depressed on the unit today, he has not talked to staff or peers. Pt would occasionally walk up to the medications door and point to his mouth. Pt has been given prn medication was tooth pain all day. Pt reported that his depression was a 0, his hopelessness was a 2, and his anxiety was a 0. Pt reported that his goal for today was to go to group and [facility]. Pt reported being negative SI/HI, no AH/VH noted. A: 15 min checks continued for patient safety. R: Pt safety maintained.

## 2017-03-02 DIAGNOSIS — F12159 Cannabis abuse with psychotic disorder, unspecified: Secondary | ICD-10-CM

## 2017-03-02 LAB — CBC WITH DIFFERENTIAL/PLATELET
Basophils Absolute: 0 10*3/uL (ref 0.0–0.1)
Basophils Relative: 0 %
Eosinophils Absolute: 0.1 10*3/uL (ref 0.0–0.7)
Eosinophils Relative: 1 %
HCT: 44.8 % (ref 39.0–52.0)
Hemoglobin: 15.3 g/dL (ref 13.0–17.0)
Lymphocytes Relative: 28 %
Lymphs Abs: 2.1 10*3/uL (ref 0.7–4.0)
MCH: 31.4 pg (ref 26.0–34.0)
MCHC: 34.2 g/dL (ref 30.0–36.0)
MCV: 92 fL (ref 78.0–100.0)
Monocytes Absolute: 0.5 10*3/uL (ref 0.1–1.0)
Monocytes Relative: 7 %
Neutro Abs: 4.6 10*3/uL (ref 1.7–7.7)
Neutrophils Relative %: 64 %
Platelets: 222 10*3/uL (ref 150–400)
RBC: 4.87 MIL/uL (ref 4.22–5.81)
RDW: 13.4 % (ref 11.5–15.5)
WBC: 7.3 10*3/uL (ref 4.0–10.5)

## 2017-03-02 NOTE — Progress Notes (Signed)
D: Pt presents with flat affect and depressed mood. Pt appears withdrawn AEB isolating in his room and minimal interaction on the unit. Pt reported poor sleep last night but stated that it's due to working third shift. Pt reports feeling hyper and stated that he needs to be on his Adderall. Pt denies AVH.  Pt c/o ongoing toothache. Pt given tylenol and Orajel  for pain. Pt verbalized relief.  A: Medications reviewed with pt. Medications administered as ordered per MD. Verbal support provided. Pt encouraged to attend groups. 15 minute checks performed for safety. R: Pt compliant with tx.

## 2017-03-02 NOTE — Plan of Care (Signed)
Problem: Health Behavior/Discharge Planning: Goal: Compliance with prescribed medication regimen will improve Outcome: Progressing Pt compliant with taking scheduled meds

## 2017-03-02 NOTE — [facility] Group Notes (Signed)
[facility] Group Notes: (Clinical Social Work)   03/02/2017      Type of Therapy:  Group Therapy   Participation Level:  Did Not Attend despite MHT prompting   Zachary MantleMareida Grossman-Orr, LCSW 03/02/2017, 12:38 PM

## 2017-03-02 NOTE — Progress Notes (Signed)
[facility]  03/02/2017 12:59 PM Zachary LeavensJohn C [name]  MRN:  [id number] Subjective:  Patient reports "I'm feeling a lot better. More clear, good sleep. My tooth hurts but its improving."  Objective: Pt seen and chart reviewed. Pt is alert/oriented x4, calm, cooperative, and appropriate to situation. Pt denies suicidal/homicidal ideation and psychosis and does not appear to be responding to internal stimuli. Pt reports that he had a very hard time sleeping and that he is more lucid after he has been able to rest. He states that he has been chewing primarily on his left side due to pain from his broken lower right molar (I examined this and it appears to be healing from an infection standpoint but he will need a dentist ASAP upon discharge; he reports an appointment on 03/04/17).    Principal Problem: Schizoaffective disorder, bipolar type (HCC) Diagnosis:   Patient Active Problem List   Diagnosis Date Noted  . Schizoaffective disorder, bipolar type (HCC) [F25.0] 02/26/2017    Priority: High  . Cannabis use disorder, severe, dependence (HCC) [F12.20] 10/21/2014    Priority: High  . 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 ADHD (attention deficit hyperactivity disorder)    History reviewed. No pertinent surgical history. Family History:  Family History  Problem Relation Age of Onset  . Bipolar disorder Mother   . Diabetes Mother   . Hypertension Mother    Family Psychiatric  History: see HPI Social History:  History  Alcohol Use No     History  Drug Use  . Types: Marijuana    Social History   Social History  . Marital status: Single    Spouse name: N/A  . Number of children: N/A  . Years of education: N/A   Social History Main Topics  . Smoking  status: Current Every Day Smoker    Packs/day: 0.25    Types: Cigarettes  . Smokeless tobacco: Never Used  . Alcohol use No  . Drug use: Yes    Types: Marijuana  . Sexual activity: Yes    Birth control/ protection: Condom   Other Topics Concern  . None   Social History Narrative  . None   Additional Social History:   Sleep: Fair  Appetite:  Fair  Current Medications: Current Facility-Administered Medications  Medication Dose Route Frequency Provider Last Rate Last Dose  . acetaminophen (TYLENOL) tablet 650 mg  650 mg Oral Q6H PRN Charm RingsLord, Jamison Y, NP   650 mg at 03/02/17 0825  . alum & mag hydroxide-simeth (MAALOX/MYLANTA) 200-200-20 MG/5ML suspension 30 mL  30 mL Oral Q4H PRN Charm RingsLord, Jamison Y, NP      . benzocaine (ORAJEL) 10 % mucosal gel   Mouth/Throat QID PRN Kerry HoughSimon, Spencer E, PA-C      . benztropine (COGENTIN) tablet 1 mg  1 mg Oral QHS Charm RingsLord, Jamison Y, NP   1 mg at 03/01/17 2109  . cephALEXin (KEFLEX) capsule 500 mg  500 mg Oral Q8H Simon, Spencer E, PA-C   500 mg at 03/02/17 [id number]  . cloNIDine HCl (KAPVAY) ER tablet 0.1 mg  0.1 mg Oral QHS Eappen, Saramma, MD   0.1 mg at 03/01/17 2109  . hydrOXYzine (ATARAX/VISTARIL) tablet 25 mg  25 mg Oral TID PRN Charm RingsLord, Jamison Y, NP   25 mg  at 03/02/17 0014  . ibuprofen (ADVIL,MOTRIN) tablet 800 mg  800 mg Oral Q6H PRN Adonis Brook, NP   800 mg at 03/02/17 0014  . lamoTRIgine (LAMICTAL) tablet 50 mg  50 mg Oral Daily [name], NP   50 mg at 03/02/17 0825  . magnesium hydroxide (MILK OF MAGNESIA) suspension 30 mL  30 mL Oral Daily PRN [name], NP      . OLANZapine (ZYPREXA) tablet 5 mg  5 mg Oral TID PRN [name], MD       Or  . OLANZapine (ZYPREXA) injection 5 mg  5 mg Intramuscular TID PRN Eappen, Levin Bacon, MD      . risperiDONE (RISPERDAL) tablet 1 mg  1 mg Oral BH-q7a Cobos, Rockey Situ, MD   1 mg at 03/02/17 0824  . risperiDONE (RISPERDAL) tablet 3 mg  3 mg Oral QHS [name], NP   3 mg at 02/28/17 2202   . traZODone (DESYREL) tablet 50 mg  50 mg Oral QHS PRN [name], MD   50 mg at 03/01/17 2109    Lab Results:  Results for orders placed or performed during the hospital encounter of 02/25/17 (from the past 48 hour(s))  CBC with Differential/Platelet     Status: None   Collection Time: 03/02/17  6:50 AM  Result Value Ref Range   WBC 7.3 4.0 - 10.5 K/uL   RBC 4.87 4.22 - 5.81 MIL/uL   Hemoglobin 15.3 13.0 - 17.0 g/dL   HCT 16.1 09.6 - 04.5 %   MCV 92.0 78.0 - 100.0 fL   MCH 31.4 26.0 - 34.0 pg   MCHC 34.2 30.0 - 36.0 g/dL   RDW 40.9 81.1 - 91.4 %   Platelets 222 150 - 400 K/uL   Neutrophils Relative % 64 %   Neutro Abs 4.6 1.7 - 7.7 K/uL   Lymphocytes Relative 28 %   Lymphs Abs 2.1 0.7 - 4.0 K/uL   Monocytes Relative 7 %   Monocytes Absolute 0.5 0.1 - 1.0 K/uL   Eosinophils Relative 1 %   Eosinophils Absolute 0.1 0.0 - 0.7 K/uL   Basophils Relative 0 %   Basophils Absolute 0.0 0.0 - 0.1 K/uL    Comment: Performed at [facility], [address], [place], [place] [address]    Blood Alcohol level:  Lab Results  Component Value Date   [facility] <5 02/23/2017   ETH <5 06/03/2015    Metabolic Disorder Labs: Lab Results  Component Value Date   HGBA1C 5.2 02/27/2017   MPG 103 02/27/2017   No results found for: PROLACTIN Lab Results  Component Value Date   CHOL 176 02/27/2017   TRIG 61 02/27/2017   HDL 55 02/27/2017   CHOLHDL 3.2 02/27/2017   VLDL 12 02/27/2017   LDLCALC 109 (H) 02/27/2017    Physical Findings: AIMS: Facial and Oral Movements Muscles of Facial Expression: None, normal Lips and Perioral Area: None, normal Jaw: None, normal Tongue: None, normal,Extremity Movements Upper (arms, wrists, hands, fingers): None, normal Lower (legs, knees, ankles, toes): None, normal, Trunk Movements Neck, shoulders, hips: None, normal, Overall Severity Severity of abnormal movements (highest score from questions above): None, normal Incapacitation  due to abnormal movements: None, normal Patient's awareness of abnormal movements (rate only patient's report): No Awareness, Dental Status Current problems with teeth and/or dentures?: No Does patient usually wear dentures?: No  CIWA:    COWS:     Musculoskeletal: Strength & Muscle Tone: within normal  limits Gait & Station: normal Patient leans: N/A  Psychiatric Specialty Exam: Physical Exam  Nursing note and vitals reviewed.   Review of Systems  Psychiatric/Behavioral: Positive for depression and substance abuse. Negative for suicidal ideas. The patient is nervous/anxious and has insomnia.   All other systems reviewed and are negative.  tooth ache, no fever, no chills   Blood pressure 136/87, pulse 83, temperature 97.7 F (36.5 C), temperature source Oral, resp. rate 18, height 5' 11.25" (1.81 m), weight 100.2 kg (221 lb).Body mass index is 30.61 kg/m.  General Appearance: Casual and Fairly Groomed  Eye Contact:  Good  Speech:  Clear and Coherent and Slow  Volume:  Normal  Mood:  denies depression  Affect:  appears restricted, flat  Thought Process:  Linear and Descriptions of Associations: Circumstantial  Orientation:  Other:  fully alert and attentive   Thought Content:  denies hallucinations, does not appear internally preoccupied   Suicidal Thoughts:  No   Homicidal Thoughts:  No  Memory: recent and remote fair   Judgement:  Fair  Insight:  Fair  Psychomotor Activity:  Normal  Concentration:  Concentration: Fair and Attention Span: Fair  Recall:  [employer] of Knowledge:  Fair  Language:  Fair  Akathisia:  Negative  Handed:  Right  AIMS (if indicated):     Assets:  Desire for Improvement Physical Health Resilience  ADL's:   Fair   Cognition:  WNL  Sleep:  Number of Hours: 0.75    Treatment Plan Summary: Schizoaffective disorder, bipolar type (HCC) unstable, yet improving, treated as below  On 03/02/2017 , I have reviewed medications below and concur with  regimen with the following changes:   Continue to encourage group milieu and participation , to work on coping skills and symptom reduction Increase Risperidone to 1 mgr QAM and 3 mgrs QHS for psychosis Continue Lamictal 50 mgrs QDAY for mood disorder  Continue Trazodone 50 mgrs QHS PRN for insomnia as needed  Continue Cogentin 1 mgr QHS to minimize risk of EPS' Continue Vistaril 25 mgrs Q 8 hours PRN for anxiety as needed  Continue Ibuprofen PRNs, Keflex, and Orajel for toothache. -CBC resulted today 03/02/17 and is WNL  Londen, Bok, FNP  03/02/2017, 12:59 PM

## 2017-03-03 MED ORDER — TRAZODONE HCL 100 MG PO TABS
100.0000 mg | ORAL_TABLET | Freq: Every day | ORAL | Status: DC
  Administered 2017-03-03 – 2017-03-05 (×3): 100 mg via ORAL
  Filled 2017-03-03 (×6): qty 1

## 2017-03-03 MED ORDER — RISPERIDONE 1 MG PO TABS
3.5000 mg | ORAL_TABLET | Freq: Every day | ORAL | Status: DC
  Administered 2017-03-03: 3.5 mg via ORAL
  Filled 2017-03-03 (×2): qty 3.5

## 2017-03-03 NOTE — Progress Notes (Signed)
Patient ID: [name], male   DOB: 07/30/1996, Zachary Leavens[age] y.o.   MRN: [id number]  Pt currently presents with a flat affect and guarded behavior. Pt forwards little to Clinical research associatewriter. Main complaint is tooth pain 8/10 that is not being helped by current treatment. Pt reports good sleep with current medication regimen. Pt remains in his room for most of the night tonight, interacts minimally with staff and peers. Pt seen holding his hand over his mouth in the hallway, appears to be trying to hide the fact that he is laughing at internal stimuli.   Pt provided with medications per providers orders. Pt's labs and vitals were monitored throughout the night. Pt given a 1:1 about emotional and mental status. Pt supported and encouraged to express concerns and questions. Pt educated on medications.  Pt's safety ensured with 15 minute and environmental checks. Pt currently denies SI/HI and A/V hallucinations. Pt verbally agrees to seek staff if SI/HI or A/VH occurs and to consult with staff before acting on any harmful thoughts. Will continue POC.

## 2017-03-03 NOTE — Progress Notes (Signed)
Patient has been mostly isolative to his room other than attending group, He has been observed pacing around in the room or in his bathroom with minimal sleep. He took his scheduled medications and attended group. He appears to be responding to internal stimuli but denies. Support given and safety maintained on unit with 15 min checks.

## 2017-03-03 NOTE — Progress Notes (Signed)
Psychoeducational Group Note  Date:  03/03/2017 Time:  2142  Group Topic/Focus:  Wrap-Up Group:   The focus of this group is to help patients review their daily goal of treatment and discuss progress on daily workbooks.  Participation Level: Did Not Attend  Participation Quality:  Not Applicable  Affect:  Not Applicable  Cognitive:  Not Applicable  Insight:  Not Applicable  Engagement in Group: Not Applicable  Additional Comments:  The patient did not attend group this evening.   Hazle CocaGOODMAN, Annison Birchard S 03/03/2017, 9:42 PM

## 2017-03-03 NOTE — Progress Notes (Signed)
Recreation Therapy Notes  Date: 03/03/17 Time: 1000 Location: 500 Hall Dayroom  Group Topic: Coping Skills  Goal Area(s) Addresses:  Patients will be able to identify coping skills. Patients will be able to identify the importance of positive coping skills. Patients will be able to identify importance of using coping skills post d/c.  Intervention: Mind map, pencils  Activity: Mind map.  Patients were given a blank mind map.  LRT and patients filled in the first eight boxes with potential triggers together.  Patients were to then come up with three coping skills for each of the eight triggers identified by patients and LRT.  Education: PharmacologistCoping Skills, Building control surveyorDischarge Planning.   Education Outcome: Acknowledges understanding/In group clarification offered/Needs additional education.   Clinical Observations/Feedback: Pt did not attend group.   Caroll RancherMarjette Tayquan Gassman, LRT/CTRS         Caroll RancherLindsay, Wayland Baik A 03/03/2017 12:44 PM

## 2017-03-03 NOTE — [facility] Group Notes (Signed)
[facility] LCSW Group Therapy  03/03/2017 1:15 pm  Type of Therapy: Process Group Therapy  Participation Level:  Active  Participation Quality:  Appropriate  Affect:  Flat  Cognitive:  Oriented  Insight:  Improving  Engagement in Group:  Limited  Engagement in Therapy:  Limited  Modes of Intervention:  Activity, Clarification, Education, Problem-solving and Support  Summary of Progress/Problems: Today's group addressed the issue of overcoming obstacles.  Patients were asked to identify their biggest obstacle post d/c that stands in the way of their on-going success, and then problem solve as to how to manage this. Stayed the entire time, engaged throughout.  Minimal interaction.  Initially declined to say anything-later was willing to state that he appreciates that his family is in his corner to help him with obstacles.  Ida Rogueorth, Dorella Laster B 03/03/2017   3:53 PM

## 2017-03-03 NOTE — Progress Notes (Signed)
[facility]  03/03/2017 12:51 PM Zachary LeavensJohn C [name]  MRN:  [id number] Subjective:  Patient states " I am ok."   Objective: Patient seen and chart reviewed.Discussed patient with treatment team.  Pt seen with poor eye contact, his hair braid continues to limit visibility of his eyes and face since it falls over it. Pt continues to appear guarded , limited interaction in milieu. Pt preoccupied with discharge . He reports poor sleep due to tooth ache last night. Discussed pain management as well as follow up on discharge with dentist.      Principal Problem: Schizoaffective disorder, bipolar type (HCC) Diagnosis:   Patient Active Problem List   Diagnosis Date Noted  . Schizoaffective disorder, bipolar type (HCC) [F25.0] 02/26/2017  . 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 Cannabis use disorder, severe, dependence (HCC) [F12.20] 10/21/2014   Total Time spent with patient: 25 minutes  Past Psychiatric History: see HPI  Past Medical History:  Past Medical History:  Diagnosis Date  . ADHD (attention deficit hyperactivity disorder)    History reviewed. No pertinent surgical history. Family History:  Family History  Problem Relation Age of Onset  . Bipolar disorder Mother   . Diabetes Mother   . Hypertension Mother    Family Psychiatric  History: see HPI Social History:  History  Alcohol Use No     History  Drug Use  . Types: Marijuana    Social History   Social History  . Marital status: Single    Spouse name: N/A  . Number of children: N/A  . Years of education: N/A   Social History Main Topics  . Smoking status: Current Every Day Smoker    Packs/day: 0.25    Types: Cigarettes  . Smokeless tobacco: Never Used  . Alcohol use No  . Drug use: Yes    Types: Marijuana  . Sexual activity: Yes    Birth control/ protection: Condom   Other Topics Concern  . None   Social  History Narrative  . None   Additional Social History:   Sleep: Poor  Appetite:  Fair  Current Medications: Current Facility-Administered Medications  Medication Dose Route Frequency Provider Last Rate Last Dose  . alum & mag hydroxide-simeth (MAALOX/MYLANTA) 200-200-20 MG/5ML suspension 30 mL  30 mL Oral Q4H PRN Charm RingsLord, Jamison Y, NP      . benzocaine (ORAJEL) 10 % mucosal gel   Mouth/Throat QID PRN Kerry HoughSimon, Spencer E, PA-C   1 application at 03/02/17 2156  . benztropine (COGENTIN) tablet 1 mg  1 mg Oral QHS Charm RingsLord, Jamison Y, NP   1 mg at 03/02/17 2155  . cephALEXin (KEFLEX) capsule 500 mg  500 mg Oral Q8H Simon, Spencer E, PA-C   500 mg at 03/03/17 [id number]  . cloNIDine HCl (KAPVAY) ER tablet 0.1 mg  0.1 mg Oral QHS Jean Alejos, MD   0.1 mg at 03/02/17 2155  . hydrOXYzine (ATARAX/VISTARIL) tablet 25 mg  25 mg Oral TID PRN Charm RingsLord, Jamison Y, NP   25 mg at 03/02/17 0014  . ibuprofen (ADVIL,MOTRIN) tablet 800 mg  800 mg Oral Q6H PRN Adonis BrookAgustin, Sheila, NP   800 mg at 03/02/17 2157  . lamoTRIgine (LAMICTAL) tablet 50 mg  50 mg Oral Daily Charm RingsLord, Jamison Y, NP   50 mg at 03/03/17 [id number]  . magnesium hydroxide (MILK OF MAGNESIA) suspension 30 mL  30 mL Oral Daily PRN Charm RingsLord, Jamison Y, NP      .  OLANZapine (ZYPREXA) tablet 5 mg  5 mg Oral TID PRN [name], MD       Or  . OLANZapine (ZYPREXA) injection 5 mg  5 mg Intramuscular TID PRN Galo Sayed, Levin Bacon, MD      . risperiDONE (RISPERDAL) tablet 1 mg  1 mg Oral BH-q7a Cobos, Rockey Situ, MD   1 mg at 03/03/17 1610  . risperiDONE (RISPERDAL) tablet 3.5 mg  3.5 mg Oral QHS Makenzi Bannister, MD      . traZODone (DESYREL) tablet 100 mg  100 mg Oral QHS [name], MD        Lab Results:  Results for orders placed or performed during the hospital encounter of 02/25/17 (from the past 48 hour(s))  CBC with Differential/Platelet     Status: None   Collection Time: 03/02/17  6:50 AM  Result Value Ref Range   WBC 7.3 4.0 - 10.5 K/uL   RBC 4.87 4.22 - 5.81  MIL/uL   Hemoglobin 15.3 13.0 - 17.0 g/dL   HCT 96.0 45.4 - 09.8 %   MCV 92.0 78.0 - 100.0 fL   MCH 31.4 26.0 - 34.0 pg   MCHC 34.2 30.0 - 36.0 g/dL   RDW 11.9 14.7 - 82.9 %   Platelets 222 150 - 400 K/uL   Neutrophils Relative % 64 %   Neutro Abs 4.6 1.7 - 7.7 K/uL   Lymphocytes Relative 28 %   Lymphs Abs 2.1 0.7 - 4.0 K/uL   Monocytes Relative 7 %   Monocytes Absolute 0.5 0.1 - 1.0 K/uL   Eosinophils Relative 1 %   Eosinophils Absolute 0.1 0.0 - 0.7 K/uL   Basophils Relative 0 %   Basophils Absolute 0.0 0.0 - 0.1 K/uL    Comment: Performed at [facility], [address] [place], [place] [address]    Blood Alcohol level:  Lab Results  Component Value Date   [facility] <5 02/23/2017   ETH <5 06/03/2015    Metabolic Disorder Labs: Lab Results  Component Value Date   HGBA1C 5.2 02/27/2017   MPG 103 02/27/2017   No results found for: PROLACTIN Lab Results  Component Value Date   CHOL 176 02/27/2017   TRIG 61 02/27/2017   HDL 55 02/27/2017   CHOLHDL 3.2 02/27/2017   VLDL 12 02/27/2017   LDLCALC 109 (H) 02/27/2017    Physical Findings: AIMS: Facial and Oral Movements Muscles of Facial Expression: None, normal Lips and Perioral Area: None, normal Jaw: None, normal Tongue: None, normal,Extremity Movements Upper (arms, wrists, hands, fingers): None, normal Lower (legs, knees, ankles, toes): None, normal, Trunk Movements Neck, shoulders, hips: None, normal, Overall Severity Severity of abnormal movements (highest score from questions above): None, normal Incapacitation due to abnormal movements: None, normal Patient's awareness of abnormal movements (rate only patient's report): No Awareness, Dental Status Current problems with teeth and/or dentures?: No Does patient usually wear dentures?: No  CIWA:    COWS:     Musculoskeletal: Strength & Muscle Tone: within normal limits Gait & Station: normal Patient leans: N/A  Psychiatric Specialty  Exam: Physical Exam  Nursing note and vitals reviewed.   Review of Systems  Psychiatric/Behavioral: Positive for depression and substance abuse. The patient is nervous/anxious and has insomnia.   All other systems reviewed and are negative.  tooth ache positive  Blood pressure 139/82, pulse 98, temperature 97.5 F (36.4 C), temperature source Oral, resp. rate 20, height 5' 11.25" (1.81 m), weight 100.2 kg (221 lb).Body mass index is  30.61 kg/m.  General Appearance: braids over his face , walks around like that  Eye Contact:  Minimal  Speech:  Slow  Volume:  Normal  Mood:  Dysphoric  Affect:  Constricted  Thought Process:  Linear and Descriptions of Associations: Circumstantial  Orientation:  Other:  self, situation  Thought Content:  Paranoid Ideation  Suicidal Thoughts:  No   Homicidal Thoughts:  No  Memory: recent, remote, immediate- fair  Judgement:  Impaired  Insight:  Lacking  Psychomotor Activity:  Normal  Concentration:  Concentration: Fair and Attention Span: Fair  Recall:  [employer] of Knowledge:  Fair  Language:  Fair  Akathisia:  Negative  Handed:  Right  AIMS (if indicated):     Assets:  Desire for Improvement Physical Health Resilience  ADL's:   Fair   Cognition:  WNL  Sleep:  Number of Hours: 2.5     Schizoaffective disorder, bipolar type (HCC) unstable - improving  Will continue today 03/03/17  plan as below except where it is noted.  Treatment plan:Pt with hx of mood lability, paranoia, aggression, cannabis abuse , presented IVCed for making threats to hurt mother, however pt has been making partial progress, although he continues to struggle with sleep and limited participation in milieu. Pt reports that his tooth ache keeps him awake. Will continue to readjust medications and treat. Mother left a message for writer stating she wants to take her son back home when he is ready.  Plan: Increase Risperidone to 3.5 mg po qhs and 1 mg po daily for  psychosis. Will continue Lamictal 50 mg po daily for mood lability. Increase Trazodone to 100 mg po qhs for insomnia. Cogentin 1 mg po qhs for EPS. Clonidine 0.1 mg po qhs for impulsivity. Continue PRN pain management for tooth ache. Keflex 500 mg po q8h for tooth ache. CSW will continue to work on disposition.  Zachary Bittick, MD  03/03/2017, 12:51 PM

## 2017-03-03 NOTE — Progress Notes (Signed)
[facility] Group Notes:  (Nursing/MHT/Case Management/Adjunct)  Date:  03/03/2017  Time:  1:52 AM  Type of Therapy:  Psychoeducational Skills  Participation Level:  Minimal  Participation Quality:  Resistant  Affect:  Flat  Cognitive:  Lacking  Insight:  Appropriate  Engagement in Group:  Limited  Modes of Intervention:  Education  Summary of Progress/Problems: The patient shared in group that he went outdoors and played basketball. No additional details about his day were provided. His goal for tomorrow is to work on "coping skills" and to attend groups.   Tripp Goins S 03/03/2017, 1:52 AM

## 2017-03-03 NOTE — Plan of Care (Signed)
Problem: Coping: Goal: Ability to verbalize frustrations and anger appropriately will improve Outcome: Not Progressing Pt forwards little to staff tonight, did not attend group

## 2017-03-03 NOTE — Progress Notes (Signed)
D: Pt presents with flat affect. Pt appears to be withdrawn and isolative AEB having  minimal interaction on the milieu and staying in his room. Pt noted to be anxious this afternoon and observed pacing in the hallway. Pt denies AVH. Pt denies SI.  A: Medications reviewed with pt. Medications administered as ordered per MD. Verbal support provided. Pt encouraged to attend groups. 15 minute checks performed for safety.  R: Pt compliant with tx.

## 2017-03-04 MED ORDER — LAMOTRIGINE 25 MG PO TABS
75.0000 mg | ORAL_TABLET | Freq: Every day | ORAL | Status: DC
  Administered 2017-03-05 – 2017-03-07 (×3): 75 mg via ORAL
  Filled 2017-03-04 (×4): qty 3
  Filled 2017-03-04: qty 21

## 2017-03-04 MED ORDER — RISPERIDONE 2 MG PO TABS
4.0000 mg | ORAL_TABLET | Freq: Every day | ORAL | Status: DC
  Administered 2017-03-04 – 2017-03-06 (×3): 4 mg via ORAL
  Filled 2017-03-04 (×4): qty 2
  Filled 2017-03-04: qty 14

## 2017-03-04 MED ORDER — RISPERIDONE MICROSPHERES 25 MG IM SUSR
25.0000 mg | INTRAMUSCULAR | Status: DC
  Administered 2017-03-05: 25 mg via INTRAMUSCULAR
  Filled 2017-03-04: qty 2

## 2017-03-04 NOTE — Tx Team (Signed)
Interdisciplinary Treatment and Diagnostic Plan Update  03/04/2017 Time of Session: 10:52 AM  [name] MRN: [id number]  Principal Diagnosis: Schizoaffective disorder, bipolar type ([place])  Secondary Diagnoses: Principal Problem:   Schizoaffective disorder, bipolar type ([place]) Active Problems:   Cannabis use disorder, severe, dependence ([place])   Current Medications:  Current Facility-Administered Medications  Medication Dose Route Frequency Provider Last Rate Last Dose  . alum & mag hydroxide-simeth (MAALOX/MYLANTA) 200-200-20 MG/5ML suspension 30 mL  30 mL Oral Q4H PRN Patrecia Pour, NP      . benzocaine (ORAJEL) 10 % mucosal gel   Mouth/Throat QID PRN Laverle Hobby, PA-C   1 application at 95/62/13 2156  . benztropine (COGENTIN) tablet 1 mg  1 mg Oral QHS Patrecia Pour, NP   1 mg at 03/03/17 2126  . cephALEXin (KEFLEX) capsule 500 mg  500 mg Oral Q8H Simon, Spencer E, PA-C   500 mg at 03/04/17 0865  . cloNIDine HCl (KAPVAY) ER tablet 0.1 mg  0.1 mg Oral QHS Eappen, Saramma, MD   0.1 mg at 03/03/17 2126  . hydrOXYzine (ATARAX/VISTARIL) tablet 25 mg  25 mg Oral TID PRN Patrecia Pour, NP   25 mg at 03/02/17 0014  . ibuprofen (ADVIL,MOTRIN) tablet 800 mg  800 mg Oral Q6H PRN Kerrie Buffalo, NP   800 mg at 03/04/17 7846  . lamoTRIgine (LAMICTAL) tablet 50 mg  50 mg Oral Daily Patrecia Pour, NP   50 mg at 03/04/17 0900  . magnesium hydroxide (MILK OF MAGNESIA) suspension 30 mL  30 mL Oral Daily PRN Patrecia Pour, NP      . OLANZapine (ZYPREXA) tablet 5 mg  5 mg Oral TID PRN Ursula Alert, MD       Or  . OLANZapine (ZYPREXA) injection 5 mg  5 mg Intramuscular TID PRN Eappen, Ria Clock, MD      . risperiDONE (RISPERDAL) tablet 1 mg  1 mg Oral BH-q7a Cobos, Myer Peer, MD   1 mg at 03/04/17 9629  . risperiDONE (RISPERDAL) tablet 3.5 mg  3.5 mg Oral QHS Eappen, Saramma, MD   3.5 mg at 03/03/17 2126  . traZODone (DESYREL) tablet 100 mg  100 mg Oral QHS Ursula Alert, MD   100 mg  at 03/03/17 2126    PTA Medications: Prescriptions Prior to Admission  Medication Sig Dispense Refill Last Dose  . benztropine (COGENTIN) 1 MG tablet Take 1 tablet (1 mg total) by mouth at bedtime. (Patient not taking: Reported on 02/23/2017) 30 tablet 0 Not Taking at Unknown time  . hydrOXYzine (ATARAX/VISTARIL) 25 MG tablet Take 25 mg (one tablet) three times a day as needed for anxiety/agitation (Patient not taking: Reported on 02/23/2017) 45 tablet 0 Not Taking at Unknown time  . lamoTRIgine (LAMICTAL) 25 MG tablet Take 2 tablets (50 mg total) by mouth daily. (Patient not taking: Reported on 02/23/2017) 60 tablet 0 Not Taking at Unknown time  . naproxen sodium (ANAPROX) 220 MG tablet Take 220 mg by mouth 2 (two) times daily with a meal. Tooth pain   02/22/2017 at Unknown time  . PRESCRIPTION MEDICATION Inject 400 mg into the muscle every 30 (thirty) days. abilify ER 41m injection   APRIL  . risperiDONE (RISPERDAL) 3 MG tablet Take 1 tablet (3 mg total) by mouth at bedtime. (Patient not taking: Reported on 02/23/2017) 30 tablet 0 Not Taking at Unknown time  . traZODone (DESYREL) 50 MG tablet Take 1 tablet (50 mg total) by mouth at bedtime. (  Patient not taking: Reported on 02/23/2017) 30 tablet 0 Not Taking at Unknown time    Treatment Modalities: Medication Management, Group therapy, Case management,  1 to 1 session with clinician, Psychoeducation, Recreational therapy.   Physician Treatment Plan for Primary Diagnosis: Schizoaffective disorder, bipolar type ([place]) Long Term Goal(s): Improvement in symptoms so as ready for discharge  Short Term Goals: Ability to verbalize feelings will improve Compliance with prescribed medications will improve Ability to identify triggers associated with substance abuse/mental health issues will improve Ability to verbalize feelings will improve Compliance with prescribed medications will improve Ability to identify triggers associated with substance  abuse/mental health issues will improve  Medication Management: Evaluate patient's response, side effects, and tolerance of medication regimen.  Therapeutic Interventions: 1 to 1 sessions, Unit Group sessions and Medication administration.  Evaluation of Outcomes: Progressing   5/22:  Pt with hx of mood lability, paranoia , aggression and cannabis abuse , presented IVC ed for making threats to hurt mother , is making progress, tolerating medications well,  Plan: Will increase Risperidone to 4 mg po qhs and 1 mg po daily for psychosis. Will offer Risperdal consta IM prior to discharge. Will increase Lamictal to 75 mg po daily for mood sx. Continue Trazodone 100 mg po qhs for insomnia. Cogentin 1 mg po qhs for EPS. Clonidine 0.1 mg po qhs for impulsivity. Continue PRN pain management for tooth ache. Keflex 500 mg po q8h for tooth ache.  Physician Treatment Plan for Secondary Diagnosis: Principal Problem:   Schizoaffective disorder, bipolar type ([place]) Active Problems:   Cannabis use disorder, severe, dependence ([place])   Long Term Goal(s): Improvement in symptoms so as ready for discharge  Short Term Goals: Ability to verbalize feelings will improve Compliance with prescribed medications will improve Ability to identify triggers associated with substance abuse/mental health issues will improve Ability to verbalize feelings will improve Compliance with prescribed medications will improve Ability to identify triggers associated with substance abuse/mental health issues will improve  Medication Management: Evaluate patient's response, side effects, and tolerance of medication regimen.  Therapeutic Interventions: 1 to 1 sessions, Unit Group sessions and Medication administration.  Evaluation of Outcomes: Progressing   RN Treatment Plan for Primary Diagnosis: Schizoaffective disorder, bipolar type ([place]) Long Term Goal(s): Knowledge of disease and therapeutic regimen to maintain  health will improve  Short Term Goals: Ability to identify and develop effective coping behaviors will improve and Compliance with prescribed medications will improve  Medication Management: RN will administer medications as ordered by provider, will assess and evaluate patient's response and provide education to patient for prescribed medication. RN will report any adverse and/or side effects to prescribing provider.  Therapeutic Interventions: 1 on 1 counseling sessions, Psychoeducation, Medication administration, Evaluate responses to treatment, Monitor vital signs and CBGs as ordered, Perform/monitor CIWA, COWS, AIMS and Fall Risk screenings as ordered, Perform wound care treatments as ordered.  Evaluation of Outcomes: Progressing    Recreational Therapy Treatment Plan for Primary Diagnosis: Schizoaffective disorder, bipolar type ([place]) Long Term Goal(s): Patient will participate in recreation therapy treatment in at least 2 group sessions without prompting from LRT  Short Term Goals: Patient will demonstrate increased ability to follow instructions, as demonstrated by ability to follow LRT instructions on first prompt during recreation therapy group sessions  Treatment Modalities: Group and Pet Therapy  Therapeutic Interventions: Psychoeducation  Evaluation of Outcomes: Progressing   LCSW Treatment Plan for Primary Diagnosis: Schizoaffective disorder, bipolar type ([place]) Long Term Goal(s): Safe transition to appropriate next level  of care at discharge, Engage patient in therapeutic group addressing interpersonal concerns.  Short Term Goals: Engage patient in aftercare planning with referrals and resources  Therapeutic Interventions: Assess for all discharge needs, 1 to 1 time with Social worker, Explore available resources and support systems, Assess for adequacy in community support network, Educate family and significant other(s) on suicide prevention, Complete Psychosocial  Assessment, Interpersonal group therapy.  Evaluation of Outcomes: Met  Return home, follow up [facility]   Progress in Treatment: Attending groups: No Participating in groups: No Taking medication as prescribed: Yes Toleration medication: Yes, no side effects reported at this time Family/Significant other contact made: Yes Patient understands diagnosis: No  Limited insight Discussing patient identified problems/goals with staff: Yes Medical problems stabilized or resolved: Yes Denies suicidal/homicidal ideation: Yes Issues/concerns per patient self-inventory: None Other: N/A  New problem(s) identified: None identified at this time.   New Short Term/Long Term Goal(s): None identified at this time.   Discharge Plan or Barriers:   Reason for Continuation of Hospitalization:  Hallucinations Medication stabilization   Estimated Length of Stay: 2-4 days  Attendees: Patient: 03/04/2017  10:52 AM  Physician: Ursula Alert, MD 03/04/2017  10:52 AM  Nursing: Elesa Massed, RN 03/04/2017  10:52 AM  RN Care Manager: Lars Pinks, RN 03/04/2017  10:52 AM  Social Worker: Ripley Fraise 03/04/2017  10:52 AM  Recreational Therapist: Victorino Sparrow, LRT/CTRS 03/04/2017  10:52 AM  Other: Norberto Sorenson 03/04/2017  10:52 AM  Other:  03/04/2017  10:52 AM    Scribe for Treatment Team:  Roque Lias LCSW 03/04/2017 10:52 AM

## 2017-03-04 NOTE — Progress Notes (Signed)
Recreation Therapy Notes  Date: 03/04/17 Time: 1000 Location: [address] Dayroom  Group Topic: Leisure Education  Goal Area(s) Addresses:  Patient will identify positive leisure activities.  Patient will identify one positive benefit of participation in leisure activities.   Intervention: Holiday representativeConstruction paper, scissors, glue sticks  Activity: Leisure Collage.  Patients were picture themselves as the heads of their own community centers.  Patients were to identify pictures of activities they would offer at their community. Patients were to cut out the pictures and make a collage of the various activities they identified.  Education:  Leisure Education, Building control surveyorDischarge Planning  Education Outcome: Acknowledges education/In group clarification offered/Needs additional education  Clinical Observations/Feedback: Pt did not attend group.   Caroll RancherMarjette Laquesha Holcomb, LRT/CTRS         Caroll RancherLindsay, Arshiya Jakes A 03/04/2017 11:57 AM

## 2017-03-04 NOTE — Progress Notes (Signed)
Recreation Therapy Notes  Animal-Assisted Activity (AAA) Program Checklist/Progress Notes Patient Eligibility Criteria Checklist & Daily Group note for Rec Tx Intervention  Date: 05.22.2018 Time: 2:45pm [address]    AAA/T Program Assumption of Risk Form signed by Patient/ or Parent Legal Guardian Yes  Patient is free of allergies or sever asthma Yes  Patient reports no fear of animals Yes  Patient reports no history of cruelty to animals Yes  Patient understands his/her participation is voluntary Yes  Behavioral Response: Did not attend.    Clinical Observations/Feedback: Patient discussed with MD for appropriateness in pet therapy session. Both LRT and MD agree patient is appropriate for participation. Patient offered participation in session and signed necessary consent form without issue. Despite signing consent form patient did not choose to attend session. Upon approach patient affect flat and appears paranoid. Patient spoke with his hand over his mouth during interaction, making it difficult for LRT to understand patient.   Marykay Lexenise L Mikahla Wisor, LRT/CTRS        Jearl KlinefelterBlanchfield, Charmika Macdonnell L 03/04/2017 3:23 PM
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
## 2017-03-04 NOTE — Progress Notes (Signed)
Patient noted to be pacing the hall for the majority of the shift.  Patient covers his mouth with his hand but is noted to be responding to unseen other and laughing.  Patient is compliant with medications and has attended groups.  Patient has exhibited no incidents of behavioral dyscontrol   Assess patient for safety, offer medications as prescribed, engage patient in 1:1 staff talks.   Continue to monitor as prescribed. Patient able to maintain safety this shift.
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
## 2017-03-05 NOTE — Progress Notes (Signed)
Recreation Therapy Notes  Date: 03/05/17 Time: 1000 Location: [address] Dayroom  Group Topic: Wellness  Goal Area(s) Addresses:  Patient will define components of whole wellness. Patient will verbalize benefit of whole wellness.  Behavioral Response: Engaged  Intervention:  2 small beach balls, chairs  Activity: Group Juggle.  Patients were seated in a circle.  Patients were to pass the ball back and forth to each other.  Once the the patients got the first ball going in a good rotation, LRT added a second ball.  Patients had to keep both balls going.  The balls could bounce off the floor but the could not come to a complete stop.  Education: Wellness, Building control surveyorDischarge Planning.   Education Outcome: Acknowledges education/In group clarification offered/Needs additional education.   Clinical Observations/Feedback:  Pt still kept his blanket in front of his mouth. There were times when pt would move the blanket from his face and he would be smiling. Pt did a little socializing with peers.  Pt left early and did not return.     Caroll RancherMarjette Candies Palm, LRT/CTRS         Caroll RancherLindsay, Finley Dinkel A 03/05/2017 12:26 PM

## 2017-03-05 NOTE — Progress Notes (Signed)
Patient noted to be pacing the hall for the majority of the shift.  Patient covers his mouth with his hand but is noted to be responding to unseen other and laughing.  Patient is compliant with medications and has attended groups.  Patient has exhibited no incidents of behavioral dyscontrol   Assess patient for safety, offer medications as prescribed, engage patient in 1:1 staff talks.   Continue to monitor as prescribed. Patient able to maintain safety this shift.  

## 2017-03-05 NOTE — Plan of Care (Signed)
Problem: Coping: Goal: Ability to demonstrate self-control will improve Outcome: Progressing Patient became agitated this evening after a visit with family. Patient slammed door to his room but calmed down.

## 2017-03-05 NOTE — Progress Notes (Signed)
Patient is in his room pacing.  Patient can be heard yelling to himself and responding to internal stimuli. Patient appears paranoid and watchful upon approach. Patient is minimal with Clinical research associatewriter and forwards little when answering questions. Patient is guarded and with flat affect.

## 2017-03-05 NOTE — Progress Notes (Signed)
[facility] MD Progress Note  03/05/2017 2:58 PM [name]  MRN:  [id number] Subjective: Patient states " I am fine.'    Objective:Patient seen and chart reviewed.Discussed patient with treatment team.  Pt today seen as calm, less anxious, less paranoid , sleep improved, has been tolerating medications well. Pt per RN continues to have moments when he is seen as responding to self , however overall making progress. Continue treatment.     Principal Problem: Schizoaffective disorder, bipolar type (HCC) Diagnosis:   Patient Active Problem List   Diagnosis Date Noted  . Schizoaffective disorder, bipolar type (HCC) [F25.0] 02/26/2017  . 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 Cannabis use disorder, severe, dependence (HCC) [F12.20] 10/21/2014   Total Time spent with patient: 20 minutes  Past Psychiatric History: see HPI  Past Medical History:  Past Medical History:  Diagnosis Date  . ADHD (attention deficit hyperactivity disorder)    History reviewed. No pertinent surgical history. Family History:  Family History  Problem Relation Age of Onset  . Bipolar disorder Mother   . Diabetes Mother   . Hypertension Mother    Family Psychiatric  History: see HPI Social History:  History  Alcohol Use No     History  Drug Use  . Types: Marijuana    Social History   Social History  . Marital status: Single    Spouse name: N/A  . Number of children: N/A  . Years of education: N/A   Social History Main Topics  . Smoking status: Current Every Day Smoker    Packs/day: 0.25    Types: Cigarettes  . Smokeless tobacco: Never Used  . Alcohol use No  . Drug use: Yes    Types: Marijuana  . Sexual activity: Yes    Birth control/ protection: Condom   Other Topics Concern  . None   Social History Narrative  . None   Additional Social History:   Sleep: fair  Appetite:  Fair  Current  Medications: Current Facility-Administered Medications  Medication Dose Route Frequency Provider Last Rate Last Dose  . alum & mag hydroxide-simeth (MAALOX/MYLANTA) 200-200-20 MG/5ML suspension 30 mL  30 mL Oral Q4H PRN [name], NP      . benzocaine (ORAJEL) 10 % mucosal gel   Mouth/Throat QID PRN [name], PA-C   1 application at 03/02/17 2156  . benztropine (COGENTIN) tablet 1 mg  1 mg Oral QHS [name], NP   1 mg at 03/04/17 2158  . cephALEXin (KEFLEX) capsule 500 mg  500 mg Oral Q8H Simon, Spencer E, PA-C   500 mg at 03/05/17 1478  . cloNIDine HCl (KAPVAY) ER tablet 0.1 mg  0.1 mg Oral QHS Amiley Shishido, MD   0.1 mg at 03/04/17 2158  . hydrOXYzine (ATARAX/VISTARIL) tablet 25 mg  25 mg Oral TID PRN [name], NP   25 mg at 03/02/17 0014  . ibuprofen (ADVIL,MOTRIN) tablet 800 mg  800 mg Oral Q6H PRN Adonis Brook, NP   800 mg at 03/04/17 2158  . lamoTRIgine (LAMICTAL) tablet 75 mg  75 mg Oral Daily [name], MD   75 mg at 03/05/17 0733  . magnesium hydroxide (MILK OF MAGNESIA) suspension 30 mL  30 mL Oral Daily PRN [name], NP      . OLANZapine (ZYPREXA) tablet 5 mg  5 mg Oral TID PRN [name], MD       Or  .  OLANZapine (ZYPREXA) injection 5 mg  5 mg Intramuscular TID PRN Chelsi Warr, Levin BaconSaramma, MD      . risperiDONE (RISPERDAL) tablet 1 mg  1 mg Oral BH-q7a Cobos, Rockey SituFernando A, MD   1 mg at 03/05/17 0734  . risperiDONE (RISPERDAL) tablet 4 mg  4 mg Oral QHS Jomarie LongsEappen, Etheridge Geil, MD   4 mg at 03/04/17 2158  . risperiDONE microspheres (RISPERDAL CONSTA) injection 25 mg  25 mg Intramuscular Q14 Days Najmo Pardue, MD      . traZODone (DESYREL) tablet 100 mg  100 mg Oral QHS Reed Dady, Levin BaconSaramma, MD   100 mg at 03/04/17 2158    Lab Results:  No results found for this or any previous visit (from the past 48 hour(s)).  Blood Alcohol level:  Lab Results  Component Value Date   ETH <5 02/23/2017   ETH <5 06/03/2015    Metabolic Disorder Labs: Lab  Results  Component Value Date   HGBA1C 5.2 02/27/2017   MPG 103 02/27/2017   No results found for: PROLACTIN Lab Results  Component Value Date   CHOL 176 02/27/2017   TRIG 61 02/27/2017   HDL 55 02/27/2017   CHOLHDL 3.2 02/27/2017   VLDL 12 02/27/2017   LDLCALC 109 (H) 02/27/2017    Physical Findings: AIMS: Facial and Oral Movements Muscles of Facial Expression: None, normal Lips and Perioral Area: None, normal Jaw: None, normal Tongue: None, normal,Extremity Movements Upper (arms, wrists, hands, fingers): None, normal Lower (legs, knees, ankles, toes): None, normal, Trunk Movements Neck, shoulders, hips: None, normal, Overall Severity Severity of abnormal movements (highest score from questions above): None, normal Incapacitation due to abnormal movements: None, normal Patient's awareness of abnormal movements (rate only patient's report): No Awareness, Dental Status Current problems with teeth and/or dentures?: No Does patient usually wear dentures?: No  CIWA:    COWS:     Musculoskeletal: Strength & Muscle Tone: within normal limits Gait & Station: normal Patient leans: N/A  Psychiatric Specialty Exam: Physical Exam  Nursing note and vitals reviewed.   Review of Systems  Psychiatric/Behavioral: Positive for substance abuse. The patient is nervous/anxious.   All other systems reviewed and are negative.  tooth ache positive  Blood pressure 139/79, pulse 75, temperature 97.8 F (36.6 C), temperature source Oral, resp. rate 18, height 5' 11.25" (1.81 m), weight 100.2 kg (221 lb).Body mass index is 30.61 kg/m.  General Appearance: casual, face more visible with hair braids tied to the back  Eye Contact:  Fair  Speech:  Normal Rate  Volume:  Normal  Mood:  Anxious  Affect:  Congruent  Thought Process:  Linear and Descriptions of Associations: Circumstantial  Orientation:  Full (Time, Place, and Person)  Thought Content:  Paranoid Ideation,laughing to self at  times , improving  Suicidal Thoughts:  No   Homicidal Thoughts:  No  Memory: recent, remote, immediate - fair  Judgement:  Impaired  Insight:  Shallow  Psychomotor Activity:  Normal  Concentration:  Concentration: Fair and Attention Span: Fair  Recall:  [employer]Fair  Fund of Knowledge:  Fair  Language:  Fair  Akathisia:  Negative  Handed:  Right  AIMS (if indicated):     Assets:  Desire for Improvement Physical Health Resilience  ADL's:   fair  Cognition:  WNL  Sleep:  Number of Hours: 4   03/03/17 Mother left a message for writer stating she wants to take her son back home when he is ready.   Schizoaffective disorder, bipolar type (HCC) unstable -  improving  Will continue today 03/05/17  plan as below except where it is noted.       Treatment plan:Pt with hx of mood lability as well as paranoia , aggression at home as well as cannabis abuse , presented IVCed - had thoughts to hurt mother on admission , has been making progress. Will continue treatment.   Plan: Will continue Risperidone to 4 mg po qhs and 1 mg po daily for psychosis. Give Risperdal consta IM 25 mg IM today ( 03/05/17) repeat q14 days. Will continue Lamictal to 75 mg po daily for mood sx. Continue Trazodone 100 mg po qhs for insomnia. Cogentin 1 mg po qhs for EPS. Clonidine 0.1 mg po qhs for impulsivity. Continue PRN pain management for tooth ache. Keflex 500 mg po q8h for tooth ache. CSW will continue to work on disposition.Possible discharge on Thursday if continues to be stable.  Schneider Warchol, MD  03/05/2017, 2:58 PM

## 2017-03-05 NOTE — Progress Notes (Signed)
Nursing Progress Note 1900-0730  D) Patient presents with flat and irritable affect this evening. Patient's mother visited the unit this evening shortly and patient became upset. Patient slammed door but was able to calm down in his room. Patient observed talking to himself in his room. Patient covers mouth when talking to writer and requested PM medications. Patient requested pain medication for his hands. Patient's hands are swollen and bruised. Patient is isolative to room. Patient denies SI/HI. Patient contracts for safety on the unit.  A)  Emotional support given. 1:1 interaction and active listening provided. Patient medicated as prescribed. Medications and plan of care reviewed with patient. Ice pack and ibuprofen given for pain. Snacks and fluids provided. Opportunities for questions or concerns presented to patient. Patient encouraged to continue to work on treatment goals. Labs, vital signs and patient behavior monitored throughout shift. Patient safety maintained with q15 min safety checks. Low fall risk precautions in place and reviewed with patient; patient verbalized understanding.  R) Patient receptive to interaction with nurse. Patient remains safe on the unit at this time. Patient denies any adverse medication reactions at this time. Patient is resting in bed without complaints. Will continue to monitor.

## 2017-03-05 NOTE — [facility] Group Notes (Signed)
[facility] Group Notes:  (Counselor/Nursing/MHT/Case Management/Adjunct)  03/05/2017 1:15PM  Type of Therapy:  Group Therapy  Participation Level:  Active  Participation Quality:  Appropriate  Affect:  Flat  Cognitive:  Oriented  Insight:  Improving  Engagement in Group:  Limited  Engagement in Therapy:  Limited  Modes of Intervention:  Discussion, Exploration and Socialization  Summary of Progress/Problems: The topic for group was balance in life.  Pt participated in the discussion about when their life was in balance and out of balance and how this feels.  Pt discussed ways to get back in balance and short term goals they can work on to get where they want to be. Stayed the entire time, engaged throughout, in a flat, stand-offish kind of way.  "I'm unbalanced today because I don't know what you are going to do."  Clarified that he does not know when he will discharge, and he doesn't like the feeling of being confined and not having control.  Unable to identify anything that helps him find balance.   [name], [name] 03/05/2017 3:22 PM

## 2017-03-06 MED ORDER — ACETAMINOPHEN 325 MG PO TABS
ORAL_TABLET | ORAL | Status: AC
  Filled 2017-03-06: qty 2

## 2017-03-06 MED ORDER — TRAZODONE HCL 150 MG PO TABS
150.0000 mg | ORAL_TABLET | Freq: Every evening | ORAL | Status: DC | PRN
  Administered 2017-03-06: 150 mg via ORAL
  Filled 2017-03-06: qty 7
  Filled 2017-03-06: qty 1

## 2017-03-06 NOTE — Progress Notes (Signed)
Adult Psychoeducational Group Note  Date:  03/06/2017 Time:  10:45 AM  Group Topic/Focus:  Making Healthy Choices:   The focus of this group is to help patients identify negative/unhealthy choices they were using prior to admission and identify positive/healthier coping strategies to replace them upon discharge.  Participation Level:  Active  Participation Quality:  Appropriate and Attentive  Affect:  Appropriate  Cognitive:  Alert and Appropriate  Insight: Appropriate  Engagement in Group:  Lacking  Modes of Intervention:  Discussion, Education and Exploration     [name] 03/06/2017, 10:45 AM

## 2017-03-06 NOTE — Plan of Care (Signed)
Problem: Activity: Goal: Sleeping patterns will improve Outcome: Not Progressing Patient slept 3 hours last night. Patient is awake in his room pacing and talking to himself.

## 2017-03-06 NOTE — Progress Notes (Signed)
Nursing Progress Note 1900-0730  D) Patient continues to respond to internal stimuli. Patient appears paranoid and watchful upon approach. Patient is guarded and with flat affect. Patient takes medications as prescribed and asked for a heat pack for his hands. Patient isolative to room. Patient denies SI/HI/AVH or pain. Patient contracts for safety on the unit.   A)  Emotional support given. 1:1 interaction and active listening provided. Patient medicated as prescribed. Snacks and fluids provided. Opportunities for questions or concerns presented to patient. Patient encouraged to continue to work on treatment goals. Labs, vital signs and patient behavior monitored throughout shift. Patient safety maintained with q15 min safety checks. Low fall risk precautions in place and reviewed with patient; patient verbalized understanding.  R) Patient receptive to interaction with nurse. Patient remains safe on the unit at this time. Patient denies any adverse medication reactions at this time. Patient is resting in bed without complaints. Will continue to monitor.

## 2017-03-06 NOTE — Progress Notes (Signed)
DAR NOTE: Patient presents with irritable mood affect and mood.  Denies auditory and visual hallucinations.  Described energy level as normal and concentration as good.  Patient became agitated due change regarding discharge plan for today.  MD reassured patient about discharging home soon.  Rates depression at 0, hopelessness at 3, and anxiety at 0.  Maintained on routine safety checks.  Medications given as prescribed.  Support and encouragement offered as needed.  Attended group and participated.  States goal for today is "getting out of here."  Patient observed pacing the hallway.  No interaction with staff or peers.

## 2017-03-06 NOTE — Progress Notes (Signed)
Pt did not attend the evening karaoke group. Pt remained in his room during group. Caswell Corwinwen, Keina Mutch C, NT 03/06/17 9:49 PM

## 2017-03-06 NOTE — Tx Team (Signed)
Interdisciplinary Treatment and Diagnostic Plan Update  03/06/2017 Time of Session: 8:55 AM  [name] MRN: [id number]  Principal Diagnosis: Schizoaffective disorder, bipolar type ([place])  Secondary Diagnoses: Principal Problem:   Schizoaffective disorder, bipolar type ([place]) Active Problems:   Cannabis use disorder, severe, dependence ([place])   Current Medications:  Current Facility-Administered Medications  Medication Dose Route Frequency Provider Last Rate Last Dose  . alum & mag hydroxide-simeth (MAALOX/MYLANTA) 200-200-20 MG/5ML suspension 30 mL  30 mL Oral Q4H PRN Patrecia Pour, NP      . benzocaine (ORAJEL) 10 % mucosal gel   Mouth/Throat QID PRN Laverle Hobby, PA-C   1 application at 94/49/67 2156  . benztropine (COGENTIN) tablet 1 mg  1 mg Oral QHS Patrecia Pour, NP   1 mg at 03/05/17 2134  . cephALEXin (KEFLEX) capsule 500 mg  500 mg Oral Q8H Simon, Spencer E, PA-C   500 mg at 03/06/17 0744  . cloNIDine HCl (KAPVAY) ER tablet 0.1 mg  0.1 mg Oral QHS Eappen, Saramma, MD   0.1 mg at 03/05/17 2134  . hydrOXYzine (ATARAX/VISTARIL) tablet 25 mg  25 mg Oral TID PRN Patrecia Pour, NP   25 mg at 03/02/17 0014  . ibuprofen (ADVIL,MOTRIN) tablet 800 mg  800 mg Oral Q6H PRN Kerrie Buffalo, NP   800 mg at 03/04/17 2158  . lamoTRIgine (LAMICTAL) tablet 75 mg  75 mg Oral Daily Ursula Alert, MD   75 mg at 03/06/17 0744  . magnesium hydroxide (MILK OF MAGNESIA) suspension 30 mL  30 mL Oral Daily PRN Patrecia Pour, NP      . OLANZapine (ZYPREXA) tablet 5 mg  5 mg Oral TID PRN Ursula Alert, MD       Or  . OLANZapine (ZYPREXA) injection 5 mg  5 mg Intramuscular TID PRN Eappen, Ria Clock, MD      . risperiDONE (RISPERDAL) tablet 1 mg  1 mg Oral BH-q7a Cobos, Myer Peer, MD   1 mg at 03/06/17 0744  . risperiDONE (RISPERDAL) tablet 4 mg  4 mg Oral QHS Ursula Alert, MD   4 mg at 03/05/17 2134  . risperiDONE microspheres (RISPERDAL CONSTA) injection 25 mg  25 mg Intramuscular Q14  Days Eappen, Ria Clock, MD   25 mg at 03/05/17 1604  . traZODone (DESYREL) tablet 100 mg  100 mg Oral QHS Ursula Alert, MD   100 mg at 03/05/17 2134    PTA Medications: Prescriptions Prior to Admission  Medication Sig Dispense Refill Last Dose  . benztropine (COGENTIN) 1 MG tablet Take 1 tablet (1 mg total) by mouth at bedtime. (Patient not taking: Reported on 02/23/2017) 30 tablet 0 Not Taking at Unknown time  . hydrOXYzine (ATARAX/VISTARIL) 25 MG tablet Take 25 mg (one tablet) three times a day as needed for anxiety/agitation (Patient not taking: Reported on 02/23/2017) 45 tablet 0 Not Taking at Unknown time  . lamoTRIgine (LAMICTAL) 25 MG tablet Take 2 tablets (50 mg total) by mouth daily. (Patient not taking: Reported on 02/23/2017) 60 tablet 0 Not Taking at Unknown time  . naproxen sodium (ANAPROX) 220 MG tablet Take 220 mg by mouth 2 (two) times daily with a meal. Tooth pain   02/22/2017 at Unknown time  . PRESCRIPTION MEDICATION Inject 400 mg into the muscle every 30 (thirty) days. abilify ER 471m injection   APRIL  . risperiDONE (RISPERDAL) 3 MG tablet Take 1 tablet (3 mg total) by mouth at bedtime. (Patient not taking: Reported on 02/23/2017) 30  tablet 0 Not Taking at Unknown time  . traZODone (DESYREL) 50 MG tablet Take 1 tablet (50 mg total) by mouth at bedtime. (Patient not taking: Reported on 02/23/2017) 30 tablet 0 Not Taking at Unknown time    Treatment Modalities: Medication Management, Group therapy, Case management,  1 to 1 session with clinician, Psychoeducation, Recreational therapy.   Physician Treatment Plan for Primary Diagnosis: Schizoaffective disorder, bipolar type ([name]) Long Term Goal(s): Improvement in symptoms so as ready for discharge  Short Term Goals: Ability to verbalize feelings will improve Compliance with prescribed medications will improve Ability to identify triggers associated with substance abuse/mental health issues will improve Ability to verbalize  feelings will improve Compliance with prescribed medications will improve Ability to identify triggers associated with substance abuse/mental health issues will improve  Medication Management: Evaluate patient's response, side effects, and tolerance of medication regimen.  Therapeutic Interventions: 1 to 1 sessions, Unit Group sessions and Medication administration.  Evaluation of Outcomes: Adequate for d/c   5/22:  Pt with hx of mood lability, paranoia , aggression and cannabis abuse , presented IVC ed for making threats to hurt mother , is making progress, tolerating medications well,  Plan: Will increase Risperidone to 4 mg po qhs and 1 mg po daily for psychosis. Will offer Risperdal consta IM prior to discharge. Will increase Lamictal to 75 mg po daily for mood sx. Continue Trazodone 100 mg po qhs for insomnia. Cogentin 1 mg po qhs for EPS. Clonidine 0.1 mg po qhs for impulsivity. Continue PRN pain management for tooth ache. Keflex 500 mg po q8h for tooth ache.  Physician Treatment Plan for Secondary Diagnosis: Principal Problem:   Schizoaffective disorder, bipolar type ([place]) Active Problems:   Cannabis use disorder, severe, dependence ([place])   Long Term Goal(s): Improvement in symptoms so as ready for discharge  Short Term Goals: Ability to verbalize feelings will improve Compliance with prescribed medications will improve Ability to identify triggers associated with substance abuse/mental health issues will improve Ability to verbalize feelings will improve Compliance with prescribed medications will improve Ability to identify triggers associated with substance abuse/mental health issues will improve  Medication Management: Evaluate patient's response, side effects, and tolerance of medication regimen.  Therapeutic Interventions: 1 to 1 sessions, Unit Group sessions and Medication administration.  Evaluation of Outcomes: Adequate for Discharge   RN Treatment Plan  for Primary Diagnosis: Schizoaffective disorder, bipolar type ([place]) Long Term Goal(s): Knowledge of disease and therapeutic regimen to maintain health will improve  Short Term Goals: Ability to identify and develop effective coping behaviors will improve and Compliance with prescribed medications will improve  Medication Management: RN will administer medications as ordered by provider, will assess and evaluate patient's response and provide education to patient for prescribed medication. RN will report any adverse and/or side effects to prescribing provider.  Therapeutic Interventions: 1 on 1 counseling sessions, Psychoeducation, Medication administration, Evaluate responses to treatment, Monitor vital signs and CBGs as ordered, Perform/monitor CIWA, COWS, AIMS and Fall Risk screenings as ordered, Perform wound care treatments as ordered.  Evaluation of Outcomes: Adequate for Discharge    Recreational Therapy Treatment Plan for Primary Diagnosis: Schizoaffective disorder, bipolar type ([place]) Long Term Goal(s): Patient will participate in recreation therapy treatment in at least 2 group sessions without prompting from LRT  Short Term Goals: Patient will demonstrate increased ability to follow instructions, as demonstrated by ability to follow LRT instructions on first prompt during recreation therapy group sessions  Treatment Modalities: Group and Pet Therapy  Therapeutic Interventions: Psychoeducation  Evaluation of Outcomes: Adequate for Discharge   LCSW Treatment Plan for Primary Diagnosis: Schizoaffective disorder, bipolar type ([place]) Long Term Goal(s): Safe transition to appropriate next level of care at discharge, Engage patient in therapeutic group addressing interpersonal concerns.  Short Term Goals: Engage patient in aftercare planning with referrals and resources  Therapeutic Interventions: Assess for all discharge needs, 1 to 1 time with Social worker, Explore available  resources and support systems, Assess for adequacy in community support network, Educate family and significant other(s) on suicide prevention, Complete Psychosocial Assessment, Interpersonal group therapy.  Evaluation of Outcomes: Met  Return home, follow up [facility]   Progress in Treatment: Attending groups: No Participating in groups: No Taking medication as prescribed: Yes Toleration medication: Yes, no side effects reported at this time Family/Significant other contact made: Yes Patient understands diagnosis: No  Limited insight Discussing patient identified problems/goals with staff: Yes Medical problems stabilized or resolved: Yes Denies suicidal/homicidal ideation: Yes Issues/concerns per patient self-inventory: None Other: N/A  New problem(s) identified: None identified at this time.   New Short Term/Long Term Goal(s): None identified at this time.   Discharge Plan or Barriers:   Reason for Continuation of Hospitalization:  Hallucinations Medication stabilization   Estimated Length of Stay: Likely d/c tomorrow  Attendees: Patient: 03/06/2017  8:55 AM  Physician: Neita Garnet, MD 03/06/2017  8:55 AM  Nursing: Otilio Carpen, RN 03/06/2017  8:55 AM  RN Care Manager: Lars Pinks, RN 03/06/2017  8:55 AM  Social Worker: Ripley Fraise 03/06/2017  8:55 AM  Recreational Therapist: Victorino Sparrow, LRT/CTRS 03/06/2017  8:55 AM  Other: Norberto Sorenson 03/06/2017  8:55 AM  Other:  03/06/2017  8:55 AM    Scribe for Treatment Team:  Roque Lias LCSW 03/06/2017 8:55 AM

## 2017-03-06 NOTE — [facility] Group Notes (Signed)
[facility]  03/06/2017 , 5:24 PM    Type of Therapy:  [facility] [employer] Presentation  Participation Level:  Active  Participation Quality:  Attentive  Affect:  Blunted  Cognitive:  Oriented  Insight:  Limited  Engagement in Therapy:  Engaged  Modes of Intervention:  Discussion, Education and Socialization  Summary of Progress/Problems:  Onalee HuaDavid from [facility] [employer] came to present his recovery story and play the guitar.  Stayed the entire time, attentive throughout.  Daryel Geraldorth, Shresta Risden B 03/06/2017 , 5:24 PM

## 2017-03-06 NOTE — Progress Notes (Addendum)
[facility] MD Progress Note  03/06/2017 3:48 PM DEMIAN MAISEL  MRN:  [id number] Subjective: patient reports feeling irritable today, as he states he had been expecting discharge today. States that he slept poorly last night, unsure why, possibly related to anxiety in anticipation of discharge. Denies medication side effects.  Objective: I have discussed case with treatment team and have met with patient. Patient is presenting with partially improved grooming, and is more verbal and communicative than when I last saw him last week. At the time he had also presented with some bizarre posturing, persistently holding up a hand against his face, and with an intense stare. At this time these are not noticed .  He presents vaguely irritable, pacing , with fair eye contact, but he is more responsive to reassurance and support and affect improved during session. With patient's express consent I spoke with his mother via phone, who states that patient seems improved compared to admission and is in agreement with discharging home soon. Denies medication side effects. We discussed insomnia with patient, he reports he thinks it is related to being accustomed to work third shift so that he is not as sleepy at night, and also to anticipation anxiety as he approaches dishcarge .       Principal Problem: Schizoaffective disorder, bipolar type ([place]) Diagnosis:   Patient Active Problem List   Diagnosis Date Noted  . Schizoaffective disorder, bipolar type ([place]) [F25.0] 02/26/2017  . 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 Cannabis use disorder, severe, dependence ([place]) [F12.20] 10/21/2014   Total Time spent with patient: 20 minutes  Past Psychiatric History: see HPI  Past Medical History:  Past Medical History:  Diagnosis Date  . ADHD (attention deficit hyperactivity disorder)    History reviewed. No pertinent surgical  history. Family History:  Family History  Problem Relation Age of Onset  . Bipolar disorder Mother   . Diabetes Mother   . Hypertension Mother    Family Psychiatric  History: see HPI Social History:  History  Alcohol Use No     History  Drug Use  . Types: Marijuana    Social History   Social History  . Marital status: Single    Spouse name: N/A  . Number of children: N/A  . Years of education: N/A   Social History Main Topics  . Smoking status: Current Every Day Smoker    Packs/day: 0.25    Types: Cigarettes  . Smokeless tobacco: Never Used  . Alcohol use No  . Drug use: Yes    Types: Marijuana  . Sexual activity: Yes    Birth control/ protection: Condom   Other Topics Concern  . None   Social History Narrative  . None   Additional Social History:   Sleep: slept poorly last night- 3-4 hours .   Appetite:  Fair  Current Medications: Current Facility-Administered Medications  Medication Dose Route Frequency Provider Last Rate Last Dose  . alum & mag hydroxide-simeth (MAALOX/MYLANTA) 200-200-20 MG/5ML suspension 30 mL  30 mL Oral Q4H PRN Patrecia Pour, NP      . benzocaine (ORAJEL) 10 % mucosal gel   Mouth/Throat QID PRN Laverle Hobby, PA-C   1 application at 36/62/94 2156  . benztropine (COGENTIN) tablet 1 mg  1 mg Oral QHS Patrecia Pour, NP   1 mg at 03/05/17 2134  . cephALEXin (KEFLEX) capsule 500 mg  500 mg Oral Q8H Simon, Maurine Minister,  PA-C   500 mg at 03/06/17 0744  . cloNIDine HCl (KAPVAY) ER tablet 0.1 mg  0.1 mg Oral QHS Eappen, Saramma, MD   0.1 mg at 03/05/17 2134  . hydrOXYzine (ATARAX/VISTARIL) tablet 25 mg  25 mg Oral TID PRN Patrecia Pour, NP   25 mg at 03/02/17 0014  . ibuprofen (ADVIL,MOTRIN) tablet 800 mg  800 mg Oral Q6H PRN Kerrie Buffalo, NP   800 mg at 03/04/17 2158  . lamoTRIgine (LAMICTAL) tablet 75 mg  75 mg Oral Daily Ursula Alert, MD   75 mg at 03/06/17 0744  . magnesium hydroxide (MILK OF MAGNESIA) suspension 30 mL  30 mL  Oral Daily PRN Patrecia Pour, NP      . OLANZapine (ZYPREXA) tablet 5 mg  5 mg Oral TID PRN Ursula Alert, MD       Or  . OLANZapine (ZYPREXA) injection 5 mg  5 mg Intramuscular TID PRN Eappen, Ria Clock, MD      . risperiDONE (RISPERDAL) tablet 1 mg  1 mg Oral BH-q7a Cobos, Myer Peer, MD   1 mg at 03/06/17 0744  . risperiDONE (RISPERDAL) tablet 4 mg  4 mg Oral QHS Ursula Alert, MD   4 mg at 03/05/17 2134  . risperiDONE microspheres (RISPERDAL CONSTA) injection 25 mg  25 mg Intramuscular Q14 Days Eappen, Ria Clock, MD   25 mg at 03/05/17 1604  . traZODone (DESYREL) tablet 100 mg  100 mg Oral QHS Eappen, Ria Clock, MD   100 mg at 03/05/17 2134    Lab Results:  No results found for this or any previous visit (from the past 48 hour(s)).  Blood Alcohol level:  Lab Results  Component Value Date   ETH <5 02/23/2017   ETH <5 97/11/6376    Metabolic Disorder Labs: Lab Results  Component Value Date   HGBA1C 5.2 02/27/2017   MPG 103 02/27/2017   No results found for: PROLACTIN Lab Results  Component Value Date   CHOL 176 02/27/2017   TRIG 61 02/27/2017   HDL 55 02/27/2017   CHOLHDL 3.2 02/27/2017   VLDL 12 02/27/2017   LDLCALC 109 (H) 02/27/2017    Physical Findings: AIMS: Facial and Oral Movements Muscles of Facial Expression: None, normal Lips and Perioral Area: None, normal Jaw: None, normal Tongue: None, normal,Extremity Movements Upper (arms, wrists, hands, fingers): None, normal Lower (legs, knees, ankles, toes): None, normal, Trunk Movements Neck, shoulders, hips: None, normal, Overall Severity Severity of abnormal movements (highest score from questions above): None, normal Incapacitation due to abnormal movements: None, normal Patient's awareness of abnormal movements (rate only patient's report): No Awareness, Dental Status Current problems with teeth and/or dentures?: No Does patient usually wear dentures?: No  CIWA:    COWS:     Musculoskeletal: Strength &  Muscle Tone: within normal limits Gait & Station: normal Patient leans: N/A  Psychiatric Specialty Exam: Physical Exam  Nursing note and vitals reviewed.   Review of Systems  Psychiatric/Behavioral: Positive for substance abuse. The patient is nervous/anxious.   All other systems reviewed and are negative. reports tooth pain, no fever, no chills   Blood pressure (!) 144/69, pulse 73, temperature 98.1 F (36.7 C), temperature source Oral, resp. rate 20, height 5' 11.25" (1.81 m), weight 100.2 kg (221 lb).Body mass index is 30.61 kg/m.  General Appearance: improving grooming compared to initial presentation  Eye Contact:  fair- improved   Speech:  Normal Rate  Volume:  initially loud, improves as session progresses   Mood:  states mood is "OK", denies depression, expresses anxiety and irritability about not having been discharged today   Affect:  irritable, but improves significantly during session  Thought Process:  Linear and Descriptions of Associations: Circumstantial- thought process better organized   Orientation:  Other:  fully alert and attentive   Thought Content: denies hallucinations, no delusions expressed at this time, patient noted by staff to talk to self . Patient denies hallucinatory experiences as reason, states that he raps to himself in order to pass the time  Suicidal Thoughts:  No - denies suicidal or self injurious ideations, denies any homicidal or violent ideations, and specifically also denies any homicidal or violent ideations towards mother   Homicidal Thoughts:  No  Memory: recent and remote fair   Judgement:  Fair  Insight:  Fair  Psychomotor Activity:  some pacing due to irritability, improved with support, encouragement, empathy   Concentration:  Concentration: Fair and Attention Span: Fair  Recall:  [employer] of Knowledge:  Fair  Language:  Fair  Akathisia:  No  Handed:  Right  AIMS (if indicated):     Assets:  Desire for Improvement Physical  Health  ADL's:   Improving   Cognition:  WNL  Sleep:  Number of Hours: 3.25    Assessment - patient presenting with improvement compared to admission - in particular, presents better groomed, without unusual postures or motor behaviors, with improved rate and volume of speech. Although still tends to keep to self, this has improved, and today was noted to spend time in the recreational area with other patients playing basketball. Patient slept poorly last night, which he states was in anticipation of discharging.  Denies medication side effects       Treatment plan reviewed as below today 5/24.   Plan: Will continue Risperidone 1 mgr QAM and to 4 mg QHS and 1 mg po daily for psychosis.  Received Risperdal Consta IM 25 mg IM 03/05/17-  repeat q14 days. Continue Lamictal to 75 mg QDAY  for mood sx. Increase Trazodone to 150  mg QHS PRN for insomnia. Continue Cogentin 1 mg po QHS for EPS. Continue Clonidine 0.1 mg po QHS for impulsivity. Continue Keflex 500 mg po q8H for tooth ache. Treatment team working on disposition [place], MD  03/06/2017, 3:48 PM  Patient ID: Leonie Man, male   DOB: 07-24-1996, 22 y.o.   MRN: [id number]

## 2017-03-07 MED ORDER — BENZTROPINE MESYLATE 1 MG PO TABS: 1.0000 mg | ORAL_TABLET | Freq: Every day | ORAL | 0 refills | Status: DC

## 2017-03-07 MED ORDER — TRAZODONE HCL 150 MG PO TABS: 150.0000 mg | ORAL_TABLET | Freq: Every evening | ORAL | 0 refills | Status: DC | PRN

## 2017-03-07 MED ORDER — RISPERIDONE 1 MG PO TABS: 1.0000 mg | ORAL_TABLET | ORAL | 0 refills | Status: DC

## 2017-03-07 MED ORDER — CLONIDINE HCL ER 0.1 MG PO TB12: 0.1000 mg | ORAL_TABLET | Freq: Every day | ORAL | 0 refills | Status: DC

## 2017-03-07 MED ORDER — RISPERIDONE 4 MG PO TABS: 4.0000 mg | ORAL_TABLET | Freq: Every day | ORAL | 0 refills | Status: DC

## 2017-03-07 MED ORDER — RISPERIDONE MICROSPHERES 25 MG IM SUSR: 25.0000 mg | INTRAMUSCULAR | 0 refills | Status: DC

## 2017-03-07 MED ORDER — LAMOTRIGINE 25 MG PO TABS: 75.0000 mg | ORAL_TABLET | Freq: Every day | ORAL | 0 refills | Status: DC

## 2017-03-07 NOTE — Progress Notes (Addendum)
Nursing Progress Note 1900-0730  D) Patient presents with flat affect and continues to talk to himself, appearing to be responding to internal stimuli. Patient paces the unit but is calm and cooperative. Patient takes medications as prescribed. Patient did not attend karaoke this evening but was provided with snacks on the unit. Patient denies SI/HI/AVH or pain. Patient contracts for safety on the unit. Patient appears improved this shift. Patient's hair is maintained and out of his face. Patient more visible in the milieu.  A)  Emotional support given. 1:1 interaction and active listening provided. Patient medicated as prescribed. Medications and plan of care reviewed with patient. Snacks and fluids provided. Opportunities for questions or concerns presented to patient. Patient encouraged to continue to work on treatment goals. Labs, vital signs and patient behavior monitored throughout shift. Patient safety maintained with q15 min safety checks. Low fall risk precautions in place and reviewed with patient; patient verbalized understanding.  R) Patient receptive to interaction with nurse. Patient remains safe on the unit at this time. Patient denies any adverse medication reactions at this time. Patient is resting in bed without complaints. Will continue to monitor.

## 2017-03-07 NOTE — Progress Notes (Signed)
  [facility] :  Will you be returning to the same living situation after discharge:  Yes,  home At discharge, do you have transportation home?: Yes,  family Do you have the ability to pay for your medications: Yes,  mental health  Release of information consent forms completed and in the chart;  Patient's signature needed at discharge.  Patient to Follow up at: Follow-up Information    [facility] Follow up on 03/11/2017.   Specialty:  Behavioral Health Why:  Tuesday at 1:45 with Dr Judie PetitM.  Please make sure to bring along hospital d/c paperwork so Dr can see we switched your medications Contact information: [address] [place] [address] [phone]           Next level of care provider [facility] Link:no  Safety Planning and Suicide Prevention discussed: Yes,  yes  Have you used any form of tobacco in the last 30 days? (Cigarettes, Smokeless Tobacco, Cigars, and/or Pipes): Yes  Has patient been referred to the Quitline?: Patient refused referral  Patient has been referred for addiction treatment: Pt. refused referral  Ida RogueRodney B Jakub Debold 03/07/2017, 11:43 AM

## 2017-03-07 NOTE — [facility] Suicide Risk Assessment (Signed)
[employer] Discharge Suicide Risk Assessment   Principal Problem: Schizoaffective disorder, bipolar type [facility]) Discharge Diagnoses:  Patient Active Problem List   Diagnosis Date Noted  . Schizoaffective disorder, bipolar type ([facility]) [F25.0] 02/26/2017  . 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 Cannabis use disorder, severe, dependence ([facility]) [F12.20] 10/21/2014    Total Time spent with patient: 30 minutes  Musculoskeletal: Strength & Muscle Tone: within normal limits Gait & Station: normal Patient leans: N/A  Psychiatric Specialty Exam: ROS improving tooth ache, no chest pain, no shortness of breath, no vomiting, no diarrhea  Blood pressure 112/73, pulse 88, temperature 97.9 F (36.6 C), temperature source Oral, resp. rate 20, height 5' 11.25" (1.81 m), weight 100.2 kg (221 lb).Body mass index is 30.61 kg/m.  General Appearance: improved grooming   Eye Contact::  Good  Speech:  Normal Rate409  Volume:  Normal  Mood:  mood is improved, denies feeling depressed, describes mood as 8/10  Affect:  less blunted, smiles at times appropriately   Thought Process:  Linear  Orientation:  Full (Time, Place, and Person)  Thought Content:  denies hallucinations, no delusions expressed, does not appear internally preoccupied   Suicidal Thoughts:  No denies suicidal or self injurious , denies any homicidal or violent ideations, and also specifically denies any homicidal ideations towards his mother or any family members  Homicidal Thoughts:  No  Memory:  recent and remote grossly intact   Judgement:  Other:  improving   Insight:  improving   Psychomotor Activity:  Normal  Concentration:  Good  Recall:  Good  Fund of Knowledge:Good  Language: Good  Akathisia:  Negative  Handed:  Right  AIMS (if indicated):     Assets:  Desire for Improvement Resilience  Sleep:  Number of Hours: 6.25  Cognition: WNL  ADL's:   Intact   Mental Status Per Nursing Assessment::   On Admission:  NA  Demographic Factors:  21 year old single male , no children, lives with mother and sibling   Loss Factors: Strain in his relationship with his mother- states that since admission he has talked to mother several times and the relationship is now repaired " we are good ".   Historical Factors: History of Schizoaffective Disorder diagnosis, history of prior psychiatric admissions, history of Cannabis Abuse   Risk Reduction Factors:   Sense of responsibility to family, Living with another person, especially a relative and Positive coping skills or problem solving skills  Continued Clinical Symptoms:  At this time patient is alert, attentive, calm, polite, better groomed, presents with improving range of affect, and is not  blunted/restricted in affect as he had been earlier during admission. He is describing his mood as improved, denies depression at this time, no thought disorder , denies hallucinations, no delusions expressed, does not appear internally preoccupied, no delusions expressed, at this time denies suicidal or self injurious ideations, and denies any homicidal or violent ideations, also specifically denying any HI towards mother. With patient's consent I spoke with his mother yesterday, who corroborated patient was improved and expressed being in agreement with discharge today, plan tor return home. Denies medication side effects .  Cognitive Features That Contribute To Risk:  No gross cognitive deficits noted upon discharge. Is alert , attentive, and oriented x 3    Suicide Risk:  Mild:  Suicidal ideation of limited frequency, intensity, duration, and specificity.  There are no identifiable plans, no associated intent, mild  dysphoria and related symptoms, good self-control (both objective and subjective assessment), few other risk factors, and identifiable protective factors, including available and accessible  social support.  Follow-up Information    [facility] Follow up on 03/11/2017.   Specialty:  Behavioral Health Why:  Tuesday at 1:45 with Dr Judie PetitM.  Please make sure to bring along hospital d/c paperwork so Dr can see we switched your medications Contact information: [address] [place] [address] [phone]           Plan Of Care/Follow-up recommendations:  Activity:  as tolerated  Diet:  Regular Tests:  NA Other:  See below Patient is leaving unit in good spirits . Plans to return home. Plans to follow up as above . Craige CottaFernando A Cobos, MD 03/07/2017, 11:53 AM

## 2017-03-07 NOTE — Plan of Care (Signed)
Problem: Health Behavior/Discharge Planning: Goal: Compliance with treatment plan for underlying cause of condition will improve Outcome: Progressing Patient taking medications as prescribed. Patient agreeable to plan of care. Regimen reviewed with patient; reinforcement needed.
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
## 2017-03-07 NOTE — Progress Notes (Signed)
Recreation Therapy Notes  Date: 03/07/17 Time: 1000 Location: 500 Hall Dayroom  Group Topic: Communication, Team Building, Problem Solving  Goal Area(s) Addresses:  Patient will effectively work with peer towards shared goal.  Patient will identify skill used to make activity successful.  Patient will identify how skills used during activity can be used to reach post d/c goals.   Behavioral Response: Engaged  Intervention: STEM Activity   Activity: Glass blower/designeripe Cleaner Tower. In teams, patients were asked to build the tallest freestanding tower possible out of 15 pipe cleaners. Systematically resources were removed, for example patient ability to use both hands and patient ability to verbally communicate.    Education: Pharmacist, communityocial Skills, Building control surveyorDischarge Planning.   Education Outcome: Acknowledges education/In group clarification offered/Needs additional education.   Clinical Observations/Feedback: Pt was quiet but active in group.  Pt had to be redirected not to use both hands.  Pt left early and did not return.   Caroll RancherMarjette Lillyian Heidt, LRT/CTRS         Caroll RancherLindsay, Malaia Buchta A 03/07/2017 12:11 PM

## 2017-03-07 NOTE — Progress Notes (Signed)
Pt discharged to lobby. Pt was stable and appreciative at that time. All papers, samples and prescriptions were given and valuables returned. Verbal understanding expressed. Denies SI/HI and A/VH. Pt given opportunity to express concerns and ask questions.  

## 2017-03-07 NOTE — Plan of Care (Signed)
Problem: [facility] Participation in Recreation Therapeutic Interventions Goal: STG-Patient will attend/participate in Rec Therapy Group Ses STG-The Patient will attend and participate in Recreation Therapy Group Sessions  Outcome: Completed/Met Date Met: 03/07/17 Pt attended and participated in communication, wellness, anger management and self esteem recreation therapy sessions.   , LRT/CTRS   

## 2017-03-25 ENCOUNTER — Emergency Department (HOSPITAL_COMMUNITY)
Admission: EM | Admit: 2017-03-25 | Discharge: 2017-03-26 | Disposition: A | Discharge status: Other Acute Inpatient Hospital | Payer: Self-pay | Attending: Emergency Medicine | Admitting: Emergency Medicine

## 2017-03-25 ENCOUNTER — Encounter (HOSPITAL_COMMUNITY): Payer: Self-pay | Admitting: Nurse Practitioner

## 2017-03-25 DIAGNOSIS — F17201 Nicotine dependence, unspecified, in remission: Secondary | ICD-10-CM | POA: Insufficient documentation

## 2017-03-25 DIAGNOSIS — F12251 Cannabis dependence with psychotic disorder with hallucinations: Secondary | ICD-10-CM | POA: Diagnosis present

## 2017-03-25 DIAGNOSIS — Z79899 Other long term (current) drug therapy: Secondary | ICD-10-CM | POA: Insufficient documentation

## 2017-03-25 DIAGNOSIS — F250 Schizoaffective disorder, bipolar type: Secondary | ICD-10-CM | POA: Diagnosis present

## 2017-03-25 DIAGNOSIS — F1220 Cannabis dependence, uncomplicated: Secondary | ICD-10-CM | POA: Insufficient documentation

## 2017-03-25 DIAGNOSIS — Z046 Encounter for general psychiatric examination, requested by authority: Secondary | ICD-10-CM | POA: Insufficient documentation

## 2017-03-25 HISTORY — DX: Schizophrenia, unspecified: F20.9

## 2017-03-25 LAB — CBC
HCT: 47.5 % (ref 39.0–52.0)
HEMOGLOBIN: 16.4 g/dL (ref 13.0–17.0)
MCH: 32 pg (ref 26.0–34.0)
MCHC: 34.5 g/dL (ref 30.0–36.0)
MCV: 92.6 fL (ref 78.0–100.0)
PLATELETS: 215 10*3/uL (ref 150–400)
RBC: 5.13 MIL/uL (ref 4.22–5.81)
RDW: 14.6 % (ref 11.5–15.5)
WBC: 7.1 10*3/uL (ref 4.0–10.5)

## 2017-03-25 LAB — BASIC METABOLIC PANEL
ANION GAP: 5 (ref 5–15)
BUN: 16 mg/dL (ref 6–20)
CO2: 26 mmol/L (ref 22–32)
Calcium: 9.4 mg/dL (ref 8.9–10.3)
Chloride: 109 mmol/L (ref 101–111)
Creatinine, Ser: 1.11 mg/dL (ref 0.61–1.24)
GFR calc non Af Amer: >60 mL/min (ref >60)
Glucose, Bld: 104 mg/dL — ABNORMAL HIGH (ref 65–99)
POTASSIUM: 4 mmol/L (ref 3.5–5.1)
SODIUM: 140 mmol/L (ref 135–145)

## 2017-03-25 LAB — ETHANOL

## 2017-03-25 MED ORDER — CLONIDINE HCL ER 0.1 MG PO TB12
0.1000 mg | ORAL_TABLET | Freq: Every day | ORAL | Status: DC
  Administered 2017-03-25: 0.1 mg via ORAL
  Filled 2017-03-25: qty 1

## 2017-03-25 MED ORDER — RISPERIDONE 2 MG PO TABS
4.0000 mg | ORAL_TABLET | Freq: Every day | ORAL | Status: DC
  Administered 2017-03-25: 4 mg via ORAL
  Filled 2017-03-25: qty 2

## 2017-03-25 MED ORDER — BENZTROPINE MESYLATE 1 MG PO TABS
1.0000 mg | ORAL_TABLET | Freq: Every day | ORAL | Status: DC
  Administered 2017-03-25: 1 mg via ORAL
  Filled 2017-03-25: qty 1

## 2017-03-25 MED ORDER — TRAZODONE HCL 50 MG PO TABS
150.0000 mg | ORAL_TABLET | Freq: Every evening | ORAL | Status: DC | PRN
  Administered 2017-03-25: 150 mg via ORAL
  Filled 2017-03-25: qty 1

## 2017-03-25 MED ORDER — IBUPROFEN 800 MG PO TABS
800.0000 mg | ORAL_TABLET | Freq: Once | ORAL | Status: AC
  Administered 2017-03-25: 800 mg via ORAL
  Filled 2017-03-25: qty 1

## 2017-03-25 MED ORDER — RISPERIDONE 1 MG PO TABS
1.0000 mg | ORAL_TABLET | ORAL | Status: DC
  Administered 2017-03-26: 1 mg via ORAL
  Filled 2017-03-25: qty 1

## 2017-03-25 MED ORDER — LAMOTRIGINE 25 MG PO TABS
75.0000 mg | ORAL_TABLET | Freq: Every day | ORAL | Status: DC
  Administered 2017-03-25 – 2017-03-26 (×2): 75 mg via ORAL
  Filled 2017-03-25 (×2): qty 3
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
## 2017-03-25 NOTE — ED Notes (Addendum)
Pt. Went to the bathroom without giving a urine sample. Nurse aware.

## 2017-03-25 NOTE — ED Notes (Signed)
Patient remains calm and quiet in his room. Sleeping on/off. Guarded and minimal conversation. Denies pain, SI/HI, AH/VH at this time. No behavior issues noted.  Staff offered support and encouragement as needed. Routine safety checks maintained. Will continue to monitor patient.

## 2017-03-25 NOTE — ED Notes (Signed)
[facility] tomorrow am for [facility]; [facility]

## 2017-03-25 NOTE — BH Assessment (Signed)
[facility] Assessment Progress Note  Per Nanine MeansJamison Lord, DNP, this pt requires psychiatric hospitalization at this time.  Pt presents under IVC initiated by his mother and upheld by EDP Cathren LaineKevin Steinl, MD.  The following facilities have been contacted to seek placement for this pt, with results as noted:  Beds available, information sent, decision pending:  [facility]   At capacity:  [facility]   Latima Hamza, [place] Triage Specialist [phone]

## 2017-03-25 NOTE — ED Notes (Signed)
[facility]

## 2017-03-25 NOTE — Progress Notes (Addendum)
[facility]  [facility], pt has been accepted for admission tomorrow morning (03/26/17).  Dr. Paulla Dollyharles Davis accepting, Call report to [phone].  CSW requests a copy of pt's completed IVC to be faxed to [phone].  CSW requests that pt's UA be completed and enter in his MR.  Lucien MonsWL Psych ED Nurse, Marchelle FolksAmanda, notified.  Timmothy EulerJean T. Kaylyn LimSutter, MSW, LCSWA Clinical Social Work Disposition [phone]

## 2017-03-25 NOTE — ED Notes (Signed)
Needs a UDS 
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
## 2017-03-26 LAB — URINALYSIS, ROUTINE W REFLEX MICROSCOPIC
BILIRUBIN URINE: NEGATIVE
Glucose, UA: NEGATIVE mg/dL
Hgb urine dipstick: NEGATIVE
KETONES UR: NEGATIVE mg/dL
Leukocytes, UA: NEGATIVE
NITRITE: NEGATIVE
Protein, ur: NEGATIVE mg/dL
SPECIFIC GRAVITY, URINE: 1.02 (ref 1.005–1.030)
pH: 6.5 (ref 5.0–8.0)

## 2017-03-26 LAB — RAPID URINE DRUG SCREEN, HOSP PERFORMED
Amphetamines: NOT DETECTED
BENZODIAZEPINES: NOT DETECTED
Barbiturates: NOT DETECTED
COCAINE: NOT DETECTED
OPIATES: NOT DETECTED
Tetrahydrocannabinol: POSITIVE — AB

## 2017-03-26 MED ORDER — IBUPROFEN 200 MG PO TABS
600.0000 mg | ORAL_TABLET | Freq: Four times a day (QID) | ORAL | Status: DC | PRN
  Administered 2017-03-26: 600 mg via ORAL
  Filled 2017-03-26: qty 3

## 2017-03-26 NOTE — BH Assessment (Signed)
[facility] Assessment Progress Note  Pt's nurse, Diane, reports that when calling report [facility], they inform her that they will only hold bed for 24 hours after initially accepting him.  This will expire at 15:11.  At 14:59 this Clinical research associatewriter spoke to Jenna LuoKim Yates, Production designer, theatre/television/filmadministrator at [facility].  She agrees to hold bed for this pt, provided he arrives at a reasonable time this afternoon or early this evening.  This has been communicated to pt's nurse, Diane, and to [place] [facility] [facility].  All parties agree to proceed with transfer.  Doylene Canninghomas Letetia Romanello, MA Triage Specialist [phone]

## 2017-03-26 NOTE — ED Notes (Signed)
[facility]. Pt was calm and cooperative. All belongings returned to pt who signed for same.
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
## 2017-03-26 NOTE — ED Notes (Signed)
Introduced self to patient. Pt oriented to unit expectations.  Assessed pt for:  A) Anxiety &/or agitation: Pt has remained in bed this morning with minimal communication. Appears to be sleeping with eyes closed. He took his medication and opened his mouth to verify that he took it.   S) Safety: Safety maintained with q-15-minute checks and hourly rounds by staff.  A) ADLs: Pt able to perform ADLs independently.  P) Pick-Up (room cleanliness): Pt's room clean and free of clutter.

## 2017-03-26 NOTE — Progress Notes (Signed)
03/26/17 1335:  LRT went to pt room, pt was sleep.  Caroll RancherMarjette Breylin Dom, LRT/CTRS

## 2017-06-11 ENCOUNTER — Emergency Department (HOSPITAL_COMMUNITY)
Admission: EM | Admit: 2017-06-11 | Discharge: 2017-06-13 | Disposition: A | Discharge status: Other Acute Inpatient Hospital | Payer: Self-pay | Attending: Emergency Medicine | Admitting: Emergency Medicine

## 2017-06-11 DIAGNOSIS — F909 Attention-deficit hyperactivity disorder, unspecified type: Secondary | ICD-10-CM | POA: Insufficient documentation

## 2017-06-11 DIAGNOSIS — F17210 Nicotine dependence, cigarettes, uncomplicated: Secondary | ICD-10-CM | POA: Insufficient documentation

## 2017-06-11 DIAGNOSIS — Z79899 Other long term (current) drug therapy: Secondary | ICD-10-CM | POA: Insufficient documentation

## 2017-06-11 DIAGNOSIS — F12251 Cannabis dependence with psychotic disorder with hallucinations: Secondary | ICD-10-CM | POA: Diagnosis present

## 2017-06-11 DIAGNOSIS — Z046 Encounter for general psychiatric examination, requested by authority: Secondary | ICD-10-CM | POA: Insufficient documentation

## 2017-06-11 DIAGNOSIS — F250 Schizoaffective disorder, bipolar type: Secondary | ICD-10-CM | POA: Insufficient documentation

## 2017-06-11 LAB — COMPREHENSIVE METABOLIC PANEL
ALT: 20 U/L (ref 17–63)
ANION GAP: 8 (ref 5–15)
AST: 28 U/L (ref 15–41)
Albumin: 4.9 g/dL (ref 3.5–5.0)
Alkaline Phosphatase: 51 U/L (ref 38–126)
BILIRUBIN TOTAL: 0.6 mg/dL (ref 0.3–1.2)
BUN: 12 mg/dL (ref 6–20)
CALCIUM: 9.7 mg/dL (ref 8.9–10.3)
CO2: 25 mmol/L (ref 22–32)
Chloride: 107 mmol/L (ref 101–111)
Creatinine, Ser: 1.16 mg/dL (ref 0.61–1.24)
Glucose, Bld: 106 mg/dL — ABNORMAL HIGH (ref 65–99)
POTASSIUM: 3.9 mmol/L (ref 3.5–5.1)
SODIUM: 140 mmol/L (ref 135–145)
TOTAL PROTEIN: 7.9 g/dL (ref 6.5–8.1)

## 2017-06-11 LAB — CBC WITH DIFFERENTIAL/PLATELET
Basophils Absolute: 0 10*3/uL (ref 0.0–0.1)
Basophils Relative: 0 %
EOS PCT: 0 %
Eosinophils Absolute: 0 10*3/uL (ref 0.0–0.7)
HEMATOCRIT: 46.5 % (ref 39.0–52.0)
Hemoglobin: 16.5 g/dL (ref 13.0–17.0)
LYMPHS ABS: 1.7 10*3/uL (ref 0.7–4.0)
LYMPHS PCT: 22 %
MCH: 32.1 pg (ref 26.0–34.0)
MCHC: 35.5 g/dL (ref 30.0–36.0)
MCV: 90.5 fL (ref 78.0–100.0)
MONO ABS: 0.5 10*3/uL (ref 0.1–1.0)
Monocytes Relative: 7 %
NEUTROS ABS: 5.2 10*3/uL (ref 1.7–7.7)
Neutrophils Relative %: 71 %
PLATELETS: 232 10*3/uL (ref 150–400)
RBC: 5.14 MIL/uL (ref 4.22–5.81)
RDW: 13.1 % (ref 11.5–15.5)
WBC: 7.4 10*3/uL (ref 4.0–10.5)

## 2017-06-11 LAB — RAPID URINE DRUG SCREEN, HOSP PERFORMED
Amphetamines: NOT DETECTED
BENZODIAZEPINES: NOT DETECTED
Barbiturates: NOT DETECTED
COCAINE: NOT DETECTED
Opiates: NOT DETECTED
Tetrahydrocannabinol: POSITIVE — AB

## 2017-06-11 LAB — ETHANOL

## 2017-06-11 MED ORDER — RISPERIDONE 2 MG PO TABS
2.0000 mg | ORAL_TABLET | Freq: Two times a day (BID) | ORAL | Status: DC
  Administered 2017-06-11: 2 mg via ORAL
  Filled 2017-06-11: qty 1

## 2017-06-11 MED ORDER — CLONIDINE HCL ER 0.1 MG PO TB12
0.1000 mg | ORAL_TABLET | Freq: Every day | ORAL | Status: DC
  Administered 2017-06-12: 0.1 mg via ORAL
  Filled 2017-06-11 (×2): qty 1

## 2017-06-11 MED ORDER — NICOTINE 21 MG/24HR TD PT24: 21.0000 mg | MEDICATED_PATCH | Freq: Every day | TRANSDERMAL | Status: DC | PRN

## 2017-06-11 MED ORDER — LAMOTRIGINE 25 MG PO TABS
75.0000 mg | ORAL_TABLET | Freq: Every day | ORAL | Status: DC
  Administered 2017-06-11 – 2017-06-13 (×3): 75 mg via ORAL
  Filled 2017-06-11 (×3): qty 3

## 2017-06-11 MED ORDER — RISPERIDONE 2 MG PO TABS: 4.0000 mg | ORAL_TABLET | Freq: Every day | ORAL | Status: DC

## 2017-06-11 MED ORDER — BENZTROPINE MESYLATE 1 MG PO TABS: 1.0000 mg | ORAL_TABLET | Freq: Every day | ORAL | Status: DC

## 2017-06-11 MED ORDER — ONDANSETRON HCL 4 MG PO TABS: 4.0000 mg | ORAL_TABLET | Freq: Three times a day (TID) | ORAL | Status: DC | PRN

## 2017-06-11 MED ORDER — TRAZODONE HCL 50 MG PO TABS
150.0000 mg | ORAL_TABLET | Freq: Every evening | ORAL | Status: DC | PRN
  Filled 2017-06-11: qty 1

## 2017-06-11 MED ORDER — RISPERIDONE 1 MG PO TABS: 1.0000 mg | ORAL_TABLET | ORAL | Status: DC

## 2017-06-11 MED ORDER — RISPERIDONE MICROSPHERES 25 MG IM SUSR
25.0000 mg | INTRAMUSCULAR | Status: DC
  Filled 2017-06-11: qty 2

## 2017-06-11 MED ORDER — ALUM & MAG HYDROXIDE-SIMETH 200-200-20 MG/5ML PO SUSP: 30.0000 mL | Freq: Four times a day (QID) | ORAL | Status: DC | PRN

## 2017-06-11 MED ORDER — ACETAMINOPHEN 325 MG PO TABS
650.0000 mg | ORAL_TABLET | ORAL | Status: DC | PRN
  Administered 2017-06-11: 650 mg via ORAL
  Filled 2017-06-11: qty 2

## 2017-06-11 NOTE — ED Notes (Signed)
Patient reports he has a hole in his tooth.  Reports his pain is an 8.  Tylenol given for pain.
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
## 2017-06-11 NOTE — ED Notes (Signed)
Pharmacy tech into see 

## 2017-06-11 NOTE — ED Notes (Signed)
Pt ambulatory to room 34 w/o difficulty, on the phone on arrival

## 2017-06-11 NOTE — ED Notes (Signed)
Pt sleeping at present, awake, alert & responsive, no distress noted.  Monitoring for safety, Q 15 min checks in effect.
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
## 2017-06-11 NOTE — ED Notes (Signed)
Bed: [facility] Expected date:  Expected time:  Means of arrival:  Comments: Room 30

## 2017-06-11 NOTE — ED Notes (Signed)
Patient's belongings placed in locker 30, and patient wanded by security.

## 2017-06-12 DIAGNOSIS — G4700 Insomnia, unspecified: Secondary | ICD-10-CM

## 2017-06-12 DIAGNOSIS — Z79899 Other long term (current) drug therapy: Secondary | ICD-10-CM

## 2017-06-12 DIAGNOSIS — F909 Attention-deficit hyperactivity disorder, unspecified type: Secondary | ICD-10-CM

## 2017-06-12 DIAGNOSIS — F250 Schizoaffective disorder, bipolar type: Secondary | ICD-10-CM

## 2017-06-12 DIAGNOSIS — F1290 Cannabis use, unspecified, uncomplicated: Secondary | ICD-10-CM

## 2017-06-12 DIAGNOSIS — F17210 Nicotine dependence, cigarettes, uncomplicated: Secondary | ICD-10-CM

## 2017-06-12 DIAGNOSIS — Z9114 Patient's other noncompliance with medication regimen: Secondary | ICD-10-CM

## 2017-06-12 MED ORDER — ARIPIPRAZOLE 5 MG PO TABS
5.0000 mg | ORAL_TABLET | Freq: Every day | ORAL | Status: DC
  Administered 2017-06-12: 5 mg via ORAL

## 2017-06-12 MED ORDER — TRAZODONE HCL 50 MG PO TABS
150.0000 mg | ORAL_TABLET | Freq: Every day | ORAL | Status: DC
  Administered 2017-06-12: 150 mg via ORAL
  Filled 2017-06-12: qty 1

## 2017-06-12 MED ORDER — IBUPROFEN 200 MG PO TABS
600.0000 mg | ORAL_TABLET | Freq: Four times a day (QID) | ORAL | Status: DC | PRN
  Administered 2017-06-12 (×2): 600 mg via ORAL
  Filled 2017-06-12 (×2): qty 3

## 2017-06-12 MED ORDER — ARIPIPRAZOLE 5 MG PO TABS
5.0000 mg | ORAL_TABLET | Freq: Every day | ORAL | Status: DC
  Administered 2017-06-13: 5 mg via ORAL
  Filled 2017-06-12 (×2): qty 1

## 2017-06-12 NOTE — Progress Notes (Signed)
06/12/17 1405:  LRT introduced self to pt and offered activities, pt declined.  Caroll RancherMarjette Kajuana Shareef, LRT/CTRS

## 2017-06-12 NOTE — Progress Notes (Signed)
Pt in room awake and holding his jaw.  Pt sts he has tooth ache and is holding his tooth to keep it from hurting more.   Pt sts earlier pain med did give him relief.  Pt is not aggressive and is cooperative and redirectable.  Pt is depressed and guarded and d/t tooth pain is hard to understand.  Pt denies SI, HI and AVH.  Pt verbally contracts for safety. Pt given PRN pain medication and some snacks. Pt is in bed sleeping.  Pt remains safe with 15 min checks.

## 2017-06-12 NOTE — ED Notes (Signed)
Two dentist referral phone numbers given to pt.

## 2017-06-12 NOTE — Consult Note (Signed)
[facility] Psychiatry Consult   Reason for Consult:  Psychiatric evaluation Referring Physician:  EDP Patient Identification: [name] MRN:  [id number] Principal Diagnosis: Schizoaffective disorder, bipolar type [facility]) Diagnosis:   Patient Active Problem List   Diagnosis Date Noted  . Schizoaffective disorder, bipolar type ([place]) [F25.0] 02/26/2017    Priority: Medium  . 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 Cannabis use disorder, severe, dependence ([place]) [F12.20] 10/21/2014    Total Time spent with patient: 45 minutes  Subjective:   [name] is a 21 y.o. male patient admitted with aggressive behavior and psychosis.  HPI:  Patient with history of Schizoaffective disorder-Bipolar, ADHD and Cannabis use disorder. Patient was IVC'd by his mother due to worsening aggressive behavior, psychosis and property destruction. Patient reports that he is non-compliant with Risperdal because it makes him ''sick'', he prefers Abilify instead. However, he has been self medicating with Alcohol and Cannabis. Patient denies being aggressive or assaultive but he acknowledged being argumentative with his mother. He denies SI/HI and delusional thinking.   Past Psychiatric History: as above  Risk to Self: Suicidal Ideation: No Suicidal Intent: No Is patient at risk for suicide?: No Suicidal Plan?: No Access to Means: No What has been your use of drugs/alcohol within the last 12 months?:  (thc and alchol occasional use ) How many times?: 0 Other Self Harm Risks:  (denies self harm ) Triggers for Past Attempts: Other (Comment) (no prior attempts and/or gestures ) Intentional Self Injurious Behavior: None Risk to Others: Homicidal Ideation: No Thoughts of Harm to Others: No Current Homicidal Intent: No Current Homicidal Plan: No Access to Homicidal Means: No Identified Victim:  (n/a) History of harm to  others?: No Assessment of Violence: None Noted Violent Behavior Description:  (patient is calm and cooperative ) Does patient have access to weapons?: No Criminal Charges Pending?: No Describe Pending Criminal Charges:  (patient denies ) Does patient have a court date: No Prior Inpatient Therapy: Prior Inpatient Therapy: Yes Prior Therapy Dates:  ([facility], [facility], [facility]-dates unknown) Prior Therapy Facilty/Provider(s):  ([place], [place], Medical sales representative) Reason for Treatment:  (1-2 yrs of treatment ) Prior Outpatient Therapy: Prior Outpatient Therapy: No Prior Therapy Dates:  (n/a) Prior Therapy Facilty/Provider(s):  (n/a) Reason for Treatment:  (n/a) Does patient have an ACCT team?: No Does patient have Intensive In-House Services?  : No Does patient have [facility] services? : No Does patient have P4CC services?: No  Past Medical History:  Past Medical History:  Diagnosis Date  . ADHD (attention deficit hyperactivity disorder)   . Schizophrenia ([place])    No past surgical history on file. Family History:  Family History  Problem Relation Age of Onset  . Bipolar disorder Mother   . Diabetes Mother   . Hypertension Mother    Family Psychiatric  History:  Social History:  History  Alcohol Use No     History  Drug Use  . Types: Marijuana    Comment: everyday    Social History   Social History  . Marital status: Single    Spouse name: N/A  . Number of children: N/A  . Years of education: N/A   Social History Main Topics  . Smoking status: Current Every Day Smoker    Packs/day: 0.25    Types: Cigarettes  . Smokeless tobacco: Never Used  . Alcohol use No  . Drug use: Yes    Types: Marijuana     Comment:  everyday  . Sexual activity: Yes    Birth control/ protection: Condom   Other Topics Concern  . Not on file   Social History Narrative  . No narrative on file   Additional Social History:    Allergies:   Allergies  Allergen Reactions  . Morphine And Related Hives     Per mother     Labs:  Results for orders placed or performed during the hospital encounter of 06/11/17 (from the past 48 hour(s))  Urine rapid drug screen (hosp performed)     Status: Abnormal   Collection Time: 06/11/17  2:06 PM  Result Value Ref Range   Opiates NONE DETECTED NONE DETECTED   Cocaine NONE DETECTED NONE DETECTED   Benzodiazepines NONE DETECTED NONE DETECTED   Amphetamines NONE DETECTED NONE DETECTED   Tetrahydrocannabinol POSITIVE (A) NONE DETECTED   Barbiturates NONE DETECTED NONE DETECTED    Comment:        DRUG SCREEN FOR MEDICAL PURPOSES ONLY.  IF CONFIRMATION IS NEEDED FOR ANY PURPOSE, NOTIFY LAB WITHIN 5 DAYS.        LOWEST DETECTABLE LIMITS FOR URINE DRUG SCREEN Drug Class       Cutoff (ng/mL) Amphetamine      1000 Barbiturate      200 Benzodiazepine   193 Tricyclics       790 Opiates          300 Cocaine          300 THC              50   Comprehensive metabolic panel     Status: Abnormal   Collection Time: 06/11/17  2:27 PM  Result Value Ref Range   Sodium 140 135 - 145 mmol/L   Potassium 3.9 3.5 - 5.1 mmol/L   Chloride 107 101 - 111 mmol/L   CO2 25 22 - 32 mmol/L   Glucose, Bld 106 (H) 65 - 99 mg/dL   BUN 12 6 - 20 mg/dL   Creatinine, Ser 1.16 0.61 - 1.24 mg/dL   Calcium 9.7 8.9 - 10.3 mg/dL   Total Protein 7.9 6.5 - 8.1 g/dL   Albumin 4.9 3.5 - 5.0 g/dL   AST 28 15 - 41 U/L   ALT 20 17 - 63 U/L   Alkaline Phosphatase 51 38 - 126 U/L   Total Bilirubin 0.6 0.3 - 1.2 mg/dL   GFR calc non Af Amer >60 >60 mL/min   GFR calc Af Amer >60 >60 mL/min    Comment: (NOTE) The eGFR has been calculated using the CKD EPI equation. This calculation has not been validated in all clinical situations. eGFR's persistently <60 mL/min signify possible Chronic Kidney Disease.    Anion gap 8 5 - 15  Ethanol     Status: None   Collection Time: 06/11/17  2:27 PM  Result Value Ref Range   Alcohol, Ethyl (B) <5 <5 mg/dL    Comment:        LOWEST DETECTABLE  LIMIT FOR SERUM ALCOHOL IS 5 mg/dL FOR MEDICAL PURPOSES ONLY   CBC with Diff     Status: None   Collection Time: 06/11/17  2:27 PM  Result Value Ref Range   WBC 7.4 4.0 - 10.5 K/uL   RBC 5.14 4.22 - 5.81 MIL/uL   Hemoglobin 16.5 13.0 - 17.0 g/dL   HCT 46.5 39.0 - 52.0 %   MCV 90.5 78.0 - 100.0 fL   MCH 32.1 26.0 - 34.0 pg  MCHC 35.5 30.0 - 36.0 g/dL   RDW 13.1 11.5 - 15.5 %   Platelets 232 150 - 400 K/uL   Neutrophils Relative % 71 %   Neutro Abs 5.2 1.7 - 7.7 K/uL   Lymphocytes Relative 22 %   Lymphs Abs 1.7 0.7 - 4.0 K/uL   Monocytes Relative 7 %   Monocytes Absolute 0.5 0.1 - 1.0 K/uL   Eosinophils Relative 0 %   Eosinophils Absolute 0.0 0.0 - 0.7 K/uL   Basophils Relative 0 %   Basophils Absolute 0.0 0.0 - 0.1 K/uL    Current Facility-Administered Medications  Medication Dose Route Frequency Provider Last Rate Last Dose  . alum & mag hydroxide-simeth (MAALOX/MYLANTA) 200-200-20 MG/5ML suspension 30 mL  30 mL Oral Q6H PRN Harris, Abigail, PA-C      . [START ON 06/13/2017] ARIPiprazole (ABILIFY) tablet 5 mg  5 mg Oral Daily Lita Flynn, MD      . cloNIDine HCl (KAPVAY) ER tablet 0.1 mg  0.1 mg Oral QHS Harris, Abigail, PA-C      . ibuprofen (ADVIL,MOTRIN) tablet 600 mg  600 mg Oral Q6H PRN Eain Mullendore, MD   600 mg at 06/12/17 1020  . lamoTRIgine (LAMICTAL) tablet 75 mg  75 mg Oral Daily Giovannina Mun, MD   75 mg at 06/12/17 1020  . nicotine (NICODERM CQ - dosed in mg/24 hours) patch 21 mg  21 mg Transdermal Daily PRN Harris, Abigail, PA-C      . ondansetron (ZOFRAN) tablet 4 mg  4 mg Oral Q8H PRN Harris, Abigail, PA-C      . traZODone (DESYREL) tablet 150 mg  150 mg Oral QHS Corena Pilgrim, MD       Current Outpatient Prescriptions  Medication Sig Dispense Refill  . cloNIDine HCl (KAPVAY) 0.1 MG TB12 ER tablet Take 1 tablet (0.1 mg total) by mouth at bedtime. 30 tablet 0  . risperiDONE microspheres (RISPERDAL CONSTA) 25 MG injection Inject 2 mLs (25 mg  total) into the muscle every 14 (fourteen) days. Next dose due 03/19/17 1 each 0  . lamoTRIgine (LAMICTAL) 25 MG tablet Take 3 tablets (75 mg total) by mouth daily. (Patient taking differently: Take 50 mg by mouth daily. ) 90 tablet 0    Musculoskeletal: Strength & Muscle Tone: within normal limits Gait & Station: normal Patient leans: N/A  Psychiatric Specialty Exam: Physical Exam  Psychiatric: His speech is normal. Thought content normal. His affect is labile. He is agitated, aggressive and actively hallucinating. Cognition and memory are normal. He expresses impulsivity.    Review of Systems  Constitutional: Negative.   HENT: Negative.   Eyes: Negative.   Respiratory: Negative.   Cardiovascular: Negative.   Gastrointestinal: Negative.   Skin: Negative.   Neurological: Negative.   Endo/Heme/Allergies: Negative.   Psychiatric/Behavioral: Positive for hallucinations and substance abuse.    Blood pressure 100/60, pulse 61, temperature 98.3 F (36.8 C), temperature source Oral, resp. rate 16, SpO2 97 %.There is no height or weight on file to calculate BMI.  General Appearance: Casual  Eye Contact:  Good  Speech:  Pressured  Volume:  Normal  Mood:  Irritable  Affect:  Labile  Thought Process:  Coherent and Descriptions of Associations: Intact  Orientation:  Full (Time, Place, and Person)  Thought Content:  Hallucinations: Auditory  Suicidal Thoughts:  No  Homicidal Thoughts:  No  Memory:  Immediate;   Good Recent;   Good Remote;   Good  Judgement:  Poor  Insight:  Shallow  Psychomotor Activity:  Increased  Concentration:  Concentration: Fair and Attention Span: Fair  Recall:  [employer] of Knowledge:  Fair  Language:  Good  Akathisia:  No  Handed:  Right  AIMS (if indicated):     Assets:  Communication Skills Desire for Improvement Social Support  ADL's:  Intact  Cognition:  WNL  Sleep:   Poor     Treatment Plan Summary: Daily contact with patient to assess and  evaluate symptoms and progress in treatment and Medication management  Continue Lamictal 2m daily for mood, Trazodone 150 mg qhs for insomnia. Discontinue Risperdal-patient not taking it. Start Abilify 5 mg daily for psychosis.  Disposition: Recommend psychiatric Inpatient admission when medically cleared.  ACorena Pilgrim MD 06/12/2017 10:39 AM

## 2017-06-13 ENCOUNTER — Inpatient Hospital Stay (HOSPITAL_COMMUNITY)
Admission: AD | Admit: 2017-06-13 | Discharge: 2017-06-20 | DRG: 885 | Disposition: A | Discharge status: Home / Self Care | Payer: Medicaid Other | Attending: Psychiatry | Admitting: Psychiatry

## 2017-06-13 ENCOUNTER — Encounter (HOSPITAL_COMMUNITY): Payer: Self-pay | Admitting: *Deleted

## 2017-06-13 DIAGNOSIS — Z885 Allergy status to narcotic agent status: Secondary | ICD-10-CM

## 2017-06-13 DIAGNOSIS — Z818 Family history of other mental and behavioral disorders: Secondary | ICD-10-CM

## 2017-06-13 DIAGNOSIS — G4700 Insomnia, unspecified: Secondary | ICD-10-CM | POA: Diagnosis present

## 2017-06-13 DIAGNOSIS — F12251 Cannabis dependence with psychotic disorder with hallucinations: Secondary | ICD-10-CM | POA: Diagnosis present

## 2017-06-13 DIAGNOSIS — F17210 Nicotine dependence, cigarettes, uncomplicated: Secondary | ICD-10-CM | POA: Diagnosis present

## 2017-06-13 DIAGNOSIS — F909 Attention-deficit hyperactivity disorder, unspecified type: Secondary | ICD-10-CM | POA: Diagnosis present

## 2017-06-13 DIAGNOSIS — F419 Anxiety disorder, unspecified: Secondary | ICD-10-CM | POA: Diagnosis present

## 2017-06-13 DIAGNOSIS — F250 Schizoaffective disorder, bipolar type: Secondary | ICD-10-CM | POA: Diagnosis present

## 2017-06-13 DIAGNOSIS — Z79899 Other long term (current) drug therapy: Secondary | ICD-10-CM

## 2017-06-13 MED ORDER — CLONIDINE HCL ER 0.1 MG PO TB12
0.1000 mg | ORAL_TABLET | Freq: Every day | ORAL | Status: DC
  Administered 2017-06-13 – 2017-06-19 (×7): 0.1 mg via ORAL
  Filled 2017-06-13 (×3): qty 1
  Filled 2017-06-13: qty 7
  Filled 2017-06-13 (×6): qty 1

## 2017-06-13 MED ORDER — IBUPROFEN 600 MG PO TABS
600.0000 mg | ORAL_TABLET | Freq: Four times a day (QID) | ORAL | Status: DC | PRN
  Administered 2017-06-16 – 2017-06-17 (×2): 600 mg via ORAL
  Filled 2017-06-13 (×2): qty 1

## 2017-06-13 MED ORDER — ALUM & MAG HYDROXIDE-SIMETH 200-200-20 MG/5ML PO SUSP: 30.0000 mL | Freq: Four times a day (QID) | ORAL | Status: DC | PRN

## 2017-06-13 MED ORDER — ACETAMINOPHEN 325 MG PO TABS
650.0000 mg | ORAL_TABLET | Freq: Four times a day (QID) | ORAL | Status: DC | PRN
Start: 2017-06-13 — End: 2017-06-20
  Administered 2017-06-15: 650 mg via ORAL
  Filled 2017-06-13: qty 2

## 2017-06-13 MED ORDER — TRAZODONE HCL 150 MG PO TABS
150.0000 mg | ORAL_TABLET | Freq: Every day | ORAL | Status: DC
  Administered 2017-06-13 – 2017-06-15 (×3): 150 mg via ORAL
  Filled 2017-06-13 (×5): qty 1

## 2017-06-13 MED ORDER — ONDANSETRON HCL 4 MG PO TABS: 4.0000 mg | ORAL_TABLET | Freq: Three times a day (TID) | ORAL | Status: DC | PRN

## 2017-06-13 MED ORDER — MAGNESIUM HYDROXIDE 400 MG/5ML PO SUSP: 30.0000 mL | Freq: Every day | ORAL | Status: DC | PRN

## 2017-06-13 MED ORDER — NICOTINE 21 MG/24HR TD PT24: 21.0000 mg | MEDICATED_PATCH | Freq: Every day | TRANSDERMAL | Status: DC | PRN

## 2017-06-13 MED ORDER — ARIPIPRAZOLE 5 MG PO TABS
5.0000 mg | ORAL_TABLET | Freq: Every day | ORAL | Status: DC
  Administered 2017-06-14: 5 mg via ORAL
  Filled 2017-06-13 (×3): qty 1

## 2017-06-13 MED ORDER — LAMOTRIGINE 25 MG PO TABS
75.0000 mg | ORAL_TABLET | Freq: Every day | ORAL | Status: DC
  Administered 2017-06-14 – 2017-06-20 (×7): 75 mg via ORAL
  Filled 2017-06-13 (×6): qty 3
  Filled 2017-06-13: qty 21
  Filled 2017-06-13 (×2): qty 3

## 2017-06-13 NOTE — Tx Team (Signed)
Initial Treatment Plan 06/13/2017 5:25 PM [name] ZOX:[id number]    PATIENT STRESSORS: Marital or family conflict Medication change or noncompliance   PATIENT STRENGTHS: Ability for insight Average or above average intelligence Capable of independent living General fund of knowledge   PATIENT IDENTIFIED PROBLEMS: Psychosis Agitation "I don't know they just came to me and said I had to come here" "I'll try the new medicine"                     DISCHARGE CRITERIA:  Ability to meet basic life and health needs Improved stabilization in mood, thinking, and/or behavior Verbal commitment to aftercare and medication compliance  PRELIMINARY DISCHARGE PLAN: Attend aftercare/continuing care group Return to previous living arrangement  PATIENT/FAMILY INVOLVEMENT: This treatment plan has been presented to and reviewed with the patient, [name], and/or family member, .  The patient and family have been given the opportunity to ask questions and make suggestions.  [name], [place], [place]RN 06/13/2017, 5:25 PM

## 2017-06-13 NOTE — Progress Notes (Signed)
[name] is a [age] year olJonny [name] male pt admitted on involuntary basis. On admission, [name] is covering his mouth and says he does this because he has a bad tooth. He showed staff the tooth he complains about and denies any pain with this tooth at this time. He reports he does not know why he is here. He denies having any conflict with his mother, and he denies any substance abuse issues and reports that he only smoked some marijuana at a party a couple of weeks ago. He did report that he has not been taking his medications and reports that it gives him mood swings. He is agreeable to taking some new medications while he is here. He reports that he lives with his brother and will be able to go back there after discharge. He denied any SI/HI and is able to contract for safety while in the hospital. [name] was oriented to the milieu and safety maintained.

## 2017-06-13 NOTE — Progress Notes (Signed)
Psychoeducational Group Note  Date:  06/13/2017 Time: 2117  Group Topic/Focus:  Wrap-Up Group:   The focus of this group is to help patients review their daily goal of treatment and discuss progress on daily workbooks.  Participation Level: Did Not Attend  Participation Quality:  Not Applicable  Affect:  Not Applicable  Cognitive:  Not Applicable  Insight:  Not Applicable  Engagement in Group: Not Applicable  Additional Comments:  The patient did not attend group this evening.   Hazle CocaGOODMAN, Khamarion Bjelland S 06/13/2017, 9:16 PM

## 2017-06-13 NOTE — Progress Notes (Signed)
Patient ID: Zachary LeavensJohn C [name], male   DOB: [birth year], 21 y.o.   MRN: [id number]  Pt currently presents with a flat affect and isolative behavior. Pt remains in his room tonight, chooses not to attend group. Pt has no initiation with Clinical research associatewriter. Pt reports to Clinical research associatewriter that he was living with his brother and that his dad "doesn't want me to live with him yet." Pt endorses ongoing pain in his R lower tooth. Pt was complaining of this pain months ago during his last admission. Pt reports good sleep with current medication regimen.   Pt provided with medications per providers orders. Pt's labs and vitals were monitored throughout the night. Pt given a 1:1 about emotional and mental status. Pt supported and encouraged to express concerns and questions. Pt educated on medications.  Pt's safety ensured with 15 minute and environmental checks. Pt currently denies SI/HI and A/V hallucinations. Pt verbally agrees to seek staff if SI/HI or A/VH occurs and to consult with staff before acting on any harmful thoughts. Will continue POC.

## 2017-06-13 NOTE — ED Notes (Signed)
Pt was calm all morning.  After MD rounds pt used telephone  And became very agitated and loud and beat phone against receiver and began cursing and slamming doors.  Security was called and was able to verbally deescalate patient at that time.

## 2017-06-13 NOTE — BH Assessment (Signed)
[facility] Assessment Progress Note  Per [name], MD, this pt requires psychiatric hospitalization.  [name], RN, [id number]; they will be ready to receive pt at 16:30.  Pt presents under IVC initiated by pt's mother, and upheld by Dr Jannifer FranklinAkintayo, and [facility].  Pt's nurse, [name], has been notified, and agrees to call report to [phone].  Pt is to be transported via [employer] examinerlaw enforcement.   [name], MA Triage Specialist [phone]

## 2017-06-13 NOTE — Consult Note (Signed)
[facility]   Reason for Consult:  Psychiatric evaluation Referring Physician:  EDP Patient Identification: Zachary LeavensJohn C [name] MRN:  [id number] Principal Diagnosis: Schizoaffective disorder, bipolar type [facility]) Diagnosis:   Patient Active Problem List   Diagnosis Date Noted  . Schizoaffective disorder, bipolar type ([facility]) [F25.0] 02/26/2017    Priority: High  . 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 Cannabis use disorder, severe, dependence ([facility]) [F12.20] 10/21/2014    Total Time spent with patient: 30 minutes  Subjective:   [name] is a 21 y.o. male patient admitted with aggressive behavior and psychosis.  HPI:  Patient with history of Schizoaffective disorder-Bipolar, ADHD and Cannabis use disorder. Patient was IVC'd by his mother due to worsening aggressive behavior, psychosis and property destruction. Patient reports that he is non-compliant with Risperdal because it makes him ''sick'', he prefers Abilify instead. However, he has been self medicating with Alcohol and Cannabis. Patient denies being aggressive or assaultive but he acknowledged being argumentative with his mother. He denies SI/HI and delusional thinking.   The patient was started on Abilify yesterday as discharge was a possibility today but he became very upset with his mother on the phone who is lives with.  Too labile at this time, inpatient treatment needed as stabilization did not occur and needs more time.  Past Psychiatric History: as above  Risk to Self: Suicidal Ideation: No Suicidal Intent: No Is patient at risk for suicide?: No Suicidal Plan?: No Access to Means: No What has been your use of drugs/alcohol within the last 12 months?:  (thc and alchol occasional use ) How many times?: 0 Other Self Harm Risks:  (denies self harm ) Triggers for Past Attempts: Other (Comment) (no prior  attempts and/or gestures ) Intentional Self Injurious Behavior: None Risk to Others: Homicidal Ideation: No Thoughts of Harm to Others: No Current Homicidal Intent: No Current Homicidal Plan: No Access to Homicidal Means: No Identified Victim:  (n/a) History of harm to others?: No Assessment of Violence: None Noted Violent Behavior Description:  (patient is calm and cooperative ) Does patient have access to weapons?: No Criminal Charges Pending?: No Describe Pending Criminal Charges:  (patient denies ) Does patient have a court date: No Prior Inpatient Therapy: Prior Inpatient Therapy: Yes Prior Therapy Dates:  ([facility], [facility], [facility]-dates unknown) Prior Therapy Facilty/Provider(s):  ([facility], [facility]) Reason for Treatment:  (1-2 yrs of treatment ) Prior Outpatient Therapy: Prior Outpatient Therapy: No Prior Therapy Dates:  (n/a) Prior Therapy Facilty/Provider(s):  (n/a) Reason for Treatment:  (n/a) Does patient have an ACCT team?: No Does patient have Intensive In-House Services?  : No Does patient have [facility] services? : No Does patient have P4CC services?: No  Past Medical History:  Past Medical History:  Diagnosis Date  . ADHD (attention deficit hyperactivity disorder)   . Schizophrenia ([facility])    No past surgical history on file. Family History:  Family History  Problem Relation Age of Onset  . Bipolar disorder Mother   . Diabetes Mother   . Hypertension Mother    Family Psychiatric  History:  Social History:  History  Alcohol Use No     History  Drug Use  . Types: Marijuana    Comment: everyday    Social History   Social History  . Marital status: Single    Spouse name: N/A  . Number of children: N/A  . Years of  education: N/A   Social History Main Topics  . Smoking status: Current Every Day Smoker    Packs/day: 0.25    Types: Cigarettes  . Smokeless tobacco: Never Used  . Alcohol use No  . Drug use: Yes    Types: Marijuana     Comment:  everyday  . Sexual activity: Yes    Birth control/ protection: Condom   Other Topics Concern  . Not on file   Social History Narrative  . No narrative on file   Additional Social History:    Allergies:   Allergies  Allergen Reactions  . Morphine And Related Hives    Per mother     Labs:  No results found for this or any previous visit (from the past 48 hour(s)).  Current Facility-Administered Medications  Medication Dose Route Frequency Provider Last Rate Last Dose  . alum & mag hydroxide-simeth (MAALOX/MYLANTA) 200-200-20 MG/5ML suspension 30 mL  30 mL Oral Q6H PRN Harris, Abigail, PA-C      . ARIPiprazole (ABILIFY) tablet 5 mg  5 mg Oral Daily Sharnice Bosler, MD   5 mg at 06/13/17 1037  . cloNIDine HCl (KAPVAY) ER tablet 0.1 mg  0.1 mg Oral QHS Harris, Abigail, PA-C   0.1 mg at 06/12/17 2158  . ibuprofen (ADVIL,MOTRIN) tablet 600 mg  600 mg Oral Q6H PRN Thedore Mins, MD   600 mg at 06/12/17 2158  . lamoTRIgine (LAMICTAL) tablet 75 mg  75 mg Oral Daily Linwood Gullikson, MD   75 mg at 06/13/17 1038  . nicotine (NICODERM CQ - dosed in mg/24 hours) patch 21 mg  21 mg Transdermal Daily PRN Harris, Abigail, PA-C      . ondansetron (ZOFRAN) tablet 4 mg  4 mg Oral Q8H PRN Harris, Abigail, PA-C      . traZODone (DESYREL) tablet 150 mg  150 mg Oral QHS Thedore Mins, MD   150 mg at 06/12/17 2158   Current Outpatient Prescriptions  Medication Sig Dispense Refill  . cloNIDine HCl (KAPVAY) 0.1 MG TB12 ER tablet Take 1 tablet (0.1 mg total) by mouth at bedtime. 30 tablet 0  . risperiDONE microspheres (RISPERDAL CONSTA) 25 MG injection Inject 2 mLs (25 mg total) into the muscle every 14 (fourteen) days. Next dose due 03/19/17 1 each 0  . lamoTRIgine (LAMICTAL) 25 MG tablet Take 3 tablets (75 mg total) by mouth daily. (Patient taking differently: Take 50 mg by mouth daily. ) 90 tablet 0    Musculoskeletal: Strength & Muscle Tone: within normal limits Gait & Station:  normal Patient leans: N/A  Psychiatric Specialty Exam: Physical Exam  Constitutional: He is oriented to person, place, and time. He appears well-developed and well-nourished.  HENT:  Head: Normocephalic.  Respiratory: Effort normal.  Musculoskeletal: Normal range of motion.  Neurological: He is alert and oriented to person, place, and time.  Psychiatric: His speech is normal. Thought content normal. His affect is labile. He is agitated, aggressive and actively hallucinating. Cognition and memory are normal. He expresses impulsivity.    Review of Systems  Constitutional: Negative.   HENT: Negative.   Eyes: Negative.   Respiratory: Negative.   Cardiovascular: Negative.   Gastrointestinal: Negative.   Skin: Negative.   Neurological: Negative.   Endo/Heme/Allergies: Negative.   Psychiatric/Behavioral: Positive for hallucinations and substance abuse.    Blood pressure 120/78, pulse 65, temperature 98.1 F (36.7 C), temperature source Oral, resp. rate 18, SpO2 99 %.There is no height or weight on file to calculate BMI.  General Appearance: Casual  Eye Contact:  Good  Speech:  Pressured  Volume:  Normal  Mood:  Irritable  Affect:  Labile  Thought Process:  Coherent and Descriptions of Associations: Intact  Orientation:  Full (Time, Place, and Person)  Thought Content:  Hallucinations: Auditory  Suicidal Thoughts:  No  Homicidal Thoughts:  No  Memory:  Immediate;   Good Recent;   Good Remote;   Good  Judgement:  Poor  Insight:  Shallow  Psychomotor Activity:  Increased  Concentration:  Concentration: Fair and Attention Span: Fair  Recall:  [employer] of Knowledge:  Fair  Language:  Good  Akathisia:  No  Handed:  Right  AIMS (if indicated):     Assets:  Communication Skills Desire for Improvement Social Support  ADL's:  Intact  Cognition:  WNL  Sleep:   Poor     Treatment Plan Summary: Daily contact with patient to assess and evaluate symptoms and progress in  treatment and Medication management schizoaffective disorder, bipolar type Continue Lamictal 75mg  daily for mood, Trazodone 150 mg qhs for insomnia. Discontinue Risperdal-patient not taking it. Start Abilify 5 mg daily for psychosis.  Disposition: Recommend psychiatric Inpatient admission when medically cleared.  Nanine Means, NP 06/13/2017 3:23 PM Patient seen face-to-face for psychiatric evaluation, chart reviewed and case discussed with the physician extender and developed treatment plan. Reviewed the information documented and agree with the treatment plan. Thedore Mins, MD

## 2017-06-13 NOTE — ED Notes (Signed)
Pt discharged safely with [facility] Pt was calm and cooperative at transfer.  All belongings were sent with patient. 

## 2017-06-13 NOTE — Progress Notes (Signed)
06/13/17 1333:  LRT informed by staff to let pt remain sleep.   Caroll RancherMarjette Erric Machnik, LRT/CTRS

## 2017-06-14 DIAGNOSIS — F250 Schizoaffective disorder, bipolar type: Principal | ICD-10-CM

## 2017-06-14 DIAGNOSIS — G4700 Insomnia, unspecified: Secondary | ICD-10-CM

## 2017-06-14 DIAGNOSIS — Z818 Family history of other mental and behavioral disorders: Secondary | ICD-10-CM

## 2017-06-14 DIAGNOSIS — R443 Hallucinations, unspecified: Secondary | ICD-10-CM

## 2017-06-14 DIAGNOSIS — F1910 Other psychoactive substance abuse, uncomplicated: Secondary | ICD-10-CM

## 2017-06-14 DIAGNOSIS — F1220 Cannabis dependence, uncomplicated: Secondary | ICD-10-CM

## 2017-06-14 DIAGNOSIS — F17210 Nicotine dependence, cigarettes, uncomplicated: Secondary | ICD-10-CM

## 2017-06-14 MED ORDER — ARIPIPRAZOLE 5 MG PO TABS
10.0000 mg | ORAL_TABLET | Freq: Every day | ORAL | Status: DC
  Administered 2017-06-15 – 2017-06-20 (×6): 10 mg via ORAL
  Filled 2017-06-14 (×7): qty 1
  Filled 2017-06-14: qty 21

## 2017-06-14 MED ORDER — BENZOCAINE 10 % MT GEL
Freq: Four times a day (QID) | OROMUCOSAL | Status: DC | PRN
  Administered 2017-06-16: 21:00:00 via OROMUCOSAL

## 2017-06-14 NOTE — Plan of Care (Signed)
Problem: Health Behavior/Discharge Planning: Goal: Compliance with prescribed medication regimen will improve Outcome: Progressing Pt adheres to medication regimen today  06/13/2017

## 2017-06-14 NOTE — Progress Notes (Signed)
Isolative in room most of shift. Attended SW group but refused nurse group. Went to meals and recreation. Minimal interaction with peers and staff, slowly responds to staff questions directly and without elaboration. Encouraged to participate. Remains withdrawn, wrapped in blanket with hand covering mouth.

## 2017-06-14 NOTE — [facility] Group Notes (Signed)
  [facility] LCSW Group Therapy Note  Date/Time:  06/14/2017 11:15AM-12:00PM  Type of Therapy and Topic:  Group Therapy:  Feelings About Hospitalization  Participation Level:  Minimal   Description of Group This process group involved patients discussing their feelings related to being hospitalized, as well as the benefits they see to being in the hospital.  These feelings and benefits were itemized.  The group then brainstormed specific ways in which they could seek those same benefits when they discharge and return home.  Therapeutic Goals 1. Patient will identify and describe positive and negative feelings related to hospitalization 2. Patient will verbalize benefits of hospitalization to themselves personally 3. Patients will brainstorm together ways they can obtain similar benefits in the outpatient setting, identify barriers to wellness and possible solutions  Summary of Patient Progress:  The patient expressed his primary feelings about being hospitalized are "I feel like leaving."  He spoke with his blanket over his mouth, was difficult to understand.  Others explained to CSW that he has a cavity and it hurts him to get cold air on the tooth.  He eventually left group without talking anymore, and did not return.  Therapeutic Modalities Cognitive Behavioral Therapy Motivational Interviewing    Ambrose MantleMareida Grossman-Orr, LCSW 06/14/2017, 1:08 PM

## 2017-06-14 NOTE — [facility] Suicide Risk Assessment (Addendum)
[facility] INPATIENT:  Family/Significant Other Suicide Prevention Education  Suicide Prevention Education:  Patient Refusal for Family/Significant Other Suicide Prevention Education: The patient Zachary LeavensJohn C [name] has refused to provide written consent for family/significant other to be provided Family/Significant Other Suicide Prevention Education during admission and/or prior to discharge.  Physician notified.  There was no SI at the time of admission.  Pt declined any contact with his family.  Spoke with mother Ms Little with patient.  She wanted reassurance from him that he was willing to [facility], that he would go to appointments and he would take meds.  When she accused him of being disrespectful of her, and told him she would have him IVC'd for more of the same, he accused her of the same, and the conversation went down hill from there.  He did ultimately reluctantly agree to comply with her rules.  She stated she would allow him to come home, and gave us permission to send him home by bus.  Carloyn JaegerMareida J Grossman-Orr 06/14/2017, 2:12 PM
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
## 2017-06-14 NOTE — Progress Notes (Signed)
D: Pt was pacing in hallway upon initial approach.  Pt presents with preoccupied affect and mood.  Forwards little information to Clinical research associatewriter.  He discussed how he would like to start his "ADHD medication."  Pt has his hand over his mouth even when he is speaking to Clinical research associatewriter.  His goal is "to get out."  He reports the best part of his day was "going to group."  Pt denies SI/HI, denies hallucinations, denies pain.  Pt has been visible in milieu with few peer interactions.  Pt attended evening group.    A: Introduced self to pt.  Actively listened to pt and offered support and encouragement. Medications administered per order.  Q15 minute safety checks maintained.  R: Pt is safe on the unit.  Pt is compliant with medications.  Pt verbally contracts for safety.  Will continue to monitor and assess.
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
## 2017-06-14 NOTE — [facility] Group Notes (Signed)
Adult Psychoeducational Group Note  Date:  06/14/2017 Time: 8:00pm Group Topic/Focus:  Wrap-Up Group:   The focus of this group is to help patients review their daily goal of treatment and discuss progress on daily workbooks.  Participation Level:  Active  Participation Quality:  Appropriate and Attentive  Affect:  Appropriate  Cognitive:  Alert and Appropriate  Insight: Appropriate  Engagement in Group:  Engaged  Modes of Intervention:  Discussion  Additional Comments:  Pt was share that today was a good day and that he is worried about his adhd medication. Pt was able to sleep and get proper rest.   April MansonScott, Raquel Sayres D 06/14/2017, 9:49 PM

## 2017-06-14 NOTE — Plan of Care (Signed)
Problem: Safety: Goal: Periods of time without injury will increase Outcome: Progressing Pt has not harmed self or others tonight.  He denies SI/HI and verbally contracts for safety.   
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
## 2017-06-14 NOTE — [facility] Group Notes (Signed)
Patient did not attend nurse psychoeducational group.

## 2017-06-15 DIAGNOSIS — F419 Anxiety disorder, unspecified: Secondary | ICD-10-CM

## 2017-06-15 DIAGNOSIS — R450 Nervousness: Secondary | ICD-10-CM

## 2017-06-15 DIAGNOSIS — F39 Unspecified mood [affective] disorder: Secondary | ICD-10-CM

## 2017-06-15 DIAGNOSIS — F1290 Cannabis use, unspecified, uncomplicated: Secondary | ICD-10-CM

## 2017-06-15 NOTE — Progress Notes (Signed)
D: Pt was in the hallway upon initial approach.  Presents with preoccupied affect and mood.  Forwards little information and is difficult to understand because he covers his mouth even when he is talking.  His goal was "going to groups" and he "went to some."  Denies SI/HI, hallucinations, and pain.  Did not attend evening group.    A: Met with pt and offered support and encouragement.  Medication administered per order.  Q15 minute safety checks maintained.  R: Pt is compliant with medications.  He verbally contracts for safety.  Will continue to monitor and assess.
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
## 2017-06-15 NOTE — Plan of Care (Signed)
Problem: Activity: Goal: Sleeping patterns will improve Outcome: Not Progressing Pt only slept 1.25 hours last night.

## 2017-06-15 NOTE — Progress Notes (Signed)
Patient did not attend group.

## 2017-06-15 NOTE — [facility] Group Notes (Signed)
[facility] LCSW Group Therapy Note  Date/Time:  06/15/2017  11:00AM-12:00PM  Type of Therapy and Topic:  Group Therapy:  Music and Mood  Participation Level:  Did Not Attend   Description of Group: In this process group, members listened to a variety of genres of music and identified that different types of music evoke different responses.  Patients were encouraged to identify music that was soothing for them and music that was energizing for them.  Patients discussed how this knowledge can help with wellness and recovery in various ways including managing depression and anxiety as well as encouraging healthy sleep habits.    Therapeutic Goals: 1. Patients will explore the impact of different varieties of music on mood 2. Patients will verbalize the thoughts they have when listening to different types of music 3. Patients will identify music that is soothing to them as well as music that is energizing to them 4. Patients will discuss how to use this knowledge to assist in maintaining wellness and recovery 5. Patients will explore the use of music as a coping skill  Summary of Patient Progress:  N/A  Therapeutic Modalities: Solution Focused Brief Therapy Motivational Interviewing Activity   Ambrose MantleMareida Grossman-Orr, LCSW 06/15/2017 1:00 PM

## 2017-06-16 DIAGNOSIS — F29 Unspecified psychosis not due to a substance or known physiological condition: Secondary | ICD-10-CM

## 2017-06-16 LAB — LIPID PANEL
Cholesterol: 156 mg/dL (ref 0–200)
HDL: 47 mg/dL (ref >40)
LDL Cholesterol: 94 mg/dL (ref 0–99)
TRIGLYCERIDES: 75 mg/dL (ref <150)
Total CHOL/HDL Ratio: 3.3 RATIO
VLDL: 15 mg/dL (ref 0–40)

## 2017-06-16 LAB — HEMOGLOBIN A1C
Hgb A1c MFr Bld: 5.4 % (ref 4.8–5.6)
MEAN PLASMA GLUCOSE: 108.28 mg/dL

## 2017-06-16 LAB — TSH: TSH: 0.905 u[IU]/mL (ref 0.350–4.500)

## 2017-06-16 MED ORDER — TRAZODONE HCL 50 MG PO TABS
175.0000 mg | ORAL_TABLET | Freq: Every day | ORAL | Status: DC
  Administered 2017-06-16 – 2017-06-19 (×4): 175 mg via ORAL
  Filled 2017-06-16: qty 25
  Filled 2017-06-16 (×5): qty 3.5

## 2017-06-16 MED ORDER — AMOXICILLIN 250 MG PO CAPS
250.0000 mg | ORAL_CAPSULE | Freq: Three times a day (TID) | ORAL | Status: DC
  Administered 2017-06-16 – 2017-06-20 (×13): 250 mg via ORAL
  Filled 2017-06-16: qty 1
  Filled 2017-06-16: qty 11
  Filled 2017-06-16 (×11): qty 1
  Filled 2017-06-16 (×2): qty 11
  Filled 2017-06-16 (×4): qty 1

## 2017-06-16 MED ORDER — ARIPIPRAZOLE 5 MG PO TABS
5.0000 mg | ORAL_TABLET | Freq: Every day | ORAL | Status: DC
  Administered 2017-06-16 – 2017-06-19 (×4): 5 mg via ORAL
  Filled 2017-06-16 (×6): qty 1

## 2017-06-16 MED ORDER — NICOTINE POLACRILEX 2 MG MT GUM
2.0000 mg | CHEWING_GUM | OROMUCOSAL | Status: DC | PRN
  Administered 2017-06-16 – 2017-06-17 (×2): 2 mg via ORAL
  Filled 2017-06-16 (×2): qty 1

## 2017-06-16 NOTE — Progress Notes (Signed)
Recreation Therapy Notes  Date: 06/16/17 Time: 1000 Location: 500 Hall Dayroom  Group Topic: Coping Skills  Goal Area(s) Addresses:  Patient will be able to identify positive coping skills. Patient will be able to identify benefits of using positive coping skills post d/c.  Behavioral Response: Minimal  Intervention: Financial traderWeb worksheet, pencils  Activity: OrthoptistWeb Design.  Patients were given a blank Orthoptistweb design.  Patients were to imagine themselves being stuck inside the spider web.  Patients were to then label the things that have kept them "stuck" within the web.  Once patient identified those things, they were to come up with at least three coping skills for the situations they identified.  Education: PharmacologistCoping Skills, Building control surveyorDischarge Planning.   Education Outcome: Acknowledges understanding/In group clarification offered/Needs additional education.   Clinical Observations/Feedback: Pt arrived late.  Pt sat for most of group with his hand over his face listening.  Pt stated he hadn't been taking his medication which is why he is here. Pt acknowledged that he would take his medicines if someone watched him take them.    Caroll RancherMarjette Dalores Weger, LRT/CTRS         Caroll RancherLindsay, Emryn Flanery A 06/16/2017 11:45 AM

## 2017-06-16 NOTE — [facility] Group Notes (Signed)
LCSW Group Therapy Note   06/16/2017 10:30am   Type of Therapy and Topic:  Group Therapy:  Overcoming Obstacles   Participation Level:  Minimal   Description of Group:    In this group patients will be encouraged to explore what they see as obstacles to their own wellness and recovery. They will be guided to discuss their thoughts, feelings, and behaviors related to these obstacles. The group will process together ways to cope with barriers, with attention given to specific choices patients can make. Each patient will be challenged to identify changes they are motivated to make in order to overcome their obstacles. This group will be process-oriented, with patients participating in exploration of their own experiences as well as giving and receiving support and challenge from other group members.   Therapeutic Goals: 1. Patient will identify personal and current obstacles as they relate to admission. 2. Patient will identify barriers that currently interfere with their wellness or overcoming obstacles.  3. Patient will identify feelings, thought process and behaviors related to these barriers. 4. Patient will identify two changes they are willing to make to overcome these obstacles:      Summary of Patient Progress Pt participated minimally. He was able to define the word obstacle and identified working on getting Medicaid as a practical step to take when he is discharged.     Therapeutic Modalities:   Cognitive Behavioral Therapy Solution Focused Therapy Motivational Interviewing Relapse Prevention Therapy  Verdene LennertLauren C Heyward Douthit, LCSW 06/16/2017 9:55 AM

## 2017-06-16 NOTE — Progress Notes (Signed)
Recreation Therapy Notes  INPATIENT RECREATION THERAPY ASSESSMENT  Patient Details Name: Zachary LeavensJohn C [name] MRN: [id number] DOB: Jan 24, 1996 Today's Date: 06/16/2017  Patient Stressors: Family  Pt stated he was here for attitude problems.  Coping Skills:   Isolate, Avoidance, Exercise, Talking, Music, Sports  Personal Challenges: Anger, Communication, Decision-Making, Self-Esteem/Confidence, Stress Management, Trusting Others, Work Nutritional therapisterformance  Leisure Interests (2+):  Games - Video games, Individual - Other (Comment) (Riding around)  Biochemist, clinicalAwareness of Community Resources:  No  Patient Strengths:  Get along with others; basketball  Patient Identified Areas of Improvement:  "I don't know"  Current Recreation Participation:  1-2 times a week  Patient Goal for Hospitalization:  "Get some help, take medication  [place]  Current [place] (including self-harm):  No  Current HI:  No  Consent to Intern Participation: N/A   Caroll RancherMarjette Danijela Vessey, LRT/CTRS  Caroll RancherLindsay, Rory Montel A 06/16/2017, 2:23 PM

## 2017-06-16 NOTE — Progress Notes (Signed)
Adult Psychoeducational Group Note  Date:  06/16/2017 Time:  8:29 PM  Group Topic/Focus:  Wrap-Up Group:   The focus of this group is to help patients review their daily goal of treatment and discuss progress on daily workbooks.  Participation Level:  Active  Participation Quality:  Appropriate  Affect:  Appropriate  Cognitive:  Disorganized  Insight: Appropriate  Engagement in Group:  Engaged  Modes of Intervention:  Discussion  Additional Comments:  The patient expressed that he rates today a 9.The patient also said that he attended group.  Octavio Mannshigpen, Raymondo Garcialopez Lee 06/16/2017, 8:29 PM

## 2017-06-16 NOTE — Tx Team (Signed)
Interdisciplinary Treatment and Diagnostic Plan Update  06/16/2017 Time of Session: 0830AM Zachary LeavensJohn C Crafton MRN: [id number]  Principal Diagnosis: Schizoaffective disorder, bipolar type [facility](HCC)  Secondary Diagnoses: Principal Problem:   Schizoaffective disorder, bipolar type (HCC)   Current Medications:  Current Facility-Administered Medications  Medication Dose Route Frequency Provider Last Rate Last Dose  . acetaminophen (TYLENOL) tablet 650 mg  650 mg Oral Q6H PRN Charm [name], Zachary Y, NP   650 mg at 06/15/17 0306  . alum & mag hydroxide-simeth (MAALOX/MYLANTA) 200-200-20 MG/5ML suspension 30 mL  30 mL Oral Q6H PRN Charm [name], Zachary Y, NP      . ARIPiprazole (ABILIFY) tablet 10 mg  10 mg Oral Daily [name], Delight OvensVincent A, MD   10 mg at 06/16/17 [id number]  . benzocaine (ORAJEL) 10 % mucosal gel   Mouth/Throat QID PRN [name], Zachary A, NP      . cloNIDine HCl (KAPVAY) ER tablet 0.1 mg  0.1 mg Oral QHS Charm [name], Zachary Y, NP   0.1 mg at 06/15/17 2135  . ibuprofen (ADVIL,MOTRIN) tablet 600 mg  600 mg Oral Q6H PRN Charm [name], Zachary Y, NP      . lamoTRIgine (LAMICTAL) tablet 75 mg  75 mg Oral Daily Charm [name], Zachary Y, NP   75 mg at 06/16/17 [id number]  . magnesium hydroxide (MILK OF MAGNESIA) suspension 30 mL  30 mL Oral Daily PRN Charm [name], Zachary Y, NP      . nicotine (NICODERM CQ - dosed in mg/24 hours) patch 21 mg  21 mg Transdermal Daily PRN Charm [name], Zachary Y, NP      . ondansetron [facility](ZOFRAN) tablet 4 mg  4 mg Oral Q8H PRN Charm [name], Zachary Y, NP      . traZODone (DESYREL) tablet 150 mg  150 mg Oral QHS Charm [name], Zachary Y, NP   150 mg at 06/15/17 2135   PTA Medications: Prescriptions Prior to Admission  Medication Sig Dispense Refill Last Dose  . cloNIDine HCl (KAPVAY) 0.1 MG TB12 ER tablet Take 1 tablet (0.1 mg total) by mouth at bedtime. 30 tablet 0 Past Week at Unknown time  . lamoTRIgine (LAMICTAL) 25 MG tablet Take 3 tablets (75 mg total) by mouth daily. (Patient taking differently: Take 50 mg by mouth daily. ) 90 tablet 0 06/09/2017 at  evening  . risperiDONE microspheres (RISPERDAL CONSTA) 25 MG injection Inject 2 mLs (25 mg total) into the muscle every 14 (fourteen) days. Next dose due 03/19/17 1 each 0 Past Month at Unknown time    Patient Stressors: Marital or family conflict Medication change or noncompliance  Patient Strengths: Ability for insight Average or above average intelligence Capable of independent living General fund of knowledge  Treatment Modalities: Medication Management, Group therapy, Case management,  1 to 1 session with clinician, Psychoeducation, Recreational therapy.   Physician Treatment Plan for Primary Diagnosis: Schizoaffective disorder, bipolar type (HCC) Long Term Goal(s): Improvement in symptoms so as ready for discharge Improvement in symptoms so as ready for discharge   Short Term Goals: Ability to identify changes in lifestyle to reduce recurrence of condition will improve Ability to verbalize feelings will improve Ability to demonstrate self-control will improve Ability to identify and develop effective coping behaviors will improve Compliance with prescribed medications will improve  Medication Management: Evaluate patient's response, side effects, and tolerance of medication regimen.  Therapeutic Interventions: 1 to 1 sessions, Unit Group sessions and Medication administration.  Evaluation of Outcomes: Progressing  Physician Treatment Plan for Secondary Diagnosis: Principal Problem:   Schizoaffective disorder, bipolar type (HCC)  Long Term Goal(s): Improvement in symptoms so as ready for discharge Improvement in symptoms so as ready for discharge   Short Term Goals: Ability to identify changes in lifestyle to reduce recurrence of condition will improve Ability to verbalize feelings will improve Ability to demonstrate self-control will improve Ability to identify and develop effective coping behaviors will improve Compliance with prescribed medications will improve      Medication Management: Evaluate patient's response, side effects, and tolerance of medication regimen.  Therapeutic Interventions: 1 to 1 sessions, Unit Group sessions and Medication administration.  Evaluation of Outcomes: Progressing   RN Treatment Plan for Primary Diagnosis: Schizoaffective disorder, bipolar type (HCC) Long Term Goal(s): Knowledge of disease and therapeutic regimen to maintain health will improve  Short Term Goals: Ability to remain free from injury will improve, Ability to verbalize frustration and anger appropriately will improve and Ability to disclose and discuss suicidal ideas  Medication Management: RN will administer medications as ordered by provider, will assess and evaluate patient's response and provide education to patient for prescribed medication. RN will report any adverse and/or side effects to prescribing provider.  Therapeutic Interventions: 1 on 1 counseling sessions, Psychoeducation, Medication administration, Evaluate responses to treatment, Monitor vital signs and CBGs as ordered, Perform/monitor CIWA, COWS, AIMS and Fall Risk screenings as ordered, Perform wound care treatments as ordered.  Evaluation of Outcomes: Progressing   LCSW Treatment Plan for Primary Diagnosis: Schizoaffective disorder, bipolar type (HCC) Long Term Goal(s): Safe transition to appropriate next level of care at discharge, Engage patient in therapeutic group addressing interpersonal concerns.  Short Term Goals: Engage patient in aftercare planning with referrals and resources, Facilitate acceptance of mental health diagnosis and concerns and Identify triggers associated with mental health/substance abuse issues  Therapeutic Interventions: Assess for all discharge needs, 1 to 1 time with Social worker, Explore available resources and support systems, Assess for adequacy in community support network, Educate family and significant other(s) on suicide prevention, Complete  Psychosocial Assessment, Interpersonal group therapy.  Evaluation of Outcomes: Progressing   Progress in Treatment: Attending groups: Yes. Participating in groups: Yes. Taking medication as prescribed: Yes. Toleration medication: Yes. Family/Significant other contact made: SPE completed with pt; pt declined to consent to family contact.  Patient understands diagnosis: Yes. Discussing patient identified problems/goals with staff: Yes. Medical problems stabilized or resolved: Yes. Denies suicidal/homicidal ideation: Yes. Issues/concerns per patient self-inventory: No. and As evidenced by:  n/a Other: n/a   New problem(s) identified: No, Describe:  n/a  New Short Term/Long Term Goal(s): medication management for alcohol detox/depression/mood lability, elimination of SI thoughts, development of comprehensive mental wellness/sobriety plan.   Discharge Plan or Barriers: CSW assessing--interested in returning home and following up at [facility] for outpatient mental health services. MHAG pamphlet and AA list provided for additional community support.   Reason for Continuation of Hospitalization: Aggression Depression Medication stabilization Suicidal ideation Withdrawal symptoms  Estimated Length of Stay: Wed 06/18/17  Attendees: Patient: 06/16/2017 9:12 AM  Physician: Dr. Elna Breslow MD 06/16/2017 9:12 AM  Nursing: Peterson Ao RN 06/16/2017 9:12 AM  RN Care Manager:x 06/16/2017 9:12 AM  Social Worker: Herbert Seta Smart, LCSW; Chad Cordial LCSW 06/16/2017 9:12 AM  Recreational Therapist: x 06/16/2017 9:12 AM  Other: Hillery Jacks NP; Feliz Beam Money NP 06/16/2017 9:12 AM  Other:  06/16/2017 9:12 AM  Other: 06/16/2017 9:12 AM    Scribe for Treatment Team: Ledell Peoples Smart, LCSW 06/16/2017 9:12 AM

## 2017-06-16 NOTE — Progress Notes (Signed)
  DATA ACTION RESPONSE  Objective- Pt. is visible in the dayroom, seen pacing hallway with blanket over head.Presents with a preocuppied affect and mood. Difficult to understand since he covers mouth with blanket. Forwards little with interaction.  Subjective- Denies having any SI/HI/AVH at this time. Rates pain 6/10; R. Upper toothache. Is cooperative and remain safe on the unit.  1:1 interaction in private to establish rapport. Encouragement, education, & support given from staff.  PRN Ibuprofen and Orajel requested and will re-eval accordingly.   Safety maintained with Q 15 checks. Continue with POC.

## 2017-06-16 NOTE — Progress Notes (Signed)
DAR NOTE: Pt present with flat affect and depressed mood in the unit. Pt has been observed in the hallway walking covering himself with a blanket. Pt is isolative, guarded and minimising. Pt denies physical pain, took all his meds as scheduled. As per self inventory, pt had a good night sleep, good appetite, normal energy, and good concentration. Pt rate depression at 0, hopeless ness at 0, and anxiety at 0. Pt's safety ensured with 15 minute and environmental checks. Pt currently denies SI/HI and A/V hallucinations. Pt verbally agrees to seek staff if SI/HI or A/VH occurs and to consult with staff before acting on these thoughts. Will continue POC.

## 2017-06-16 NOTE — Plan of Care (Signed)
Problem: Activity: Goal: Interest or engagement in activities will improve Outcome: Progressing Pt. attended wrap-up group this evening.    

## 2017-06-16 NOTE — Progress Notes (Signed)
[facility] MD Progress Note  06/16/2017 1:30 PM [name]  MRN:  [id number] Subjective:  Patient states " I have a hole in my tooth and I do not want any air in it.'  Objective:Patient seen and chart reviewed.Discussed patient with treatment team. Pt seen as guarded, paranoid , bizarre. Pt was observed as holding a bedsheet over his face while he spoke to Clinical research associate. Pt also unable to talk clearly since he has his hands covering his mouth as he speaks. Pt reports sleep as restless. Pt continues to need a lot of support and redirection on the unit. Will continue treatment.     Principal Problem: Schizoaffective disorder, bipolar type (HCC) Diagnosis:   Patient Active Problem List   Diagnosis Date Noted  . Schizoaffective disorder, bipolar type (HCC) [F25.0] 02/26/2017  . 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 Cannabis use disorder, severe, dependence (HCC) [F12.20] 10/21/2014   Total Time spent with patient: 25 minutes  Past Psychiatric History: Please see H&P.    Past Medical History:  Past Medical History:  Diagnosis Date  . ADHD (attention deficit hyperactivity disorder)   . Schizophrenia (HCC)    History reviewed. No pertinent surgical history. Family History:  Family History  Problem Relation Age of Onset  . Bipolar disorder Mother   . Diabetes Mother   . Hypertension Mother    Family Psychiatric  History: Please see H&P.   Social History: Please see H&P.   History  Alcohol Use No    Comment: denies alcohol usag     History  Drug Use  . Types: Marijuana    Comment: everyday    Social History   Social History  . Marital status: Single    Spouse name: N/A  . Number of children: N/A  . Years of education: N/A   Social History Main Topics  . Smoking status: Current Every Day Smoker    Packs/day: 0.25    Types: Cigarettes  . Smokeless tobacco: Never Used  . Alcohol use No      Comment: denies alcohol usag  . Drug use: Yes    Types: Marijuana     Comment: everyday  . Sexual activity: Yes    Birth control/ protection: Condom   Other Topics Concern  . None   Social History Narrative  . None   Additional Social History:                         Sleep: restless  Appetite:  Fair  Current Medications: Current Facility-Administered Medications  Medication Dose Route Frequency Provider Last Rate Last Dose  . acetaminophen (TYLENOL) tablet 650 mg  650 mg Oral Q6H PRN [name], NP   650 mg at 06/15/17 0306  . alum & mag hydroxide-simeth (MAALOX/MYLANTA) 200-200-20 MG/5ML suspension 30 mL  30 mL Oral Q6H PRN [name], NP      . amoxicillin (AMOXIL) capsule 250 mg  250 mg Oral Q8H Kaynan Klonowski, MD      . ARIPiprazole (ABILIFY) tablet 10 mg  10 mg Oral Daily Izediuno, Delight Ovens, MD   10 mg at 06/16/17 4401  . ARIPiprazole (ABILIFY) tablet 5 mg  5 mg Oral QHS Cole Klugh, MD      . benzocaine (ORAJEL) 10 % mucosal gel   Mouth/Throat QID PRN [name], NP      . cloNIDine HCl (KAPVAY) ER tablet 0.1 mg  0.1 mg Oral QHS [name], NP   0.1 mg at 06/15/17 2135  . ibuprofen (ADVIL,MOTRIN) tablet 600 mg  600 mg Oral Q6H PRN [name], NP      . lamoTRIgine (LAMICTAL) tablet 75 mg  75 mg Oral Daily [name], NP   75 mg at 06/16/17 4098  . magnesium hydroxide (MILK OF MAGNESIA) suspension 30 mL  30 mL Oral Daily PRN [name], NP      . nicotine (NICODERM CQ - dosed in mg/24 hours) patch 21 mg  21 mg Transdermal Daily PRN [name], NP      . ondansetron [facility]) tablet 4 mg  4 mg Oral Q8H PRN [name], NP      . traZODone (DESYREL) tablet 175 mg  175 mg Oral QHS [name], MD        Lab Results:  Results for orders placed or performed during the hospital encounter of 06/13/17 (from the past 48 hour(s))  Lipid panel     Status: None   Collection Time: 06/16/17  5:58 AM  Result Value Ref  Range   Cholesterol 156 0 - 200 mg/dL   Triglycerides 75 <119 mg/dL   HDL 47 >14 mg/dL   Total CHOL/HDL Ratio 3.3 RATIO   VLDL 15 0 - 40 mg/dL   LDL Cholesterol 94 0 - 99 mg/dL    Comment:        Total Cholesterol/HDL:CHD Risk Coronary Heart Disease Risk Table                     Men   Women  1/2 Average Risk   3.4   3.3  Average Risk       5.0   4.4  2 X Average Risk   9.6   7.1  3 X Average Risk  23.4   11.0        Use the calculated Patient Ratio above and the CHD Risk Table to determine the patient's CHD Risk.        ATP III CLASSIFICATION (LDL):  <100     mg/dL   Optimal  782-956  mg/dL   Near or Above                    Optimal  130-159  mg/dL   Borderline  213-086  mg/dL   High  >578     mg/dL   Very High Performed at [facility], [address] [place], [place] [address]   Hemoglobin A1c     Status: None   Collection Time: 06/16/17  5:58 AM  Result Value Ref Range   Hgb A1c MFr Bld 5.4 4.8 - 5.6 %    Comment: (NOTE) Pre diabetes:          5.7%-6.4% Diabetes:              >6.4% Glycemic control for   <7.0% adults with diabetes    Mean Plasma Glucose 108.28 mg/dL    Comment: Performed at [facility], [address] [place], [place] [address]  TSH     Status: None   Collection Time: 06/16/17  5:58 AM  Result Value Ref Range   TSH 0.905 0.350 - 4.500 uIU/mL    Comment: Performed by a 3rd Generation assay with a functional sensitivity of <=0.01 uIU/mL. Performed at [facility], [address], [place], [place] [address]  Blood Alcohol level:  Lab Results  Component Value Date   ETH <5 06/11/2017   ETH <5 03/25/2017    Metabolic Disorder Labs: Lab Results  Component Value Date   HGBA1C 5.4 06/16/2017   MPG 108.28 06/16/2017   MPG 103 02/27/2017   No results found for: PROLACTIN Lab Results  Component Value Date   CHOL 156 06/16/2017   TRIG 75 06/16/2017   HDL 47 06/16/2017   CHOLHDL 3.3 06/16/2017   VLDL  15 06/16/2017   LDLCALC 94 06/16/2017   LDLCALC 109 (H) 02/27/2017    Physical Findings: AIMS: Facial and Oral Movements Muscles of Facial Expression: None, normal Lips and Perioral Area: None, normal Jaw: None, normal Tongue: None, normal,Extremity Movements Upper (arms, wrists, hands, fingers): None, normal Lower (legs, knees, ankles, toes): None, normal, Trunk Movements Neck, shoulders, hips: None, normal, Overall Severity Severity of abnormal movements (highest score from questions above): None, normal Incapacitation due to abnormal movements: None, normal Patient's awareness of abnormal movements (rate only patient's report): No Awareness, Dental Status Current problems with teeth and/or dentures?: Yes Does patient usually wear dentures?: No  CIWA:  CIWA-Ar Total: 1 COWS:     Musculoskeletal: Strength & Muscle Tone: within normal limits Gait & Station: normal Patient leans: N/A  Psychiatric Specialty Exam: Physical Exam  Nursing note and vitals reviewed.   Review of Systems  Psychiatric/Behavioral: Positive for substance abuse. The patient is nervous/anxious.   All other systems reviewed and are negative.   Blood pressure 123/66, pulse (!) 57, temperature 98.7 F (37.1 C), resp. rate 20, height 6\' 4"  (1.93 m), weight 106.6 kg (235 lb).Body mass index is 28.61 kg/m.  General Appearance: Guarded  Eye Contact:  Fair  Speech:  Slow  Volume:  Decreased  Mood:  Dysphoric  Affect:  Inappropriate  Thought Process:  Disorganized, Irrelevant and Descriptions of Associations: Circumstantial  Orientation:  Other:  self, situation  Thought Content:  Delusions and Paranoid Ideation  Suicidal Thoughts:  No  Homicidal Thoughts:  No  Memory:  Immediate;   Fair Recent;   Fair Remote;   Poor  Judgement:  Impaired  Insight:  Shallow  Psychomotor Activity:  Restlessness  Concentration:  Concentration: Fair and Attention Span: Fair  Recall:  FiservFair  Fund of Knowledge:  Fair   Language:  Fair  Akathisia:  No  Handed:  Right  AIMS (if indicated):     Assets:  Desire for Improvement  ADL's:  Intact  Cognition:  WNL  Sleep:  Number of Hours: 5     Treatment Plan Summary:Patient with psychosis, presented as disorganized , continues to be irrelevant and delusional, will continue to readjust his medications. Continue treatment. Daily contact with patient to assess and evaluate symptoms and progress in treatment, Medication management and Plan see below  Increase Abilify to 10 mg po daily and 5 mg po qhs for psychosis. Continue Lamictal 75 mg po daily for mood sx. Increase Trazodone to 175 mg po qhs for insomnia. Add Amoxil 250 mg po q8 h for tooth infection prevention. Continue orajel prn for tooth ache. Reviewed labs - lipid panel - wnl, pending PL. CSW will continue to work on disposition.  Latonda Larrivee, MD 06/16/2017, 1:30 PM

## 2017-06-17 LAB — PROLACTIN: Prolactin: 2.3 ng/mL — ABNORMAL LOW (ref 4.0–15.2)

## 2017-06-17 NOTE — Progress Notes (Signed)
DATA ACTION RESPONSE  Objective- Pt. is visible in the dayroom, seen pacing hallway with blanket over head.Presents with a preocuppied affect and mood. Difficult to understand since he covers mouth with hand. Appears suspicious with surroundings. Subjective- Denies having any SI/HI/AVH/Pain.Is cooperative and remain safe on the unit.  1:1 interaction in private to establish rapport. Encouragement, education, & support given from staff.  PRN nicorette requested and will re-eval accordingly.   Safety maintained with Q 15 checks. Continue with POC.

## 2017-06-17 NOTE — Progress Notes (Signed)
Adult Psychoeducational Group Note  Date:  06/17/2017 Time:  8:54 PM  Group Topic/Focus:  Wrap-Up Group:   The focus of this group is to help patients review their daily goal of treatment and discuss progress on daily workbooks.  Participation Level:  Minimal  Participation Quality:  Appropriate  Affect:  Appropriate  Cognitive:  Alert  Insight: Lacking  Engagement in Group:  Lacking  Modes of Intervention:  Discussion  Additional Comments:  Pt stated that he had a good day, but he does not have a goal.  Yuki Purves R 06/17/2017, 8:54 PM

## 2017-06-17 NOTE — Plan of Care (Signed)
Problem: Activity: Goal: Imbalance in normal sleep/wake cycle will improve Outcome: Progressing Pt slept 4.5 hrs last night.

## 2017-06-17 NOTE — BHH Group Notes (Signed)
LCSW Group Therapy Note  06/17/2017 1:15pm  Type of Therapy/Topic:  Group Therapy:  Balance in Life  Participation Level:  Minimal  Description of Group:    This group will address the concept of balance and how it feels and looks when one is unbalanced. Patients will be encouraged to process areas in their lives that are out of balance and identify reasons for remaining unbalanced. Facilitators will guide patients in utilizing problem-solving interventions to address and correct the stressor making their life unbalanced. Understanding and applying boundaries will be explored and addressed for obtaining and maintaining a balanced life. Patients will be encouraged to explore ways to assertively make their unbalanced needs known to significant others in their lives, using other group members and facilitator for support and feedback.  Therapeutic Goals: 1. Patient will identify two or more emotions or situations they have that consume much of in their lives. 2. Patient will identify signs/triggers that life has become out of balance:  3. Patient will identify two ways to set boundaries in order to achieve balance in their lives:  4. Patient will demonstrate ability to communicate their needs through discussion and/or role plays  Summary of Patient Progress:  Stayed the entire time, engaged throughout. Stated he feels balanced today.  "I have not gotten angry since I took my meds."  Amitted that he has short fuse when not in compliance.    Therapeutic Modalities:   Cognitive Behavioral Therapy Solution-Focused Therapy Assertiveness Training  Ida RogueRodney B Macee [place], [place] 06/17/2017 3:49 PM

## 2017-06-17 NOTE — Progress Notes (Signed)
DAR NOTE: Patient presents with flat affect and mood.  Denies auditory and visual hallucinations.  Described energy level as normal and concentration as good.  Rates depression at 0, hopelessness at 0, and anxiety at 0.  Maintained on routine safety checks.  Medications given as prescribed.  Support and encouragement offered as needed.  Attended group and participated.  States goal for today is "to get discharge home."  Patient visible in the dayroom.  Patient observed pacing the hallway with his hand over his mouth. Minimal interaction with staff and peers.  Patient is safe on the unit.

## 2017-06-17 NOTE — Progress Notes (Signed)
Recreation Therapy Notes  Animal-Assisted Activity (AAA) Program Checklist/Progress Notes Patient Eligibility Criteria Checklist & Daily Group note for Rec Tx Intervention  Date: 09.04.2018 Time: 2:45pm [address]    AAA/T Program Assumption of Risk Form signed by Patient/ or Parent Legal Guardian No  Behavioral Response: Did not attend.   Clinical Observations/Feedback: Patient discussed with MD for appropriateness in pet therapy session. Both LRT and MD agree patient is appropriate for participation. Patient approached offered participation in pet therapy session, patient declined stating he has allergies to dog fur. Patient speaks with his hands covering his mouth, making it difficult to understand. Patient tolerated LRT asking patient to repeat himself.    Marykay Lexenise L Blake Goya, LRT/CTRS        Jearl KlinefelterBlanchfield, Kolsen Choe L 06/17/2017 3:53 PM

## 2017-06-17 NOTE — Progress Notes (Signed)
Recreation Therapy Notes  Date: 06/17/17 Time: 1000 Location: [address] Dayroom  Group Topic: Self-Esteem  Goal Area(s) Addresses:  Patient will successfully identify positive attributes about themselves.  Patient will successfully identify benefit of improved self-esteem.   Behavioral Response: Engaged  Intervention: Magazines, glue sticks, construction paper, scissors, markers  Activity: [employer] Advertisement.  LRT introduced the technique of advertising to the patients.  Patients were to use the supplies provided to create an advertisement about themselves.  Patients were to highlight the things that make them unique and special.  Education:  Self-Esteem, Discharge Planning.   Education Outcome: Acknowledges education/In group clarification offered/Needs additional education  Clinical Observations/Feedback: Pt advertised his love of music, shoes, basketball and pasta.  Pt stated self esteem was important because "if you don't care about yourself, no one else will".    [name], [name]         [name], [name] 06/17/2017 12:06 PM

## 2017-06-17 NOTE — Progress Notes (Signed)
[facility] MD Progress Note  06/17/2017 1:55 PM [name]  MRN:  [id number] Subjective: Patient states " I am ok. I think I can go and stay with my brother.'   Objective:Patient seen and chart reviewed.Discussed patient with treatment team.  Pt today seen sitting in a dark room , continues to be bizarre, guarded , paranoid . Has limited insight in to his illness. Continues to talk with hands and bed sheet over his mouth. Required redirection through out our interaction. Pt per RN , continues to present as isolative , negative sx , seems to be internally preoccupied often.     Principal Problem: Schizoaffective disorder, bipolar type (HCC) Diagnosis:   Patient Active Problem List   Diagnosis Date Noted  . Schizoaffective disorder, bipolar type (HCC) [F25.0] 02/26/2017  . 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 Cannabis use disorder, severe, dependence (HCC) [F12.20] 10/21/2014   Total Time spent with patient: 20 minutes  Past Psychiatric History: Please see H&P.    Past Medical History:  Past Medical History:  Diagnosis Date  . ADHD (attention deficit hyperactivity disorder)   . Schizophrenia (HCC)    History reviewed. No pertinent surgical history. Family History:  Family History  Problem Relation Age of Onset  . Bipolar disorder Mother   . Diabetes Mother   . Hypertension Mother    Family Psychiatric  History: Please see H&P.   Social History: Please see H&P.   History  Alcohol Use No    Comment: denies alcohol usag     History  Drug Use  . Types: Marijuana    Comment: everyday    Social History   Social History  . Marital status: Single    Spouse name: N/A  . Number of children: N/A  . Years of education: N/A   Social History Main Topics  . Smoking status: Current Every Day Smoker    Packs/day: 0.25    Types: Cigarettes  . Smokeless tobacco: Never Used  . Alcohol use  No     Comment: denies alcohol usag  . Drug use: Yes    Types: Marijuana     Comment: everyday  . Sexual activity: Yes    Birth control/ protection: Condom   Other Topics Concern  . None   Social History Narrative  . None   Additional Social History:                         Sleep: Fair  Appetite:  Fair  Current Medications: Current Facility-Administered Medications  Medication Dose Route Frequency Provider Last Rate Last Dose  . acetaminophen (TYLENOL) tablet 650 mg  650 mg Oral Q6H PRN [name], NP   650 mg at 06/15/17 0306  . alum & mag hydroxide-simeth (MAALOX/MYLANTA) 200-200-20 MG/5ML suspension 30 mL  30 mL Oral Q6H PRN [name], NP      . amoxicillin (AMOXIL) capsule 250 mg  250 mg Oral Q8H Kentrail Shew, MD   250 mg at 06/17/17 0649  . ARIPiprazole (ABILIFY) tablet 10 mg  10 mg Oral Daily Izediuno, Delight Ovens, MD   10 mg at 06/17/17 0834  . ARIPiprazole (ABILIFY) tablet 5 mg  5 mg Oral QHS [name], MD   5 mg at 06/16/17 2108  . benzocaine (ORAJEL) 10 % mucosal gel   Mouth/Throat QID PRN [name], NP      . cloNIDine HCl (KAPVAY)  ER tablet 0.1 mg  0.1 mg Oral QHS [name], NP   0.1 mg at 06/16/17 2108  . ibuprofen (ADVIL,MOTRIN) tablet 600 mg  600 mg Oral Q6H PRN [name], NP   600 mg at 06/16/17 2109  . lamoTRIgine (LAMICTAL) tablet 75 mg  75 mg Oral Daily [name], NP   75 mg at 06/17/17 0835  . magnesium hydroxide (MILK OF MAGNESIA) suspension 30 mL  30 mL Oral Daily PRN [name], NP      . nicotine polacrilex (NICORETTE) gum 2 mg  2 mg Oral PRN [name], NP   2 mg at 06/16/17 2109  . ondansetron (ZOFRAN) tablet 4 mg  4 mg Oral Q8H PRN [name], NP      . traZODone (DESYREL) tablet 175 mg  175 mg Oral QHS [name], MD   175 mg at 06/16/17 2108    Lab Results:  Results for orders placed or performed during the hospital encounter of 06/13/17 (from the past 48 hour(s))  Lipid panel      Status: None   Collection Time: 06/16/17  5:58 AM  Result Value Ref Range   Cholesterol 156 0 - 200 mg/dL   Triglycerides 75 <409 mg/dL   HDL 47 >81 mg/dL   Total CHOL/HDL Ratio 3.3 RATIO   VLDL 15 0 - 40 mg/dL   LDL Cholesterol 94 0 - 99 mg/dL    Comment:        Total Cholesterol/HDL:CHD Risk Coronary Heart Disease Risk Table                     Men   Women  1/2 Average Risk   3.4   3.3  Average Risk       5.0   4.4  2 X Average Risk   9.6   7.1  3 X Average Risk  23.4   11.0        Use the calculated Patient Ratio above and the CHD Risk Table to determine the patient's CHD Risk.        ATP III CLASSIFICATION (LDL):  <100     mg/dL   Optimal  191-478  mg/dL   Near or Above                    Optimal  130-159  mg/dL   Borderline  295-621  mg/dL   High  >308     mg/dL   Very High Performed at [facility], [address], [place], [place] [address]   Hemoglobin A1c     Status: None   Collection Time: 06/16/17  5:58 AM  Result Value Ref Range   Hgb A1c MFr Bld 5.4 4.8 - 5.6 %    Comment: (NOTE) Pre diabetes:          5.7%-6.4% Diabetes:              >6.4% Glycemic control for   <7.0% adults with diabetes    Mean Plasma Glucose 108.28 mg/dL    Comment: Performed at [facility], [address], [place], [place] [address]  TSH     Status: None   Collection Time: 06/16/17  5:58 AM  Result Value Ref Range   TSH 0.905 0.350 - 4.500 uIU/mL    Comment: Performed by a 3rd Generation assay with a functional sensitivity of <=0.01 uIU/mL. Performed at [facility], [address] [place], [place] [id number]     Blood Alcohol level:  Lab Results  Component Value Date   ETH <5 06/11/2017   ETH <5 03/25/2017    Metabolic Disorder Labs: Lab Results  Component Value Date   HGBA1C 5.4 06/16/2017   MPG 108.28 06/16/2017   MPG 103 02/27/2017   No results found for: PROLACTIN Lab Results  Component Value Date   CHOL 156 06/16/2017    TRIG 75 06/16/2017   HDL 47 06/16/2017   CHOLHDL 3.3 06/16/2017   VLDL 15 06/16/2017   LDLCALC 94 06/16/2017   LDLCALC 109 (H) 02/27/2017    Physical Findings: AIMS: Facial and Oral Movements Muscles of Facial Expression: None, normal Lips and Perioral Area: None, normal Jaw: None, normal Tongue: None, normal,Extremity Movements Upper (arms, wrists, hands, fingers): None, normal Lower (legs, knees, ankles, toes): None, normal, Trunk Movements Neck, shoulders, hips: None, normal, Overall Severity Severity of abnormal movements (highest score from questions above): None, normal Incapacitation due to abnormal movements: None, normal Patient's awareness of abnormal movements (rate only patient's report): No Awareness, Dental Status Current problems with teeth and/or dentures?: Yes Does patient usually wear dentures?: No  CIWA:  CIWA-Ar Total: 1 COWS:     Musculoskeletal: Strength & Muscle Tone: within normal limits Gait & Station: normal Patient leans: N/A  Psychiatric Specialty Exam: Physical Exam  Nursing note and vitals reviewed.   Review of Systems  Psychiatric/Behavioral: Positive for substance abuse. The patient is nervous/anxious.   All other systems reviewed and are negative.   Blood pressure 123/66, pulse (!) 57, temperature 98.7 F (37.1 C), resp. rate 20, height 6\' 4"  (1.93 m), weight 106.6 kg (235 lb).Body mass index is 28.61 kg/m.  General Appearance: Guarded  Eye Contact:  Fair  Speech:  Slow  Volume:  Decreased  Mood:  Dysphoric  Affect:  Inappropriate  Thought Process:  Disorganized, Irrelevant and Descriptions of Associations: Circumstantial  Orientation:  Other:  self , situation  Thought Content:  Delusions and Paranoid Ideation  Suicidal Thoughts:  No  Homicidal Thoughts:  No  Memory:  Immediate;   Fair Recent;   Fair Remote;   Poor  Judgement:  Impaired  Insight:  Shallow  Psychomotor Activity:  Restlessness  Concentration:  Concentration:  Fair and Attention Span: Fair  Recall:  [employer]  Fund of Knowledge:  Fair  Language:  Fair  Akathisia:  No  Handed:  Right  AIMS (if indicated):     Assets:  Desire for Improvement  ADL's:  Intact  Cognition:  WNL  Sleep:  Number of Hours: 4.5   Will continue today 06/17/17  plan as below except where it is noted.   Treatment Plan Summary:Patiet with psychosis , presented as disorganized and delusional.Continues to have negative sx . Will continue treatment . Daily contact with patient to assess and evaluate symptoms and progress in treatment, Medication management and Plan see below  Abilify 10 mg po daily and 5 mg po qhs for psychosis. Continue Lamictal 75 mg po daily for mood sx. Increase Trazodone to 175 mg po qhs for insomnia. Amoxil 250 mg po q8 h for tooth infection prevention. Continue orajel prn for tooth ache. Ordered EKG for qtc monitoring. CSW will continue to work on disposition.  Dariella Gillihan, MD 06/17/2017, 1:55 PM

## 2017-06-18 MED ORDER — ARIPIPRAZOLE ER 400 MG IM SRER
400.0000 mg | INTRAMUSCULAR | Status: DC
  Administered 2017-06-18: 400 mg via INTRAMUSCULAR
  Filled 2017-06-18: qty 400

## 2017-06-18 NOTE — Progress Notes (Signed)
D:  Patient's self inventory sheet, patient has good sleep. No sleep medication given.  Good appetite, normal energy level, good concentration.  Denied depression, hopeless and anxiety.  Denied withdrawals.  Denied SI.  Denied physical problems.  Headache last night, worst pain in past 24 hours is #1.  Pain medication helpful.  Goal is getting out.  Plans to go to groups.  Does have discharge plans. A:  Medications administered per MD orders.  Emotional support and encouragement given patient. R: Patient denied SI and HI, contracts for safety.   Denied A/V hallucinations.  Safety maintained with 15 minute checks. Patient walks hallway with mouth covered with hand or blanket.

## 2017-06-18 NOTE — Plan of Care (Signed)
Problem: Coping: Goal: Ability to verbalize frustrations and anger appropriately will improve Outcome: Progressing Nurse discussed depression/anxiety/coping skills with patient.    

## 2017-06-18 NOTE — Plan of Care (Signed)
Problem: Safety: Goal: Periods of time without injury will increase Outcome: Progressing Pt safe on the unit at this time   

## 2017-06-18 NOTE — [facility] Group Notes (Signed)
LCSW Group Therapy Note   06/18/2017 1:15pm   Type of Therapy and Topic:  Group Therapy:  Positive Affirmations   Participation Level:  Active  Description of Group: This group addressed positive affirmation toward self and others. Patients went around the room and identified two positive things about themselves and two positive things about a peer in the room. Patients reflected on how it felt to share something positive with others, to identify positive things about themselves, and to hear positive things from others. Patients were encouraged to have a daily reflection of positive characteristics or circumstances.  Therapeutic Goals 1. Patient will verbalize two of their positive qualities 2. Patient will demonstrate empathy for others by stating two positive qualities about a peer in the group 3. Patient will verbalize their feelings when voicing positive self affirmations and when voicing positive affirmations of others 4. Patients will discuss the potential positive impact on their wellness/recovery of focusing on positive traits of self and others. Summary of Patient Progress:  Jonny RuizJohn attended group the entire time and was an active participant.  He has limited insight into his resilience.    Therapeutic Modalities Cognitive Behavioral Therapy Motivational Interviewing  Carlynn Heraldngel M Silena Wyss, Student-Social Work 06/18/2017 1:27 PM

## 2017-06-18 NOTE — Progress Notes (Signed)
Recreation Therapy Notes  Date:  06/18/17 Time: 1000 Location: 500 Hall Dayroom  Group Topic: Communication  Goal Area(s) Addresses:  Patient will effectively communicate with peers in group.  Patient will verbalize benefit of healthy communication. Patient will verbalize positive effect of healthy communication on post d/c goals.  Patient will identify communication techniques that made activity effective for group.   Behavioral Response: Engaged  Intervention: Blank paper, pencils, geometrical pictures  Activity: Back to back Drawing.  Patients were divided into teams of two.  Patients were to sit back to back.  One person would start off as the speaker, while the other person would be the listener.  The speaker was given a geometrical picture to describe to their partner.  The listener had to recreate the picture being described to them to the best of their abilities.  However, the listener could not ask any questions.  Patients would switch roles on the second round.   Education: Communication, Discharge Planning  Education Outcome: Acknowledges understanding/In group clarification offered/Needs additional education.   Clinical Observations/Feedback: Pt stated as the listener, he messed up in the middle of his triangle.  As the speaker, pt stated he gave "horrible instructions" because his partner's drawing didn't look like the picture he had.  Pt gave minimal feedback during processing.   Caroll RancherMarjette Sema Stangler, LRT/CTRS        Caroll RancherLindsay, Makayela Secrest A 06/18/2017 11:49 AM

## 2017-06-18 NOTE — Progress Notes (Addendum)
[facility]  06/18/2017 2:43 PM Zachary LeavensJohn C [name]  MRN:  [id number] Subjective: Patient states " I am fine.'    Objective:Patient seen and chart reviewed.Discussed patient with treatment team. Pt today seen as guarded , paranoid , with partial improvement. Pt continues to be very minimal when questions asked , however with encouragement has been attending groups and participating in milieu. Pt agrees to Abilify Maintenna IM - will offer. Continues to need support.     Principal Problem: Schizoaffective disorder, bipolar type (HCC) Diagnosis:   Patient Active Problem List   Diagnosis Date Noted  . Schizoaffective disorder, bipolar type (HCC) [F25.0] 02/26/2017  . 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 Cannabis use disorder, severe, dependence (HCC) [F12.20] 10/21/2014   Total Time spent with patient: 20 minutes  Past Psychiatric History: Please see H&P.    Past Medical History:  Past Medical History:  Diagnosis Date  . ADHD (attention deficit hyperactivity disorder)   . Schizophrenia (HCC)    History reviewed. No pertinent surgical history. Family History:  Family History  Problem Relation Age of Onset  . Bipolar disorder Mother   . Diabetes Mother   . Hypertension Mother    Family Psychiatric  History: Please see H&P.   Social History: Please see H&P.   History  Alcohol Use No    Comment: denies alcohol usag     History  Drug Use  . Types: Marijuana    Comment: everyday    Social History   Social History  . Marital status: Single    Spouse name: N/A  . Number of children: N/A  . Years of education: N/A   Social History Main Topics  . Smoking status: Current Every Day Smoker    Packs/day: 0.25    Types: Cigarettes  . Smokeless tobacco: Never Used  . Alcohol use No     Comment: denies alcohol usag  . Drug use: Yes    Types: Marijuana     Comment: everyday   . Sexual activity: Yes    Birth control/ protection: Condom   Other Topics Concern  . None   Social History Narrative  . None   Additional Social History:                         Sleep: Fair  Appetite:  Fair  Current Medications: Current Facility-Administered Medications  Medication Dose Route Frequency Provider Last Rate Last Dose  . acetaminophen (TYLENOL) tablet 650 mg  650 mg Oral Q6H PRN Charm RingsLord, Jamison Y, NP   650 mg at 06/15/17 0306  . alum & mag hydroxide-simeth (MAALOX/MYLANTA) 200-200-20 MG/5ML suspension 30 mL  30 mL Oral Q6H PRN Charm RingsLord, Jamison Y, NP      . amoxicillin (AMOXIL) capsule 250 mg  250 mg Oral Q8H Kloi Brodman, MD   250 mg at 06/18/17 1345  . ARIPiprazole (ABILIFY) tablet 10 mg  10 mg Oral Daily Izediuno, Delight OvensVincent A, MD   10 mg at 06/18/17 0736  . ARIPiprazole (ABILIFY) tablet 5 mg  5 mg Oral QHS Jomarie LongsEappen, Luciel Brickman, MD   5 mg at 06/17/17 2102  . ARIPiprazole ER SRER 400 mg  400 mg Intramuscular Q30 days Mystery Schrupp, MD      . benzocaine (ORAJEL) 10 % mucosal gel   Mouth/Throat QID PRN Nira ConnBerry, Jason A, NP      . cloNIDine HCl (KAPVAY) ER tablet 0.1 mg  0.1 mg Oral QHS [name], NP   0.1 mg at 06/17/17 2102  . ibuprofen (ADVIL,MOTRIN) tablet 600 mg  600 mg Oral Q6H PRN [name], NP   600 mg at 06/17/17 2359  . lamoTRIgine (LAMICTAL) tablet 75 mg  75 mg Oral Daily [name], NP   75 mg at 06/18/17 0736  . magnesium hydroxide (MILK OF MAGNESIA) suspension 30 mL  30 mL Oral Daily PRN [name], NP      . nicotine polacrilex (NICORETTE) gum 2 mg  2 mg Oral PRN [name], NP   2 mg at 06/17/17 2102  . ondansetron (ZOFRAN) tablet 4 mg  4 mg Oral Q8H PRN [name], NP      . traZODone (DESYREL) tablet 175 mg  175 mg Oral QHS [name], MD   175 mg at 06/17/17 2102    Lab Results:  No results found for this or any previous visit (from the past 48 hour(s)).  Blood Alcohol level:  Lab Results  Component Value  Date   ETH <5 06/11/2017   ETH <5 03/25/2017    Metabolic Disorder Labs: Lab Results  Component Value Date   HGBA1C 5.4 06/16/2017   MPG 108.28 06/16/2017   MPG 103 02/27/2017   Lab Results  Component Value Date   PROLACTIN 2.3 (L) 06/16/2017   Lab Results  Component Value Date   CHOL 156 06/16/2017   TRIG 75 06/16/2017   HDL 47 06/16/2017   CHOLHDL 3.3 06/16/2017   VLDL 15 06/16/2017   LDLCALC 94 06/16/2017   LDLCALC 109 (H) 02/27/2017    Physical Findings: AIMS: Facial and Oral Movements Muscles of Facial Expression: None, normal Lips and Perioral Area: None, normal Jaw: None, normal Tongue: None, normal,Extremity Movements Upper (arms, wrists, hands, fingers): None, normal Lower (legs, knees, ankles, toes): None, normal, Trunk Movements Neck, shoulders, hips: None, normal, Overall Severity Severity of abnormal movements (highest score from questions above): None, normal Incapacitation due to abnormal movements: None, normal Patient's awareness of abnormal movements (rate only patient's report): No Awareness, Dental Status Current problems with teeth and/or dentures?: Yes Does patient usually wear dentures?: No  CIWA:  CIWA-Ar Total: 1 COWS:  COWS Total Score: 2  Musculoskeletal: Strength & Muscle Tone: within normal limits Gait & Station: normal Patient leans: N/A  Psychiatric Specialty Exam: Physical Exam  Nursing note and vitals reviewed.   Review of Systems  Psychiatric/Behavioral: Positive for substance abuse. The patient is nervous/anxious.   All other systems reviewed and are negative.   Blood pressure 116/76, pulse 89, temperature 98 F (36.7 C), temperature source Oral, resp. rate 16, height 6\' 4"  (1.93 m), weight 106.6 kg (235 lb).Body mass index is 28.61 kg/m.  General Appearance: Guarded  Eye Contact:  Fair  Speech:  Slow  Volume:  Decreased  Mood:  Anxious  Affect:  Inappropriate, more appropriate  Thought Process:  Irrelevant and  Descriptions of Associations: Circumstantial, more relevant   Orientation:  Other:  self , situation  Thought Content:  Delusions and Paranoid Ideation, improving  Suicidal Thoughts:  No  Homicidal Thoughts:  No  Memory:  Immediate;   Fair Recent;   Fair Remote;   Poor  Judgement:  Impaired  Insight:  Shallow  Psychomotor Activity:  Restlessness  Concentration:  Concentration: Fair and Attention Span: Fair  Recall:  [employer] of Knowledge:  Fair  Language:  Fair  Akathisia:  No  Handed:  Right  AIMS (if indicated):     Assets:  Desire for Improvement  ADL's:  Intact  Cognition:  WNL  Sleep:  Number of Hours: 6.25   Will continue today 06/18/17  plan as below except where it is noted.  Treatment Plan Summary:Patient with delusions , disorganized behavior , currently improving with medications , agrees to an LAI , will offer it today .  Will continue treatment.  Daily contact with patient to assess and evaluate symptoms and progress in treatment, Medication management and Plan see below  Abilify 10 mg po daily and 5 mg po qhs for psychosis. Abilify Maintenna IM 400 mg today - 06/18/2017  Continue Lamictal 75 mg po daily for mood sx. Trazodone 175 mg po qhs for insomnia. Amoxil 250 mg po q8 h for tooth infection prevention. Continue orajel prn for tooth ache. Reviewed EKG for qtc monitoring qtc - wnl. CSW will continue to work on disposition.   [place], MD 06/18/2017, 2:43 PM

## 2017-06-18 NOTE — Progress Notes (Signed)
D: Pt denies SI/HI/AVH. Pt is pleasant and cooperative. Pt seen pacing the unit at times. Pt stated he was doing better because of his medications.   A: Pt was offered support and encouragement. Pt was given scheduled medications. Pt was encourage to attend groups. Q 15 minute checks were done for safety.   R:Pt attends groups and interacts well with peers and staff. Pt is taking medication. Pt has no complaints.Pt receptive to treatment and safety maintained on unit.

## 2017-06-18 NOTE — Plan of Care (Signed)
Problem: Education: Goal: Mental status will improve Outcome: Progressing Nurse discussed depression/anxiety/coping skills with patient.    

## 2017-06-19 MED ORDER — HYDROXYZINE HCL 50 MG PO TABS
50.0000 mg | ORAL_TABLET | Freq: Once | ORAL | Status: DC
  Filled 2017-06-19: qty 1

## 2017-06-19 NOTE — Progress Notes (Signed)
DAR NOTE: Patient presents with flat affect and depressed mood.  Denies pain, auditory and visual hallucinations.  Rates depression at 0, hopelessness at 0, and anxiety at 0.  Maintained on routine safety checks.  Medications given as prescribed.  Support and encouragement offered as needed.  Attended group and participated.  States goal for today is "getting out of here."  Patient observed pacing the unit.  Minimal interaction with staff and peers. Offered no complaint.

## 2017-06-19 NOTE — Progress Notes (Signed)
Adult Psychoeducational Group Note  Date:  06/19/2017 Time:  10:06 PM  Group Topic/Focus:  Wrap-Up Group:   The focus of this group is to help patients review their daily goal of treatment and discuss progress on daily workbooks.  Participation Level:  Active  Participation Quality:  Appropriate  Affect:  Appropriate  Cognitive:  Alert  Insight: Appropriate  Engagement in Group:  Engaged  Modes of Intervention:  Discussion  Additional Comments:  Patient stated having a good day. Patient stated he will be discharging tomorrow.  Haily Caley L Cheikh Bramble 06/19/2017, 10:06 PM

## 2017-06-19 NOTE — [facility] Group Notes (Signed)
LCSW Group Therapy Note   06/19/2017 1:15pm   Type of Therapy and Topic:  Group Therapy:  Positive Affirmations   Participation Level:  Did Not Attend  Description of Group: This group addressed positive affirmation toward self and others. Patients went around the room and identified two positive things about themselves and two positive things about a peer in the room. Patients reflected on how it felt to share something positive with others, to identify positive things about themselves, and to hear positive things from others. Patients were encouraged to have a daily reflection of positive characteristics or circumstances.  Therapeutic Goals 1. Patient will verbalize two of their positive qualities 2. Patient will demonstrate empathy for others by stating two positive qualities about a peer in the group 3. Patient will verbalize their feelings when voicing positive self affirmations and when voicing positive affirmations of others 4. Patients will discuss the potential positive impact on their wellness/recovery of focusing on positive traits of self and others. Summary of Patient Progress:   Therapeutic Modalities Cognitive Behavioral Therapy Motivational Interviewing  Carlynn Heraldngel M Shawnia Vizcarrondo, Student-Social Work 06/19/2017 1:47 PM

## 2017-06-19 NOTE — Progress Notes (Signed)
  [facility] Discharge Plan :  Will you be returning to the same living situation after discharge:  Yes,  home At discharge, do you have transportation home?: Yes,  bus  Do you have the ability to pay for your medications: Yes,  mental health  Release of information consent forms completed and in the chart;  Patient's signature needed at discharge.  Patient to Follow up at: Follow-up Information    [facility] Follow up on 06/24/2017.   Why:  Tuesday at 10AM for your hospital follow up appointment with Dr Jacqualine CodeM  [facility]  You can reach [employer] at [phone]  Contact information: [address]  [place] [id number] [phone]           Next level of care provider [facility]:no  Safety Planning and Suicide Prevention discussed: Yes,  yes  Have you used any form of tobacco in the last 30 days? (Cigarettes, Smokeless Tobacco, Cigars, and/or Pipes): Yes  Has patient been referred to the Quitline?: Patient refused referral  Patient has been referred for addiction treatment: Pt. refused referral  Ida RogueRodney B Harjas Biggins, LCSW 06/19/2017, 3:44 PM

## 2017-06-19 NOTE — Progress Notes (Signed)
[facility] MD Progress Note  06/19/2017 3:14 PM [name]  MRN:  [id number]  Subjective: Patient states "I'm alright'  Objective: Patient seen & chart reviewed. Discussed patient with the treatment team. Pt today remains guarded, paranoid with his hand covering his mouth. Presents with partial improvement. He continues to provide minimal answers when asked questions, however with encouragement has been attending groups and participating in milieu. Pt agrees to [employer] IM - and received the first dose on 06-18-17. This medication is due every 30 days. Continues to need support.  Principal Problem: Schizoaffective disorder, bipolar type (HCC) Diagnosis:   Patient Active Problem List   Diagnosis Date Noted  . Schizoaffective disorder, bipolar type (HCC) [F25.0] 02/26/2017    Priority: High  . 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 Cannabis use disorder, severe, dependence (HCC) [F12.20] 10/21/2014   Total Time spent with patient: 15 minutes  Past Psychiatric History: Please see H&P.  Past Medical History:  Past Medical History:  Diagnosis Date  . ADHD (attention deficit hyperactivity disorder)   . Schizophrenia (HCC)    History reviewed. No pertinent surgical history.  Family History:  Family History  Problem Relation Age of Onset  . Bipolar disorder Mother   . Diabetes Mother   . Hypertension Mother    Family Psychiatric  History: Please see H&P.  Social History: Please see H&P.  History  Alcohol Use No    Comment: denies alcohol usag     History  Drug Use  . Types: Marijuana    Comment: everyday    Social History   Social History  . Marital status: Single    Spouse name: N/A  . Number of children: N/A  . Years of education: N/A   Social History Main Topics  . Smoking status: Current Every Day Smoker    Packs/day: 0.25    Types: Cigarettes  .  Smokeless tobacco: Never Used  . Alcohol use No     Comment: denies alcohol usag  . Drug use: Yes    Types: Marijuana     Comment: everyday  . Sexual activity: Yes    Birth control/ protection: Condom   Other Topics Concern  . None   Social History Narrative  . None   Additional Social History:   Sleep: Fair  Appetite:  Fair  Current Medications: Current Facility-Administered Medications  Medication Dose Route Frequency Provider Last Rate Last Dose  . acetaminophen (TYLENOL) tablet 650 mg  650 mg Oral Q6H PRN [name], NP   650 mg at 06/15/17 0306  . alum & mag hydroxide-simeth (MAALOX/MYLANTA) 200-200-20 MG/5ML suspension 30 mL  30 mL Oral Q6H PRN [name], NP      . amoxicillin (AMOXIL) capsule 250 mg  250 mg Oral Q8H Eappen, Saramma, MD   250 mg at 06/19/17 1341  . ARIPiprazole (ABILIFY) tablet 10 mg  10 mg Oral Daily Izediuno, Delight Ovens, MD   10 mg at 06/19/17 0750  . ARIPiprazole (ABILIFY) tablet 5 mg  5 mg Oral QHS [name], MD   5 mg at 06/18/17 2115  . ARIPiprazole ER SRER 400 mg  400 mg Intramuscular Q30 days [name], MD   400 mg at 06/18/17 1608  . benzocaine (ORAJEL) 10 % mucosal gel   Mouth/Throat QID PRN [name], NP      . cloNIDine HCl (KAPVAY) ER tablet 0.1 mg  0.1 mg Oral QHS [name], NP   0.1 mg at 06/18/17 2115  . hydrOXYzine (ATARAX/VISTARIL) tablet 50 mg  50 mg Oral Once [name], PA-C      . ibuprofen (ADVIL,MOTRIN) tablet 600 mg  600 mg Oral Q6H PRN [name], NP   600 mg at 06/17/17 2359  . lamoTRIgine (LAMICTAL) tablet 75 mg  75 mg Oral Daily [name], NP   75 mg at 06/19/17 0750  . magnesium hydroxide (MILK OF MAGNESIA) suspension 30 mL  30 mL Oral Daily PRN [name], NP      . nicotine polacrilex (NICORETTE) gum 2 mg  2 mg Oral PRN [name], NP   2 mg at 06/17/17 2102  . ondansetron (ZOFRAN) tablet 4 mg  4 mg Oral Q8H PRN [name], NP      . traZODone (DESYREL) tablet  175 mg  175 mg Oral QHS [name], MD   175 mg at 06/18/17 2115   Lab Results:  No results found for this or any previous visit (from the past 48 hour(s)).  Blood Alcohol level:  Lab Results  Component Value Date   ETH <5 06/11/2017   ETH <5 03/25/2017   Metabolic Disorder Labs: Lab Results  Component Value Date   HGBA1C 5.4 06/16/2017   MPG 108.28 06/16/2017   MPG 103 02/27/2017   Lab Results  Component Value Date   PROLACTIN 2.3 (L) 06/16/2017   Lab Results  Component Value Date   CHOL 156 06/16/2017   TRIG 75 06/16/2017   HDL 47 06/16/2017   CHOLHDL 3.3 06/16/2017   VLDL 15 06/16/2017   LDLCALC 94 06/16/2017   LDLCALC 109 (H) 02/27/2017   Physical Findings: AIMS: Facial and Oral Movements Muscles of Facial Expression: None, normal Lips and Perioral Area: None, normal Jaw: None, normal Tongue: None, normal,Extremity Movements Upper (arms, wrists, hands, fingers): None, normal Lower (legs, knees, ankles, toes): None, normal, Trunk Movements Neck, shoulders, hips: None, normal, Overall Severity Severity of abnormal movements (highest score from questions above): None, normal Incapacitation due to abnormal movements: None, normal Patient's awareness of abnormal movements (rate only patient's report): No Awareness, Dental Status Current problems with teeth and/or dentures?: Yes Does patient usually wear dentures?: No  CIWA:  CIWA-Ar Total: 1 COWS:  COWS Total Score: 2  Musculoskeletal: Strength & Muscle Tone: within normal limits Gait & Station: normal Patient leans: N/A  Psychiatric Specialty Exam: Physical Exam  Nursing note and vitals reviewed.   Review of Systems  Psychiatric/Behavioral: Positive for substance abuse. The patient is nervous/anxious.   All other systems reviewed and are negative.   Blood pressure 136/80, pulse 67, temperature (!) 97.5 F (36.4 C), temperature source Oral, resp. rate 16, height  (1.93 m), weight 106.6 kg (235  lb).Body mass index is 28.61 kg/m.  General Appearance: Guarded  Eye Contact:  Fair  Speech:  Slow  Volume:  Decreased  Mood:  Anxious  Affect:  Inappropriate, more appropriate  Thought Process:  Irrelevant and Descriptions of Associations: Circumstantial, more relevant   Orientation:  Other:  self , situation  Thought Content:  Delusions and Paranoid Ideation, improving  Suicidal Thoughts:  No  Homicidal Thoughts:  No  Memory:  Immediate;   Fair Recent;   Fair Remote;   Poor  Judgement:  Impaired  Insight:  Shallow  Psychomotor Activity:  Restlessness  Concentration:  Concentration: Fair and Attention Span: Fair  Recall:  Fair  Fund of Knowledge:  Fair  Language:  Fair  Akathisia:  No  Handed:  Right  AIMS (if indicated):     Assets:  Desire for Improvement  ADL's:  Intact  Cognition:  WNL  Sleep:  Number of Hours: 2.5   Will continue today 06/19/17  plan as below except where it is noted.  Treatment Plan Summary: Patient with delusions, disorganized behavior, currently improving with medications, agrees to an LAI was offered & received yesterday .  Will continue treatment.  Daily contact with patient to assess and evaluate symptoms and progress in treatment, Medication management and Plan see below  -Continue Abilify 10 mg po daily and 5 mg po qhs for psychosis. -Abilify Maintenna IM 400 mg today - 06/18/2017, continue Q 30 days. -Continue Lamictal 75 mg po daily for mood sx. -Trazodone 175 mg po qhs for insomnia. -Amoxil 250 mg po q8 h for tooth infection prevention. -Continue orajel prn for tooth ache. Reviewed EKG for qtc monitoring qtc - wnl. CSW will continue to work on disposition.  Sanjuana KavaNwoko, Agnes I, NP, PMHNP, FNP-BC. 06/19/2017, 3:14 PMPatient ID: Zachary LeavensJohn C Larin, male   DOB: May 23, 1996, 23 y.o.   MRN: [id number] Agree with NP Progress Note

## 2017-06-19 NOTE — Progress Notes (Signed)
Recreation Therapy Notes  Date: 06/19/17 Time: 1000 Location: 500 Hall Dayroom  Group Topic: Leisure Education  Goal Area(s) Addresses:  Patient will identify positive leisure activities.  Patient will identify one positive benefit of participation in leisure activities.   Behavioral Response: Engaged  Intervention: Dry erase board, dry erase marker, eraser, coffee can with leisure activities on strips of paper  Activity: Pictionary.  Patients were divided into teams of two.  Each team, when it's their turn, would go to the board, draw a slip of paper from the can and draw whatever was on the paper.  That team would have 1 minute to guess the picture.  If the team did not guess the picture, the other team got a chance to steal the point.  The team with the most points wins.  Education:  Leisure Education, Building control surveyorDischarge Planning  Education Outcome: Acknowledges education/In group clarification offered/Needs additional education  Clinical Observations/Feedback: Pt was active and engaged in activity.  Pt was quiet at times but appropriate.   Caroll RancherMarjette Jordell [name], LRT/CTRS         Caroll RancherLindsay, Tavion Senkbeil A 06/19/2017 12:02 PM

## 2017-06-19 NOTE — Tx Team (Signed)
Interdisciplinary Treatment and Diagnostic Plan Update  06/19/2017 Time of Session: 0830AM [name] MRN: [id number]  Principal Diagnosis: Schizoaffective disorder, bipolar type [facility])  Secondary Diagnoses: Principal Problem:   Schizoaffective disorder, bipolar type (HCC)   Current Medications:  Current Facility-Administered Medications  Medication Dose Route Frequency Provider Last Rate Last Dose  . acetaminophen (TYLENOL) tablet 650 mg  650 mg Oral Q6H PRN [name], NP   650 mg at 06/15/17 0306  . alum & mag hydroxide-simeth (MAALOX/MYLANTA) 200-200-20 MG/5ML suspension 30 mL  30 mL Oral Q6H PRN [name], NP      . amoxicillin (AMOXIL) capsule 250 mg  250 mg Oral Q8H Eappen, Saramma, MD   250 mg at 06/19/17 1341  . ARIPiprazole (ABILIFY) tablet 10 mg  10 mg Oral Daily Izediuno, Delight Ovens, MD   10 mg at 06/19/17 0750  . ARIPiprazole (ABILIFY) tablet 5 mg  5 mg Oral QHS [name], MD   5 mg at 06/18/17 2115  . ARIPiprazole ER SRER 400 mg  400 mg Intramuscular Q30 days [name], MD   400 mg at 06/18/17 1608  . benzocaine (ORAJEL) 10 % mucosal gel   Mouth/Throat QID PRN [name], NP      . cloNIDine HCl (KAPVAY) ER tablet 0.1 mg  0.1 mg Oral QHS [name], NP   0.1 mg at 06/18/17 2115  . hydrOXYzine (ATARAX/VISTARIL) tablet 50 mg  50 mg Oral Once [name], PA-C      . ibuprofen (ADVIL,MOTRIN) tablet 600 mg  600 mg Oral Q6H PRN [name], NP   600 mg at 06/17/17 2359  . lamoTRIgine (LAMICTAL) tablet 75 mg  75 mg Oral Daily [name], NP   75 mg at 06/19/17 0750  . magnesium hydroxide (MILK OF MAGNESIA) suspension 30 mL  30 mL Oral Daily PRN [name], NP      . nicotine polacrilex (NICORETTE) gum 2 mg  2 mg Oral PRN [name], NP   2 mg at 06/17/17 2102  . ondansetron (ZOFRAN) tablet 4 mg  4 mg Oral Q8H PRN [name], NP      . traZODone (DESYREL) tablet 175 mg  175 mg Oral QHS [name], MD   175 mg at  06/18/17 2115   PTA Medications: Prescriptions Prior to Admission  Medication Sig Dispense Refill Last Dose  . cloNIDine HCl (KAPVAY) 0.1 MG TB12 ER tablet Take 1 tablet (0.1 mg total) by mouth at bedtime. 30 tablet 0 Past Week at Unknown time  . lamoTRIgine (LAMICTAL) 25 MG tablet Take 3 tablets (75 mg total) by mouth daily. (Patient taking differently: Take 50 mg by mouth daily. ) 90 tablet 0 06/09/2017 at evening  . risperiDONE microspheres (RISPERDAL CONSTA) 25 MG injection Inject 2 mLs (25 mg total) into the muscle every 14 (fourteen) days. Next dose due 03/19/17 1 each 0 Past Month at Unknown time    Patient Stressors: Marital or family conflict Medication change or noncompliance  Patient Strengths: Ability for insight Average or above average intelligence Capable of independent living General fund of knowledge  Treatment Modalities: Medication Management, Group therapy, Case management,  1 to 1 session with clinician, Psychoeducation, Recreational therapy.   Physician Treatment Plan for Primary Diagnosis: Schizoaffective disorder, bipolar type (HCC) Long Term Goal(s): Improvement in symptoms so as ready for discharge Improvement in symptoms so as ready for discharge   Short Term Goals: Ability to identify  changes in lifestyle to reduce recurrence of condition will improve Ability to verbalize feelings will improve Ability to demonstrate self-control will improve Ability to identify and develop effective coping behaviors will improve Compliance with prescribed medications will improve  Medication Management: Evaluate patient's response, side effects, and tolerance of medication regimen.  Therapeutic Interventions: 1 to 1 sessions, Unit Group sessions and Medication administration.  Evaluation of Outcomes: Adequate for Discharge  Physician Treatment Plan for Secondary Diagnosis: Principal Problem:   Schizoaffective disorder, bipolar type (HCC)  Long Term Goal(s):  Improvement in symptoms so as ready for discharge Improvement in symptoms so as ready for discharge   Short Term Goals: Ability to identify changes in lifestyle to reduce recurrence of condition will improve Ability to verbalize feelings will improve Ability to demonstrate self-control will improve Ability to identify and develop effective coping behaviors will improve Compliance with prescribed medications will improve     Medication Management: Evaluate patient's response, side effects, and tolerance of medication regimen.  Therapeutic Interventions: 1 to 1 sessions, Unit Group sessions and Medication administration.  Evaluation of Outcomes: Adequate for Discharge   RN Treatment Plan for Primary Diagnosis: Schizoaffective disorder, bipolar type (HCC) Long Term Goal(s): Knowledge of disease and therapeutic regimen to maintain health will improve  Short Term Goals: Ability to remain free from injury will improve, Ability to verbalize frustration and anger appropriately will improve and Ability to disclose and discuss suicidal ideas  Medication Management: RN will administer medications as ordered by provider, will assess and evaluate patient's response and provide education to patient for prescribed medication. RN will report any adverse and/or side effects to prescribing provider.  Therapeutic Interventions: 1 on 1 counseling sessions, Psychoeducation, Medication administration, Evaluate responses to treatment, Monitor vital signs and CBGs as ordered, Perform/monitor CIWA, COWS, AIMS and Fall Risk screenings as ordered, Perform wound care treatments as ordered.  Evaluation of Outcomes: Adequate for Discharge   LCSW Treatment Plan for Primary Diagnosis: Schizoaffective disorder, bipolar type (HCC) Long Term Goal(s): Safe transition to appropriate next level of care at discharge, Engage patient in therapeutic group addressing interpersonal concerns.  Short Term Goals: Engage patient in  aftercare planning with referrals and resources, Facilitate acceptance of mental health diagnosis and concerns and Identify triggers associated with mental health/substance abuse issues  Therapeutic Interventions: Assess for all discharge needs, 1 to 1 time with Social worker, Explore available resources and support systems, Assess for adequacy in community support network, Educate family and significant other(s) on suicide prevention, Complete Psychosocial Assessment, Interpersonal group therapy.  Evaluation of Outcomes: Adequate for Discharge   Progress in Treatment: Attending groups: Yes. Participating in groups: Yes. Taking medication as prescribed: Yes. Toleration medication: Yes. Family/Significant other contact made: SPE completed with pt; pt declined to consent to family contact.  Patient understands diagnosis: Yes. Discussing patient identified problems/goals with staff: Yes. Medical problems stabilized or resolved: Yes. Denies suicidal/homicidal ideation: Yes. Issues/concerns per patient self-inventory:  Other: n/a   New problem(s) identified: No, Describe:  n/a  New Short Term/Long Term Goal(s): medication management for alcohol detox/depression/mood lability, elimination of SI thoughts, development of comprehensive mental wellness/sobriety plan.   Discharge Plan or Barriers: CSW assessing--interested in returning home and following up at [facility] for outpatient mental health services. MHAG pamphlet and AA list provided for additional community support.   Reason for Continuation of Hospitalization:   Estimated Length of Stay: Likely d/c tomorrow  Attendees: Patient: 06/19/2017 3:37 PM  Physician: Dr. Elna Breslow MD 06/19/2017 3:37 PM  Nursing: Meriam Sprague,  Jan RN 06/19/2017 3:37 PM  RN Care Manager: 06/19/2017 3:37 PM  Social Worker: Richelle Itood Kirrah Mustin 06/19/2017 3:37 PM  Recreational Therapist:  06/19/2017 3:37 PM  Other: Hillery Jacksanika Lewis NP; Feliz Beamravis Money NP 06/19/2017 3:37 PM  Other:  06/19/2017 3:37 PM   Other: 06/19/2017 3:37 PM    Scribe for Treatment Team: Ida Rogueodney B Masiyah Engen, LCSW 06/19/2017 3:37 PM

## 2017-06-20 MED ORDER — CLONIDINE HCL ER 0.1 MG PO TB12: 0.1000 mg | ORAL_TABLET | Freq: Every day | ORAL | 0 refills | Status: DC

## 2017-06-20 MED ORDER — HYDROXYZINE HCL 50 MG PO TABS
50.0000 mg | ORAL_TABLET | Freq: Once | ORAL | Status: AC
  Administered 2017-06-20: 50 mg via ORAL
  Filled 2017-06-20 (×2): qty 1

## 2017-06-20 MED ORDER — ARIPIPRAZOLE 5 MG PO TABS: ORAL_TABLET | ORAL | 0 refills | Status: DC

## 2017-06-20 MED ORDER — OLANZAPINE 5 MG PO TBDP
5.0000 mg | ORAL_TABLET | Freq: Once | ORAL | Status: DC
  Filled 2017-06-20 (×2): qty 1

## 2017-06-20 MED ORDER — TRAZODONE HCL 150 MG PO TABS: 175.0000 mg | ORAL_TABLET | Freq: Every day | ORAL | 0 refills | Status: DC

## 2017-06-20 MED ORDER — TRAZODONE HCL 150 MG PO TABS
150.0000 mg | ORAL_TABLET | Freq: Every day | ORAL | Status: DC
  Filled 2017-06-20: qty 7

## 2017-06-20 MED ORDER — AMOXICILLIN 250 MG PO CAPS
250.0000 mg | ORAL_CAPSULE | Freq: Three times a day (TID) | ORAL | Status: DC
Start: 2017-06-20 — End: 2017-10-20

## 2017-06-20 MED ORDER — BENZOCAINE 10 % MT GEL: Freq: Four times a day (QID) | OROMUCOSAL | 0 refills | Status: DC | PRN

## 2017-06-20 MED ORDER — LAMOTRIGINE 25 MG PO TABS: 75.0000 mg | ORAL_TABLET | Freq: Every day | ORAL | 0 refills | Status: DC

## 2017-06-20 MED ORDER — ARIPIPRAZOLE 5 MG PO TABS
5.0000 mg | ORAL_TABLET | Freq: Every day | ORAL | Status: DC
  Filled 2017-06-20 (×2): qty 21

## 2017-06-20 MED ORDER — ARIPIPRAZOLE ER 400 MG IM SRER: 400.0000 mg | INTRAMUSCULAR | 0 refills | Status: DC

## 2017-06-20 NOTE — Plan of Care (Signed)
Problem: [facility] Participation in Recreation Therapeutic Interventions Goal: STG-Patient will attend/participate in Rec Therapy Group Ses STG-The Patient will attend and participate in Recreation Therapy Group Sessions  Outcome: Completed/Met Date Met: 06/20/17 Pt attended and participated in team building, leisure education, communication, self esteem and coping skills recreation therapy sessions.   [name], LRT/CTRS

## 2017-06-20 NOTE — [facility] Suicide Risk Assessment (Signed)
[facility]   Principal Problem: Schizoaffective disorder, bipolar type [facility](HCC) Discharge Diagnoses:  Patient Active Problem List   Diagnosis Date Noted  . Schizoaffective disorder, bipolar type (HCC) [F25.0] 02/26/2017  . 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 Cannabis use disorder, severe, dependence (HCC) [F12.20] 10/21/2014    Total Time spent with patient: 30 minutes  Musculoskeletal: Strength & Muscle Tone: within normal limits Gait & Station: normal Patient leans: N/A  Psychiatric Specialty Exam: ROS no headache, no visual disturbances, no chest pain, no shortness of breath, no vomiting   Blood pressure (!) 157/90, pulse 68, temperature 98 F (36.7 C), temperature source Oral, resp. rate 16, height 6\' 4"  (1.93 m), weight 106.6 kg (235 lb).Body mass index is 28.61 kg/m.  General Appearance: Fairly Groomed- holds hand over mouth most of the time, when asked about this he states that he is bothered by cold air on his teeth. States that " when I am outside it does not bother me".  Eye Contact::  Good  Speech:  Normal Rate409  Volume:  Decreased  Mood:  denies depression, states mood "OK", describes as 8/10  Affect:  blunted   Thought Process:  Linear and Descriptions of Associations: Circumstantial  Orientation:  Full (Time, Place, and Person)  Thought Content:  denies hallucinations, no delusions, not internally preoccupied   Suicidal Thoughts:  No denies any suicidal or self injurious ideations, no homicidal or violent ideations   Homicidal Thoughts:  No  Memory:  recent and remote grossly intact   Judgement:  Fair- improving   Insight:  Fair- improving   Psychomotor Activity:  Normal  Concentration:  Good  Recall:  Good  Fund of Knowledge:Good  Language: Fair  Akathisia:  No  Handed:  Right  AIMS (if indicated):     Assets:  Desire for Improvement Resilience   Sleep:  Number of Hours: 3.5  Cognition: WNL  ADL's:  improving   Mental Status Per Nursing Assessment::   On Admission:     Demographic Factors:  21 year old single male, no children, currently living with brother, at times with mother.   Loss Factors: Chronic mental illness, relapse   Historical Factors: Patient has history of prior psychiatric admissions- has been diagnosed with Schizoaffective Disorder, History of Cannabis Use Disorder   Risk Reduction Factors:   Sense of responsibility to family, Living with another person, especially a relative and Positive coping skills or problem solving skills  Continued Clinical Symptoms:  At this time patient is alert, attentive, calm, cooperative with interview. Reports he is feeling " all right" denies depression, and expresses readiness for discharge. Affect blunted . Thought process linear at this time, somewhat concrete. Denies suicidal or self injurious ideations, no homicidal ideations , denies hallucinations,no delusions expressed . Does not appear internally preoccupied at this time. No overtly disruptive or agitated behaviors on unit, limited interaction with peers, polite and cooperative on approach today. Denies medication side effects, and expresses he likes the Abilify he is on currently better than prior medication trials .   Cognitive Features That Contribute To Risk:  No gross cognitive deficits noted upon discharge. Is alert , attentive, and oriented x 3    Suicide Risk:  Mild:  Suicidal ideation of limited frequency, intensity, duration, and specificity.  There are no identifiable plans, no associated intent, mild dysphoria and related symptoms, good self-control (both objective and subjective assessment), few other risk factors,  and identifiable protective factors, including available and accessible social support.  Follow-up Information    [facility] Follow up on 06/24/2017.   Why:  Tuesday at 10AM for your hospital  follow up appointment with Dr Jacqualine Code have signed up for services with [facility].  You can reach [employer] at [phone]  Contact information: [address] [place] [address] [phone]           Plan Of Care/Follow-up recommendations:  Activity:  as tolerated  Diet:  Regular Tests:  NA Other:  See below   Patient is expressing desire and readiness for discharge and there are no current grounds for involuntary commitment  He plans to go live with his brother Follow up as above   Craige Cotta, MD 06/20/2017, 1:37 PM

## 2017-06-20 NOTE — Progress Notes (Signed)
Recreation Therapy Notes  Date: 06/20/17 Time: 1000 Location: 500 Hall Dayroom  Group Topic: Communication, Team Building, Problem Solving  Goal Area(s) Addresses:  Patient will effectively work with peers towards shared goal.  Patient will identify skills used to make activity successful.  Patient will identify how skills used during activity can be used to reach post d/c goals.   Behavioral Response: Engaged  Intervention: STEM Activity  Activity: Straw Bridge.  Patients were divided into groups of four depending on the size of the group.  Each group was given 20 straws and 24 inches of masking tape.  Patients were instructed to build [name] free standing bridge using all of the supplies they were given.  Once finished, the bridge should be able to hold [name] small hard cover book or small puzzle set.  Education: Pharmacist, communityocial Skills, Discharge Planning   Education Outcome: Acknowledges education/In group clarification offered/Needs additional education.   Clinical Observations/Feedback: Pt was very engaged in helping the group develop [name] plan.  Pt stated he could the skills from the activity to help him set goals for himself.    Zachary RancherMarjette Hristopher [name], [name]         Zachary RancherLindsay, [name] 06/20/2017 11:41 AM

## 2017-06-20 NOTE — Progress Notes (Signed)
  [facility] Discharge [facility] :  Will you be returning to the same living situation after discharge:  Yes,  home At discharge, do you have transportation home?: Yes,  bus pass Do you have the ability to pay for your medications: Yes,  mental health  Release of information consent forms completed and in the chart;  Patient's signature needed at discharge.  Patient to Follow up at: Follow-up Information    [facility] Follow up on 06/24/2017.   Why:  Tuesday at 10AM for your hospital follow up appointment with Dr Jacqualine CodeM  You [facility].  You can reach [employer] at [phone]  Contact information: [address] [place] [address] [phone]           Next level of care provider has access to [facility] Link:no  Safety Planning and Suicide Prevention discussed: Yes,  yes  Have you used any form of tobacco in the last 30 days? (Cigarettes, Smokeless Tobacco, Cigars, and/or Pipes): Yes  Has patient been referred to the Quitline?: Patient refused referral  Patient has been referred for addiction treatment: Yes  [name] B Zachary Tweed, LCSW 06/20/2017, 11:09 AM

## 2017-06-20 NOTE — Progress Notes (Signed)
Pt discharged home on a bus pass. Pt was ambulatory, stable and appreciative at that time. All papers and prescriptions were given and valuables returned. Verbal understanding expressed. Denies SI/HI and A/VH. Pt given opportunity to express concerns and ask questions.  

## 2017-06-20 NOTE — Progress Notes (Signed)
Recreation Therapy Notes  INPATIENT RECREATION TR PLAN  Patient Details Name: [name] MRN: [id number] DOB: 1996-08-26 Today's Date: 06/20/2017  Rec Therapy Plan Is patient appropriate for Therapeutic Recreation?: Yes Treatment times per week: about 3 days Estimated Length of Stay: 5-7 days TR Treatment/Interventions: Group participation (Comment)  Discharge Criteria Pt will be discharged from therapy if:: Discharged Treatment plan/goals/alternatives discussed and agreed upon by:: Patient/family  Discharge Summary Short term goals set: Patient will attend and participate in Recreation Therapy Group Sessions  Short term goals met: Complete Progress toward goals comments: Groups attended Which groups?: Self-esteem, Coping skills, Leisure education, Communication, Other (Comment) (Team building) Reason goals not met: None Therapeutic equipment acquired: N/A Reason patient discharged from therapy: Discharge from hospital Pt/family agrees with progress & goals achieved: Yes Date patient discharged from therapy: 06/20/17   Victorino Sparrow, LRT/CTRS  Ria Comment, [place] 06/20/2017, 12:24 PM

## 2017-06-20 NOTE — Progress Notes (Signed)
D: Pt observed pacing on the hallway with blanket over his mouth. Pt however denied anxiety or hallucinations; "I do this even when I'm at home." Pt also denied Pt SI, HI, depression or pain. Pt made minimal interaction. A: Medications offered as prescribed. Pt offered the opportunity to ask questions and state concerns. Support, encouragement, and safe environment provided. Will continue to monitor for any changes. 15-minute safety checks continue. R: Pt was med compliant. All patient's questions and concerns addressed. Pt attended wrap-up group. Safety checks continue.

## 2017-06-20 NOTE — Discharge Summary (Signed)
Physician Discharge Summary Note  Patient:  [name] is an 21 y.o., male MRN:  [id number] DOB:  10-19-1995 Patient phone:  [phone] (home)  Patient address:   [address] [place] [address],  Total Time spent with patient: Greater than 30 minutes  Date of Admission:  06/13/2017 Date of Discharge: 06/20/2017  Reason for Admission: Aggressive behavior & psychosis.  Principal Problem: Schizoaffective disorder, bipolar type [facility]) Discharge Diagnoses: Patient Active Problem List   Diagnosis Date Noted  . Schizoaffective disorder, bipolar type (HCC) [F25.0] 02/26/2017    Priority: High  . 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 Cannabis use disorder, severe, dependence (HCC) [F12.20] 10/21/2014   Past Psychiatric History: Schizoaffective disorder, Bipolar-type, Cannabis use disorder.  Past Medical History:  Past Medical History:  Diagnosis Date  . ADHD (attention deficit hyperactivity disorder)   . Schizophrenia (HCC)    History reviewed. No pertinent surgical history.  Family History:  Family History  Problem Relation Age of Onset  . Bipolar disorder Mother   . Diabetes Mother   . Hypertension Mother    Family Psychiatric  History: See H&P  Social History:  History  Alcohol Use No    Comment: denies alcohol usag     History  Drug Use  . Types: Marijuana    Comment: everyday    Social History   Social History  . Marital status: Single    Spouse name: N/A  . Number of children: N/A  . Years of education: N/A   Social History Main Topics  . Smoking status: Current Every Day Smoker    Packs/day: 0.25    Types: Cigarettes  . Smokeless tobacco: Never Used  . Alcohol use No     Comment: denies alcohol usag  . Drug use: Yes    Types: Marijuana     Comment: everyday  . Sexual activity: Yes    Birth control/ protection: Condom   Other Topics  Concern  . None   Social History Narrative  . None   Hospital Course:This is one of several admission assessments for this 21 year old African-American male with hx of mental illness, chronic & cannabis use disorder, dependence, severe.  He was discharged from this [facility] hospital last may, 2018 after receiving mood stabilization treatments. He is receiving his mental health care & medication management at the [facility] here in [place], [place].  During this assessment, [name] reports, "I don't really know why I came to the hospital. I was on the phone with someone. I took some of my medicines, some I don't because I don't need them, like the sleep medicine. I was at [facility] last week. They changed my Abilify from shots to pills. They put me on the pill. I take the pill. I don't know why I got to come back to the hospital. I've been doing good. I sleep good".    After the above admission assessment, [name] was started on the medication regimen for his presenting symptoms. He was medicated & discharged on; Abilify 5 mg for mood control, Abilify ER 400 mg IM Q 30 days for mood control, Lamictal 75 mg for mood stabilization & Trazodone 150 mg for insomnia. He was also enrolled & participated in the group counseling sessions being offered & held on this unit. He learned coping skills that should help him after discharge to cope better.  As [name]'s treatment progressed, daily  assessment notes marked improvement in his symptoms. He still talks to himself sometimes, only when he is alone. This is likely his baseline. He says he feels good. No craving for cannabis. No anxiety. Features of depression has markedly improved since starting medications. No thoughts of violence. No access to weapons. No new stressors.    [name]'s case was presented during treatment team meeting this morning. The nursing staff reports that patient has been appropriate on the unit. No behavioral issues. Patient has not voiced any suicidal  thoughts. Patient has been observed to be minimally internally stimulated or preoccupied. Patient has been adherent with treatment recommendations. Patient has been tolerating their medication well.   During care review & discussion of his progress this morning at the treatment team meeting. Team members feels that patient is back to his baseline level of function. Team agrees with plan to discharge patient today to continue mental health care on an outpatient basis. He is provided with all the necessary information needed to make this appointment without any problems. [facility] in no apparent distress. He was provided with a 7 days worth, supply samples of [facility]. Transportation per city bus. [facility] assisted with bus pass.    Physical Findings: AIMS: Facial and Oral Movements Muscles of Facial Expression: None, normal Lips and Perioral Area: None, normal Jaw: None, normal Tongue: None, normal,Extremity Movements Upper (arms, wrists, hands, fingers): None, normal Lower (legs, knees, ankles, toes): None, normal, Trunk Movements Neck, shoulders, hips: None, normal, Overall Severity Severity of abnormal movements (highest score from questions above): None, normal Incapacitation due to abnormal movements: None, normal Patient's awareness of abnormal movements (rate only patient's report): No Awareness, Dental Status Current problems with teeth and/or dentures?: Yes Does patient usually wear dentures?: No  CIWA:  CIWA-Ar Total: 1 COWS:  COWS Total Score: 2  Musculoskeletal: Strength & Muscle Tone: within normal limits Gait & Station: normal Patient leans: N/A  Psychiatric Specialty Exam: Physical Exam  Nursing note and vitals reviewed. Constitutional: He appears well-developed.  HENT:  Head: Normocephalic.  Eyes: Pupils are equal, round, and reactive to light.  Neck: Normal range of motion.  Cardiovascular: Normal rate.   Respiratory:  Effort normal.  GI: Soft.  Genitourinary:  Genitourinary Comments: Deferred  Musculoskeletal: Normal range of motion.  Neurological: He is alert.  Skin: Skin is warm.    Review of Systems  Constitutional: Negative.   HENT: Negative.   Eyes: Negative.   Respiratory: Negative.   Cardiovascular: Negative.   Gastrointestinal: Negative.   Genitourinary: Negative.   Musculoskeletal: Negative.   Skin: Negative.   Endo/Heme/Allergies: Negative.   Psychiatric/Behavioral: Positive for depression (Stable), hallucinations (Hx. hallucinations) and substance abuse (Hx. THC use disorder). Negative for memory loss and suicidal ideas. The patient has insomnia (Stable). The patient is not nervous/anxious.     Blood pressure (!) 157/90, pulse 68, temperature 98 F (36.7 C), temperature source Oral, resp. rate 16, height 6\' 4"  (1.93 m), weight 106.6 kg (235 lb).Body mass index is 28.61 kg/m.   Have you used any form of tobacco in the last 30 days? (Cigarettes, Smokeless Tobacco, Cigars, and/or Pipes): Yes  Has this patient used any form of tobacco in the last 30 days? (Cigarettes, Smokeless Tobacco, Cigars, and/or Pipes): N/A  Blood Alcohol level:  Lab Results  Component Value Date   [facility] <5 06/11/2017   ETH <5 03/25/2017   Metabolic Disorder Labs:  Lab Results  Component Value Date  HGBA1C 5.4 06/16/2017   MPG 108.28 06/16/2017   MPG 103 02/27/2017   Lab Results  Component Value Date   PROLACTIN 2.3 (L) 06/16/2017   Lab Results  Component Value Date   CHOL 156 06/16/2017   TRIG 75 06/16/2017   HDL 47 06/16/2017   CHOLHDL 3.3 06/16/2017   VLDL 15 06/16/2017   LDLCALC 94 06/16/2017   LDLCALC 109 (H) 02/27/2017   See Psychiatric Specialty Exam and Suicide Risk Assessment completed by Attending Physician prior to discharge.  Discharge destination:  Home  Is patient on multiple antipsychotic therapies at discharge:  No   Has Patient had three or more failed trials of antipsychotic  monotherapy by history:  No  Recommended Plan for Multiple Antipsychotic Therapies: NA  Allergies as of 06/20/2017      Reactions   Morphine And Related Hives   Per mother       Medication List    STOP taking these medications   risperiDONE microspheres 25 MG injection Commonly known as:  RISPERDAL CONSTA     TAKE these medications     Indication  amoxicillin 250 MG capsule Commonly known as:  AMOXIL Take 1 capsule (250 mg total) by mouth every 8 (eight) hours. For infection  Indication:  Infection   ARIPiprazole 5 MG tablet Commonly known as:  ABILIFY Take 1 tablet (5 mg) in the morning & 2 tablets (10 mg) at bedtime: For mood control  Indication:  Mood control   ARIPiprazole ER 400 MG Srer Inject 400 mg into the muscle every 30 (thirty) days. (Due on 07-18-17): For mood control  Indication:  Mood control   benzocaine 10 % mucosal gel Commonly known as:  ORAJEL Use as directed in the mouth or throat 4 (four) times daily as needed for mouth pain.  Indication:  Mouth pain   cloNIDine HCl 0.1 MG Tb12 ER tablet Commonly known as:  KAPVAY Take 1 tablet (0.1 mg total) by mouth at bedtime. For high blood pressure What changed:  additional instructions  Indication:  Attention Deficit Hyperactivity Disorder   lamoTRIgine 25 MG tablet Commonly known as:  LAMICTAL Take 3 tablets (75 mg total) by mouth daily. For mood stabilization What changed:  additional instructions  Indication:  Mood stabilization   traZODone 150 MG tablet Commonly known as:  DESYREL Take 1 tablet (150 mg total) by mouth at bedtime. For sleep  Indication:  Trouble Sleeping      Follow-up Information    [facility] Follow up on 06/24/2017.   Why:  Tuesday at 10AM for your hospital follow up appointment with Dr Jacqualine Code have signed up for services with [facility].  You can reach [employer] at [phone]  Contact information: [address] [place] [address] [phone]           Follow-up recommendations: Activity:  As tolerated Diet: As recommended by your primary care doctor. Keep all scheduled follow-up appointments as recommended.   Comments: Patient is instructed prior to discharge to: Take all medications as prescribed by his/her mental healthcare provider. Report any adverse effects and or reactions from the medicines to his/her outpatient provider promptly. Patient has been instructed & cautioned: To not engage in alcohol and or illegal drug use while on prescription medicines. In the event of worsening symptoms, patient is instructed to call the crisis hotline, 911 and or go to the nearest ED for appropriate evaluation and treatment of symptoms. To follow-up with his/her primary care provider  for your other medical issues, concerns and or health care needs.   Signed: Sanjuana Kava, NP PMHNP, FNP_BC 06/20/2017, 11:01 AM   Patient seen, Suicide Assessment Completed.  Disposition Plan Reviewed

## 2017-06-24 ENCOUNTER — Emergency Department (HOSPITAL_COMMUNITY)
Admission: EM | Admit: 2017-06-24 | Discharge: 2017-06-26 | Disposition: A | Discharge status: Home / Self Care | Payer: Medicaid Other | Attending: Emergency Medicine | Admitting: Emergency Medicine

## 2017-06-24 ENCOUNTER — Encounter (HOSPITAL_COMMUNITY): Payer: Self-pay | Admitting: Emergency Medicine

## 2017-06-24 DIAGNOSIS — F250 Schizoaffective disorder, bipolar type: Secondary | ICD-10-CM | POA: Diagnosis not present

## 2017-06-24 DIAGNOSIS — F17210 Nicotine dependence, cigarettes, uncomplicated: Secondary | ICD-10-CM | POA: Insufficient documentation

## 2017-06-24 DIAGNOSIS — Z79899 Other long term (current) drug therapy: Secondary | ICD-10-CM | POA: Diagnosis not present

## 2017-06-24 LAB — CBC
HEMATOCRIT: 44.6 % (ref 39.0–52.0)
Hemoglobin: 15.5 g/dL (ref 13.0–17.0)
MCH: 32 pg (ref 26.0–34.0)
MCHC: 34.8 g/dL (ref 30.0–36.0)
MCV: 92.1 fL (ref 78.0–100.0)
PLATELETS: 222 10*3/uL (ref 150–400)
RBC: 4.84 MIL/uL (ref 4.22–5.81)
RDW: 13.7 % (ref 11.5–15.5)
WBC: 10.3 10*3/uL (ref 4.0–10.5)

## 2017-06-24 LAB — COMPREHENSIVE METABOLIC PANEL
ALT: 74 U/L — ABNORMAL HIGH (ref 17–63)
AST: 56 U/L — ABNORMAL HIGH (ref 15–41)
Albumin: 4.4 g/dL (ref 3.5–5.0)
Alkaline Phosphatase: 56 U/L (ref 38–126)
Anion gap: 10 (ref 5–15)
BILIRUBIN TOTAL: 0.7 mg/dL (ref 0.3–1.2)
BUN: 13 mg/dL (ref 6–20)
CALCIUM: 9.6 mg/dL (ref 8.9–10.3)
CO2: 26 mmol/L (ref 22–32)
CREATININE: 1.11 mg/dL (ref 0.61–1.24)
Chloride: 104 mmol/L (ref 101–111)
Glucose, Bld: 107 mg/dL — ABNORMAL HIGH (ref 65–99)
Potassium: 3.8 mmol/L (ref 3.5–5.1)
Sodium: 140 mmol/L (ref 135–145)
TOTAL PROTEIN: 7.3 g/dL (ref 6.5–8.1)

## 2017-06-24 LAB — SALICYLATE LEVEL: Salicylate Lvl: <7 mg/dL (ref 2.8–30.0)

## 2017-06-24 LAB — ETHANOL

## 2017-06-24 LAB — ACETAMINOPHEN LEVEL: Acetaminophen (Tylenol), Serum: <10 ug/mL — ABNORMAL LOW (ref 10–30)

## 2017-06-24 MED ORDER — CLONIDINE HCL ER 0.1 MG PO TB12
0.1000 mg | ORAL_TABLET | Freq: Every day | ORAL | Status: DC
  Administered 2017-06-24 – 2017-06-25 (×2): 0.1 mg via ORAL
  Filled 2017-06-24 (×3): qty 1

## 2017-06-24 MED ORDER — TRAZODONE HCL 50 MG PO TABS
175.0000 mg | ORAL_TABLET | Freq: Every day | ORAL | Status: DC
  Administered 2017-06-24: 23:00:00 175 mg via ORAL
  Filled 2017-06-24: qty 1

## 2017-06-24 MED ORDER — LAMOTRIGINE 25 MG PO TABS
75.0000 mg | ORAL_TABLET | Freq: Every day | ORAL | Status: DC
  Administered 2017-06-24 – 2017-06-26 (×3): 75 mg via ORAL
  Filled 2017-06-24 (×3): qty 3
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
## 2017-06-24 NOTE — BHH Counselor (Signed)
Pt IVC'd by his mother, clinician received verbal consent from pt to speak to his mother for collateral information. Clinician called pt's mother Rometta Emery(Julia Little, [phone].) and the cal ended. Clinician was unable to leave a HIPPA voice message.   Redmond Pullingreylese D Lita Flynn, MS, [facility], [facility] Triage Specialist [phone]

## 2017-06-24 NOTE — ED Notes (Signed)
Bed: [facility] Expected date:  Expected time:  Means of arrival:  Comments: 

## 2017-06-24 NOTE — ED Notes (Signed)
Bed: [facility]36 Expected date:  Expected time:  Means of arrival:  Comments: TR 3 
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
## 2017-06-25 MED ORDER — TRAZODONE HCL 100 MG PO TABS
200.0000 mg | ORAL_TABLET | Freq: Every day | ORAL | Status: DC
  Administered 2017-06-25: 200 mg via ORAL
  Filled 2017-06-25: qty 2

## 2017-06-25 MED ORDER — ARIPIPRAZOLE 5 MG PO TABS
5.0000 mg | ORAL_TABLET | Freq: Every day | ORAL | Status: DC
  Administered 2017-06-25 – 2017-06-26 (×2): 5 mg via ORAL
  Filled 2017-06-25 (×2): qty 1

## 2017-06-25 NOTE — Progress Notes (Signed)
Pt asleep in bed at this time, respirations noted and unlabored. Pt presents with blunted affect and irritable mood on approach. Defensive and argumentative when asked about urine sample "why do you want it, no, I don't have to give you any urine, the doctor told me I'm across the street any ways". Pt refused to drink all PO fluids when offered to avoid urinating, "I'm fine, I'll be alright, I don't want to drink, I said I'm fine". Compliant with medications when offered without fluids. Mouth checks done. Encouraged to voice concerns. Support and availability offered to pt. Pt appears to be in no physical distress. Routine safety checks maintained.

## 2017-06-25 NOTE — Progress Notes (Signed)
06/25/17 1358:  LRT went to pt room to offer activities, pt was sleep.   Caroll RancherMarjette Lahoma Constantin, LRT/CTRS

## 2017-06-25 NOTE — ED Notes (Signed)
Pt A&O x 3, no distress noted, calm & cooperative, talking on phone at present.  Monitoring for safety, Q 15 min checks in effect.

## 2017-06-25 NOTE — BH Assessment (Addendum)
BHH Assessment Progress Note  Per Mojeed Akintayo, MD, this pt requires psychiatric hospitalization at this time.  The following facilities have been contacted to seek placement for this pt, with results as noted:  Beds available, information sent, decision pending:  [facility]   At capacity:  [facility]   Amarachukwu Lakatos, MA Triage Specialist [phone]     

## 2017-06-26 DIAGNOSIS — Z79899 Other long term (current) drug therapy: Secondary | ICD-10-CM

## 2017-06-26 DIAGNOSIS — Z9114 Patient's other noncompliance with medication regimen: Secondary | ICD-10-CM

## 2017-06-26 DIAGNOSIS — F1290 Cannabis use, unspecified, uncomplicated: Secondary | ICD-10-CM

## 2017-06-26 DIAGNOSIS — Z818 Family history of other mental and behavioral disorders: Secondary | ICD-10-CM

## 2017-06-26 DIAGNOSIS — F17210 Nicotine dependence, cigarettes, uncomplicated: Secondary | ICD-10-CM

## 2017-06-26 DIAGNOSIS — F250 Schizoaffective disorder, bipolar type: Secondary | ICD-10-CM
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
## 2017-06-26 NOTE — BH Assessment (Signed)
[facility]   06/26/2017; Per Dr. Jannifer FranklinAkintayo and Elta GuadeloupeLaurie Parks, DNP, patient referred to discharged. Dr. Jannifer FranklinAkintayo psychiatric cleared patient. [facility].

## 2017-06-26 NOTE — [facility] Suicide Risk Assessment (Signed)
Suicide Risk Assessment  Discharge Assessment   [facility]   Principal Problem: Schizoaffective disorder, bipolar type [facility](HCC) Discharge Diagnoses:  Patient Active Problem List   Diagnosis Date Noted  . Schizoaffective disorder, bipolar type (HCC) [F25.0] 02/26/2017    Priority: High  . Cannabis use disorder, severe, dependence (HCC) [F12.20] 10/21/2014    Total Time spent with patient: 45 minutes  Musculoskeletal: Strength & Muscle Tone: within normal limits Gait & Station: normal Patient leans: N/A  Psychiatric Specialty Exam: Physical Exam  Constitutional: He is oriented to person, place, and time. He appears well-developed and well-nourished.  HENT:  Head: Normocephalic.  Neck: Normal range of motion.  Respiratory: Effort normal.  Musculoskeletal: Normal range of motion.  Neurological: He is alert and oriented to person, place, and time.  Psychiatric: He has a normal mood and affect. His behavior is normal. Judgment normal. Cognition and memory are normal.    Review of Systems  All other systems reviewed and are negative.   Blood pressure (!) 117/58, pulse (!) 51, temperature 98.4 F (36.9 C), temperature source Oral, resp. rate 18, height 6\' 4"  (1.93 m), weight 112.4 kg (247 lb 11.2 oz), SpO2 97 %.Body mass index is 30.15 kg/m.  General Appearance: Casual  Eye Contact:  Good  Speech:  Normal Rate  Volume:  Normal  Mood:  Euthymic  Affect:  Blunt  Thought Process:  Coherent and Descriptions of Associations: Intact  Orientation:  Full (Time, Place, and Person)  Thought Content:  WDL and Logical  Suicidal Thoughts:  No  Homicidal Thoughts:  No  Memory:  Immediate;   Good Recent;   Good Remote;   Good  Judgement:  Fair  Insight:  Fair  Psychomotor Activity:  Normal  Concentration:  Concentration: Good and Attention Span: Good  Recall:  Good  Fund of Knowledge:  Fair  Language:  Good  Akathisia:  No  Handed:  Right  AIMS (if  indicated):     Assets:  Leisure Time Physical Health Resilience Social Support  ADL's:  Intact  Cognition:  WNL  Sleep:       Mental Status Per Nursing Assessment::   On Admission:   substance abuse with aggression  Demographic Factors:  Male and Adolescent or young adult  Loss Factors: NA  Historical Factors: NA  Risk Reduction Factors:   Sense of responsibility to family, Living with another person, especially a relative, Positive social support and Positive therapeutic relationship  Continued Clinical Symptoms:  None  Cognitive Features That Contribute To Risk:  None    Suicide Risk:  Minimal: No identifiable suicidal ideation.  Patients presenting with no risk factors but with morbid ruminations; may be classified as minimal risk based on the severity of the depressive symptoms    Plan Of Care/Follow-up recommendations:  Activity:  as tolerated Diet:  heart healthy diet  LORD, JAMISON, NP 06/26/2017, 5:19 PM

## 2017-06-26 NOTE — [facility] Counselor (Signed)
06/26/2017 - attempted to contact mother/guardian, left voice-mail asking to return call.

## 2017-06-26 NOTE — [facility] Counselor (Signed)
06/26/2017 (11:45am) - Attempted to contact patient's grandmother Dahlia Byes(Dorothy Mangas) to inquire if she knew how to contact the patient's mother/guardian concerning discharged. No answer, the phone just rang, unable to leave a message.

## 2017-06-26 NOTE — ED Notes (Signed)
Pt discharged safely with resources.  All belongings were returned to patient.  Pt was calm and cooperative at discharge.  Bus pass was given.

## 2017-10-20 ENCOUNTER — Other Ambulatory Visit: Payer: Self-pay

## 2017-10-20 ENCOUNTER — Encounter (HOSPITAL_COMMUNITY): Payer: Self-pay | Admitting: Emergency Medicine

## 2017-10-20 ENCOUNTER — Emergency Department (HOSPITAL_COMMUNITY)
Admission: EM | Admit: 2017-10-20 | Discharge: 2017-10-21 | Disposition: A | Discharge status: Other Acute Inpatient Hospital | Payer: Medicaid Other | Attending: Emergency Medicine | Admitting: Emergency Medicine

## 2017-10-20 DIAGNOSIS — F209 Schizophrenia, unspecified: Secondary | ICD-10-CM | POA: Insufficient documentation

## 2017-10-20 DIAGNOSIS — F1220 Cannabis dependence, uncomplicated: Secondary | ICD-10-CM | POA: Insufficient documentation

## 2017-10-20 DIAGNOSIS — F250 Schizoaffective disorder, bipolar type: Secondary | ICD-10-CM | POA: Diagnosis present

## 2017-10-20 DIAGNOSIS — Z79899 Other long term (current) drug therapy: Secondary | ICD-10-CM | POA: Insufficient documentation

## 2017-10-20 DIAGNOSIS — F17210 Nicotine dependence, cigarettes, uncomplicated: Secondary | ICD-10-CM | POA: Insufficient documentation

## 2017-10-20 LAB — COMPREHENSIVE METABOLIC PANEL
ALK PHOS: 53 U/L (ref 38–126)
ALT: 21 U/L (ref 17–63)
AST: 29 U/L (ref 15–41)
Albumin: 4.6 g/dL (ref 3.5–5.0)
Anion gap: 8 (ref 5–15)
BUN: 16 mg/dL (ref 6–20)
CALCIUM: 8.9 mg/dL (ref 8.9–10.3)
CO2: 22 mmol/L (ref 22–32)
CREATININE: 0.96 mg/dL (ref 0.61–1.24)
Chloride: 108 mmol/L (ref 101–111)
GFR calc non Af Amer: >60 mL/min (ref >60)
Glucose, Bld: 104 mg/dL — ABNORMAL HIGH (ref 65–99)
Potassium: 3.6 mmol/L (ref 3.5–5.1)
Sodium: 138 mmol/L (ref 135–145)
TOTAL PROTEIN: 7.3 g/dL (ref 6.5–8.1)
Total Bilirubin: 0.7 mg/dL (ref 0.3–1.2)

## 2017-10-20 LAB — CBC
HCT: 45.2 % (ref 39.0–52.0)
Hemoglobin: 15.6 g/dL (ref 13.0–17.0)
MCH: 31.6 pg (ref 26.0–34.0)
MCHC: 34.5 g/dL (ref 30.0–36.0)
MCV: 91.7 fL (ref 78.0–100.0)
Platelets: 224 10*3/uL (ref 150–400)
RBC: 4.93 MIL/uL (ref 4.22–5.81)
RDW: 13.2 % (ref 11.5–15.5)
WBC: 8.4 10*3/uL (ref 4.0–10.5)

## 2017-10-20 LAB — ETHANOL: Alcohol, Ethyl (B): <10 mg/dL (ref <10)

## 2017-10-20 LAB — ACETAMINOPHEN LEVEL: Acetaminophen (Tylenol), Serum: <10 ug/mL — ABNORMAL LOW (ref 10–30)

## 2017-10-20 LAB — SALICYLATE LEVEL
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
## 2017-10-20 NOTE — ED Notes (Signed)
Bed: WTR5 Expected date:  Expected time:  Means of arrival:  Comments: 

## 2017-10-21 ENCOUNTER — Encounter (HOSPITAL_COMMUNITY): Payer: Self-pay

## 2017-10-21 ENCOUNTER — Inpatient Hospital Stay (HOSPITAL_COMMUNITY)
Admission: AD | Admit: 2017-10-21 | Discharge: 2017-10-28 | DRG: 885 | Disposition: A | Discharge status: Home / Self Care | Payer: Medicaid Other | Source: Intra-hospital | Attending: Psychiatry | Admitting: Psychiatry

## 2017-10-21 DIAGNOSIS — F17210 Nicotine dependence, cigarettes, uncomplicated: Secondary | ICD-10-CM | POA: Diagnosis present

## 2017-10-21 DIAGNOSIS — F1220 Cannabis dependence, uncomplicated: Secondary | ICD-10-CM | POA: Diagnosis not present

## 2017-10-21 DIAGNOSIS — Z23 Encounter for immunization: Secondary | ICD-10-CM

## 2017-10-21 DIAGNOSIS — Z79899 Other long term (current) drug therapy: Secondary | ICD-10-CM | POA: Diagnosis not present

## 2017-10-21 DIAGNOSIS — F419 Anxiety disorder, unspecified: Secondary | ICD-10-CM | POA: Diagnosis not present

## 2017-10-21 DIAGNOSIS — G4700 Insomnia, unspecified: Secondary | ICD-10-CM | POA: Diagnosis not present

## 2017-10-21 DIAGNOSIS — F250 Schizoaffective disorder, bipolar type: Principal | ICD-10-CM | POA: Diagnosis present

## 2017-10-21 DIAGNOSIS — R4689 Other symptoms and signs involving appearance and behavior: Secondary | ICD-10-CM | POA: Diagnosis present

## 2017-10-21 DIAGNOSIS — F1290 Cannabis use, unspecified, uncomplicated: Secondary | ICD-10-CM

## 2017-10-21 DIAGNOSIS — R450 Nervousness: Secondary | ICD-10-CM | POA: Diagnosis not present

## 2017-10-21 DIAGNOSIS — Z818 Family history of other mental and behavioral disorders: Secondary | ICD-10-CM

## 2017-10-21 DIAGNOSIS — Z9114 Patient's other noncompliance with medication regimen: Secondary | ICD-10-CM | POA: Diagnosis not present

## 2017-10-21 DIAGNOSIS — R451 Restlessness and agitation: Secondary | ICD-10-CM | POA: Diagnosis not present

## 2017-10-21 LAB — RAPID URINE DRUG SCREEN, HOSP PERFORMED
AMPHETAMINES: NOT DETECTED
BENZODIAZEPINES: NOT DETECTED
Barbiturates: NOT DETECTED
Cocaine: NOT DETECTED
OPIATES: NOT DETECTED
TETRAHYDROCANNABINOL: POSITIVE — AB

## 2017-10-21 MED ORDER — ACETAMINOPHEN 325 MG PO TABS: 650.0000 mg | ORAL_TABLET | Freq: Once | ORAL | Status: DC

## 2017-10-21 MED ORDER — TRAZODONE HCL 50 MG PO TABS
50.0000 mg | ORAL_TABLET | Freq: Every evening | ORAL | Status: DC | PRN
  Administered 2017-10-21 – 2017-10-22 (×2): 50 mg via ORAL
  Filled 2017-10-21 (×2): qty 1

## 2017-10-21 MED ORDER — ARIPIPRAZOLE 5 MG PO TABS
5.0000 mg | ORAL_TABLET | Freq: Every day | ORAL | Status: DC
  Administered 2017-10-22 – 2017-10-28 (×7): 5 mg via ORAL
  Filled 2017-10-21 (×10): qty 1

## 2017-10-21 MED ORDER — ARIPIPRAZOLE 5 MG PO TABS
5.0000 mg | ORAL_TABLET | Freq: Every day | ORAL | Status: DC
  Administered 2017-10-21: 5 mg via ORAL
  Filled 2017-10-21: qty 1

## 2017-10-21 MED ORDER — ARIPIPRAZOLE 10 MG PO TABS: 10.0000 mg | ORAL_TABLET | Freq: Every day | ORAL | Status: DC

## 2017-10-21 MED ORDER — ARIPIPRAZOLE 5 MG PO TABS: 5.0000 mg | ORAL_TABLET | Freq: Two times a day (BID) | ORAL | Status: DC

## 2017-10-21 MED ORDER — ALUM & MAG HYDROXIDE-SIMETH 200-200-20 MG/5ML PO SUSP: 30.0000 mL | ORAL | Status: DC | PRN

## 2017-10-21 MED ORDER — ACETAMINOPHEN 325 MG PO TABS
650.0000 mg | ORAL_TABLET | Freq: Four times a day (QID) | ORAL | Status: DC | PRN
  Administered 2017-10-21: 650 mg via ORAL
  Filled 2017-10-21: qty 2

## 2017-10-21 MED ORDER — HYDROXYZINE HCL 25 MG PO TABS
25.0000 mg | ORAL_TABLET | Freq: Three times a day (TID) | ORAL | Status: DC | PRN
  Administered 2017-10-21 – 2017-10-27 (×2): 25 mg via ORAL
  Filled 2017-10-21 (×2): qty 1

## 2017-10-21 MED ORDER — ACETAMINOPHEN 325 MG PO TABS
650.0000 mg | ORAL_TABLET | Freq: Four times a day (QID) | ORAL | Status: DC | PRN
  Administered 2017-10-23 – 2017-10-27 (×3): 650 mg via ORAL
  Filled 2017-10-21 (×3): qty 2

## 2017-10-21 MED ORDER — INFLUENZA VAC SPLIT QUAD 0.5 ML IM SUSY
0.5000 mL | PREFILLED_SYRINGE | INTRAMUSCULAR | Status: AC
  Administered 2017-10-22: 0.5 mL via INTRAMUSCULAR
  Filled 2017-10-21: qty 0.5

## 2017-10-21 MED ORDER — ARIPIPRAZOLE 10 MG PO TABS
10.0000 mg | ORAL_TABLET | Freq: Every day | ORAL | Status: DC
  Administered 2017-10-21 – 2017-10-27 (×7): 10 mg via ORAL
  Filled 2017-10-21 (×10): qty 1

## 2017-10-21 MED ORDER — MAGNESIUM HYDROXIDE 400 MG/5ML PO SUSP: 30.0000 mL | Freq: Every day | ORAL | Status: DC | PRN

## 2017-10-21 NOTE — ED Notes (Signed)
Pt complaint with morning medication regimen, pt guarded on approach, forwards little with this nurse. When pt communicates with this nurse, he holds his hand in front of his mouth, paranoia noted. Encouragement and support provided. Special checks q 15 mins in place for safety, Video monitoring in place. Will continue to monitor.

## 2017-10-21 NOTE — Progress Notes (Signed)
Patient ID: Zachary LeavensJohn C [name], male   DOB: 01/19/1996, 22 y.o.   MRN: [id number]  [facility] Pt's last Abilify Injection was 01/2017.     Zachary AbbeLaurie [name] [name], FNP-BC 10/21/2017           1416
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
## 2017-10-21 NOTE — ED Notes (Signed)
Pt transported to [facility] by [facility] for continuation of specialized care. Pt left in no acute distress. Belongings signed for and given to [employer] officer. Pt left in no acute distress. 

## 2017-10-21 NOTE — ED Notes (Signed)
Bed: [id number] Expected date:  Expected time:  Means of arrival:  Comments: [place]
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
## 2017-10-21 NOTE — ED Notes (Signed)
[employer] called for transport 

## 2017-10-21 NOTE — [facility] Group Notes (Signed)
Pt did not attend group. 

## 2017-10-21 NOTE — Progress Notes (Signed)
Pt came on the unit and used the phone and got upset at his mother because he said she was laughing at him and would not tell him about his Tax papers. 1:1 time spent with pt to de-escalate and pt given his night medications.

## 2017-10-21 NOTE — BH Assessment (Signed)
[facility] Assessment Progress Note  Per Juanetta BeetsJacqueline Norman, DO, this pt requires psychiatric hospitalization.  Berneice Heinrichina Tate, RN, [facility] R[facility].  Pt presents under IVC initiated by pt's mother, and upheld by Dr Sharma CovertNorman, and [facility]C [facility].  Pt's nurse, Morrie Sheldonshley, has been notified, and agrees to call report to [phone].  Pt is to be transported via Patent examinerlaw enforcement.   Doylene Canninghomas Chuck Caban, [place] Behavioral Health Coordinator [phone]
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
## 2017-10-21 NOTE — ED Notes (Signed)
Patient admitted on unit. Complained of tooth ache of 5/10. Seen covering his mouth with his hand. Guarded and anxious. Limited in conversation. Responds to every question by nodding "NO". Denies SI/HI, AH/VH at this time. Will continue to monitor patient.

## 2017-10-21 NOTE — ED Notes (Signed)
Pt talking on hallway phone.  

## 2017-10-22 DIAGNOSIS — G4700 Insomnia, unspecified: Secondary | ICD-10-CM

## 2017-10-22 DIAGNOSIS — F250 Schizoaffective disorder, bipolar type: Principal | ICD-10-CM

## 2017-10-22 DIAGNOSIS — F1220 Cannabis dependence, uncomplicated: Secondary | ICD-10-CM

## 2017-10-22 DIAGNOSIS — Z818 Family history of other mental and behavioral disorders: Secondary | ICD-10-CM

## 2017-10-22 DIAGNOSIS — F17210 Nicotine dependence, cigarettes, uncomplicated: Secondary | ICD-10-CM

## 2017-10-22 DIAGNOSIS — F419 Anxiety disorder, unspecified: Secondary | ICD-10-CM

## 2017-10-22 MED ORDER — LORAZEPAM 1 MG PO TABS
2.0000 mg | ORAL_TABLET | Freq: Four times a day (QID) | ORAL | Status: DC | PRN
  Administered 2017-10-22: 2 mg via ORAL
  Filled 2017-10-22: qty 2

## 2017-10-22 MED ORDER — TRAZODONE HCL 100 MG PO TABS
100.0000 mg | ORAL_TABLET | Freq: Every evening | ORAL | Status: DC | PRN
  Administered 2017-10-23 – 2017-10-27 (×4): 100 mg via ORAL
  Filled 2017-10-22 (×3): qty 1

## 2017-10-22 MED ORDER — DIPHENHYDRAMINE HCL 50 MG/ML IJ SOLN
25.0000 mg | Freq: Four times a day (QID) | INTRAMUSCULAR | Status: DC | PRN
Start: 2017-10-22 — End: 2017-10-28

## 2017-10-22 MED ORDER — DIPHENHYDRAMINE HCL 25 MG PO CAPS: 25.0000 mg | ORAL_CAPSULE | Freq: Four times a day (QID) | ORAL | Status: DC | PRN

## 2017-10-22 MED ORDER — LORAZEPAM 2 MG/ML IJ SOLN
2.0000 mg | Freq: Four times a day (QID) | INTRAMUSCULAR | Status: DC | PRN
Start: 2017-10-22 — End: 2017-10-28

## 2017-10-22 MED ORDER — HALOPERIDOL 5 MG PO TABS: 5.0000 mg | ORAL_TABLET | Freq: Four times a day (QID) | ORAL | Status: DC | PRN

## 2017-10-22 MED ORDER — HALOPERIDOL LACTATE 5 MG/ML IJ SOLN: 5.0000 mg | Freq: Four times a day (QID) | INTRAMUSCULAR | Status: DC | PRN

## 2017-10-22 NOTE — [facility] Group Notes (Signed)
LCSW Group Therapy Note   10/22/2017 1:15pm   Type of Therapy and Topic:  Group Therapy:  Overcoming Obstacles   Participation Level:  Minimal   Description of Group:    In this group patients will be encouraged to explore what they see as obstacles to their own wellness and recovery. They will be guided to discuss their thoughts, feelings, and behaviors related to these obstacles. The group will process together ways to cope with barriers, with attention given to specific choices patients can make. Each patient will be challenged to identify changes they are motivated to make in order to overcome their obstacles. This group will be process-oriented, with patients participating in exploration of their own experiences as well as giving and receiving support and challenge from other group members.   Therapeutic Goals: 1. Patient will identify personal and current obstacles as they relate to admission. 2. Patient will identify barriers that currently interfere with their wellness or overcoming obstacles.  3. Patient will identify feelings, thought process and behaviors related to these barriers. 4. Patient will identify two changes they are willing to make to overcome these obstacles:      Summary of Patient Progress   Was gone for much of group due to being called out to see the Dr.  Upon return, identified his goal as wearing jewelry, and his obstacle as not having any savings.  Limited insight   Therapeutic Modalities:   Cognitive Behavioral Therapy Solution Focused Therapy Motivational Interviewing Relapse Prevention Therapy  [name], LCSW 10/22/2017 3:25 PM

## 2017-10-22 NOTE — Progress Notes (Signed)
Recreation Therapy Notes  Date: 10/22/17 Time: 1000 Location: 500 Hall Dayroom  Group Topic: Anger Management  Goal Area(s) Addresses:  Patient will identify triggers for anger.  Patient will identify physical reaction to anger.   Patient will identify benefit of using coping skills when angry.  Intervention: Worksheet  Activity: Intro to Anger Management.  LRT gave pt a worksheet on anger.  Patients were to identify at least 3 situations that lead to feelings of anger, ways they act differently when angry and problems caused by anger.  Education: Anger Management, Discharge Planning   Education Outcome: Acknowledges education/In group clarification offered/Needs additional education.   Clinical Observations/Feedback: Pt did not attend group.     My [place], LRT/CTRS          Rishith Siddoway A 10/22/2017 12:39 PM 
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
## 2017-10-22 NOTE — Tx Team (Signed)
Interdisciplinary Treatment and Diagnostic Plan Update  10/22/2017 Time of Session: 10:33 AM  [name] MRN: [id number]  Principal Diagnosis: <principal problem not specified>  Secondary Diagnoses: Active Problems:   Schizoaffective disorder, bipolar type (HCC)   Current Medications:  Current Facility-Administered Medications  Medication Dose Route Frequency Provider Last Rate Last Dose  . acetaminophen (TYLENOL) tablet 650 mg  650 mg Oral Q6H PRN Ethelene Hal, NP      . alum & mag hydroxide-simeth (MAALOX/MYLANTA) 200-200-20 MG/5ML suspension 30 mL  30 mL Oral Q4H PRN Ethelene Hal, NP      . ARIPiprazole (ABILIFY) tablet 5 mg  5 mg Oral QAC breakfast Ethelene Hal, NP   5 mg at 10/22/17 5631   And  . ARIPiprazole (ABILIFY) tablet 10 mg  10 mg Oral QHS Ethelene Hal, NP   10 mg at 10/21/17 2126  . diphenhydrAMINE (BENADRYL) capsule 25 mg  25 mg Oral Q6H PRN Money, Lowry Ram, FNP       Or  . diphenhydrAMINE (BENADRYL) injection 25 mg  25 mg Intramuscular Q6H PRN Money, Darnelle Maffucci B, FNP      . haloperidol (HALDOL) tablet 5 mg  5 mg Oral Q6H PRN Money, Darnelle Maffucci B, FNP       Or  . haloperidol lactate (HALDOL) injection 5 mg  5 mg Intramuscular Q6H PRN Money, Lowry Ram, FNP      . hydrOXYzine (ATARAX/VISTARIL) tablet 25 mg  25 mg Oral TID PRN Ethelene Hal, NP   25 mg at 10/21/17 2126  . Influenza vac split quadrivalent PF (FLUARIX) injection 0.5 mL  0.5 mL Intramuscular Tomorrow-1000 [place], Christopher T, MD      . LORazepam (ATIVAN) tablet 2 mg  2 mg Oral Q6H PRN Money, Lowry Ram, FNP       Or  . LORazepam (ATIVAN) injection 2 mg  2 mg Intramuscular Q6H PRN Money, Darnelle Maffucci B, FNP      . magnesium hydroxide (MILK OF MAGNESIA) suspension 30 mL  30 mL Oral Daily PRN Ethelene Hal, NP      . traZODone (DESYREL) tablet 50 mg  50 mg Oral QHS PRN Ethelene Hal, NP   50 mg at 10/21/17 2126    PTA Medications: Medications Prior to  Admission  Medication Sig Dispense Refill Last Dose  . ARIPiprazole (ABILIFY) 5 MG tablet Take 1 tablet (5 mg) in the morning & 2 tablets (10 mg) at bedtime: For mood control 45 tablet 0 Past Month at Unknown time  . ARIPiprazole ER 400 MG SRER Inject 400 mg into the muscle every 30 (thirty) days. (Due on 07-18-17): For mood control 1 each 0 october at Unknown time  . cloNIDine HCl (KAPVAY) 0.1 MG TB12 ER tablet Take 1 tablet (0.1 mg total) by mouth at bedtime. For high blood pressure (Patient not taking: Reported on 10/20/2017) 30 tablet 0 Not Taking at Unknown time  . lamoTRIgine (LAMICTAL) 25 MG tablet Take 3 tablets (75 mg total) by mouth daily. For mood stabilization (Patient not taking: Reported on 10/20/2017) 90 tablet 0 Not Taking at Unknown time  . traZODone (DESYREL) 150 MG tablet Take 1 tablet (150 mg total) by mouth at bedtime. For sleep (Patient not taking: Reported on 10/20/2017) 30 tablet 0 Not Taking at Unknown time    Patient Stressors: Marital or family conflict Medication change or noncompliance  Patient Strengths: General fund of knowledge Physical Health  Treatment Modalities: Medication Management, Group therapy, Case management,  1 to 1 session with clinician, Psychoeducation, Recreational therapy.   Physician Treatment Plan for Primary Diagnosis: <principal problem not specified> Long Term Goal(s): Improvement in symptoms so as ready for discharge  Short Term Goals:    Medication Management: Evaluate patient's response, side effects, and tolerance of medication regimen.  Therapeutic Interventions: 1 to 1 sessions, Unit Group sessions and Medication administration.  Evaluation of Outcomes: Progressing  Physician Treatment Plan for Secondary Diagnosis: Active Problems:   Schizoaffective disorder, bipolar type ([place])   Long Term Goal(s): Improvement in symptoms so as ready for discharge  Short Term Goals:    Medication Management: Evaluate patient's response, side  effects, and tolerance of medication regimen.  Therapeutic Interventions: 1 to 1 sessions, Unit Group sessions and Medication administration.  Evaluation of Outcomes: Progressing   RN Treatment Plan for Primary Diagnosis: <principal problem not specified> Long Term Goal(s): Knowledge of disease and therapeutic regimen to maintain health will improve  Short Term Goals: Ability to identify and develop effective coping behaviors will improve and Compliance with prescribed medications will improve  Medication Management: RN will administer medications as ordered by provider, will assess and evaluate patient's response and provide education to patient for prescribed medication. RN will report any adverse and/or side effects to prescribing provider.  Therapeutic Interventions: 1 on 1 counseling sessions, Psychoeducation, Medication administration, Evaluate responses to treatment, Monitor vital signs and CBGs as ordered, Perform/monitor CIWA, COWS, AIMS and Fall Risk screenings as ordered, Perform wound care treatments as ordered.  Evaluation of Outcomes: Progressing   LCSW Treatment Plan for Primary Diagnosis: <principal problem not specified> Long Term Goal(s): Safe transition to appropriate next level of care at discharge, Engage patient in therapeutic group addressing interpersonal concerns.  Short Term Goals: Engage patient in aftercare planning with referrals and resources  Therapeutic Interventions: Assess for all discharge needs, 1 to 1 time with Social worker, Explore available resources and support systems, Assess for adequacy in community support network, Educate family and significant other(s) on suicide prevention, Complete Psychosocial Assessment, Interpersonal group therapy.  Evaluation of Outcomes: Met  Return home, follow up [facility]   Progress in Treatment: Attending groups: Yes Participating in groups: Yes Taking medication as prescribed: Yes Toleration medication: Yes, no  side effects reported at this time Family/Significant other contact made: No Patient understands diagnosis: No Limited insight Discussing patient identified problems/goals with staff: Yes Medical problems stabilized or resolved: Yes Denies suicidal/homicidal ideation: Yes Issues/concerns per patient self-inventory: None Other: N/A  New problem(s) identified: None identified at this time.   New Short Term/Long Term Goal(s): "I just want to get out as soon as possible"  Discharge Plan or Barriers:   Reason for Continuation of Hospitalization: Disorganization Paranoia Mood Lability Medication stabilization   Estimated Length of Stay: 3-5 days  Attendees: Patient: 10/22/2017  10:33 AM  Physician: Maris Berger, MD 10/22/2017  10:33 AM  Nursing: Sena Hitch, RN 10/22/2017  10:33 AM  RN Care Manager: Lars Pinks, RN 10/22/2017  10:33 AM  Social Worker: Ripley Fraise 10/22/2017  10:33 AM  Recreational Therapist: Winfield Cunas 10/22/2017  10:33 AM  Other: Norberto Sorenson 10/22/2017  10:33 AM  Other:  10/22/2017  10:33 AM    Scribe for Treatment Team:  Roque Lias LCSW 10/22/2017 10:33 AM
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
## 2017-10-22 NOTE — Progress Notes (Signed)
Patient denies SI, HI and AVH.  Patient was noted to have a conversation on the phone earlier today in which he accused his mother of having him committed to the hospital for a long period of time due to the bathrooms in the patient rooms being renovated.  Patient was able to calm and has been in his room for the majority of the shift. The patient went to meals and recreation, but has not attended group.  Patient has been compliant with all medication.    Assess patient for safety, offer medications as prescribed, engage patient in 1:1 staff talks.   Patient able to contract for safety, continue to monitor as planned.

## 2017-10-23 MED ORDER — ARIPIPRAZOLE ER 400 MG IM SRER
400.0000 mg | INTRAMUSCULAR | Status: DC
  Administered 2017-10-23: 400 mg via INTRAMUSCULAR

## 2017-10-23 NOTE — BHH Group Notes (Signed)
Adult Psychoeducational Group Note  Date:  10/23/2017 Time:  9:05 PM  Group Topic/Focus:  Wrap-Up Group:   The focus of this group is to help patients review their daily goal of treatment and discuss progress on daily workbooks.  Participation Level:  Minimal  Participation Quality:  Inattentive  Affect:  Flat  Cognitive:  Alert and Appropriate  Insight: Limited  Engagement in Group:  Lacking and Limited  Modes of Intervention:  Discussion  Additional Comments:  Pt attended and participated in wrap up group. Pt rated their day a 7 due to them having a pretty okay day. Pt did not have a goal and a positive noted by the pt was that they went to group.   Zachary NettersOctavia A Raja [name] 10/23/2017, 9:05 PM
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
## 2017-10-23 NOTE — Progress Notes (Signed)
Patient denies SI, HI and AVH.  Patient was noted to have a conversation on the phone earlier today in which he accused his mother of having him committed to the hospital for a long period of time due to the bathrooms in the patient rooms being renovated.  Patient was able to calm and has been in his room for the majority of the shift. The patient went to meals and recreation, and attended groups this shift  Patient received IM invega sustenna medications this shift.  Patient has been compliant with all medication.    Assess patient for safety, offer medications as prescribed, engage patient in 1:1 staff talks.   Patient able to contract for safety, continue to monitor as planned.

## 2017-10-23 NOTE — Progress Notes (Signed)
D: Pt denies SI/HI/AVH. Pt is pleasant and cooperative. Pt guarded, paranoid and bizarre at times. Pt keeps to himself, pt appears to be responding to internal stimuli when he goes to his room by himself, pt is seen talking to people not observed by staff at times.    A: Pt was offered support and encouragement. Pt was given scheduled medications. Pt was encourage to attend groups. Q 15 minute checks were done for safety.   R:Pt attends groups and interacts well with peers and staff. Pt is taking medication. Pt receptive to treatment and safety maintained on unit.

## 2017-10-23 NOTE — Plan of Care (Signed)
  Coping: Ability to demonstrate self-control will improve 10/23/2017 0123 - Progressing by Delos HaringPhillips, Ivannia Willhelm A, RN Note Pt has been appropriate on the unit this evening

## 2017-10-23 NOTE — [facility] Group Notes (Signed)
[facility] Group Therapy  10/23/2017 , 12:55 PM    Type of Therapy:  [employer] Presentation  Participation Level:  Active  Participation Quality:  Attentive  Affect:  Blunted  Cognitive:  Oriented  Insight:  Limited  Engagement in Therapy:  Engaged  Modes of Intervention:  Discussion, Education and Socialization  Summary of Progress/Problems:  Tammi  from [employer] came to present her recovery story, encourage group  members to share something about their story, and present information about the MHA.  Stayed the entire time, engaged throughout.  Declined to share.  Daryel Geraldorth, Lief Palmatier B 10/23/2017 , 12:55 PM

## 2017-10-23 NOTE — Progress Notes (Signed)
Recreation Therapy Notes  Date: 10/23/17 Time: 1000 Location: 500 Hall Dayroom   Group Topic: Communication, Team Building, Problem Solving  Goal Area(s) Addresses:  Patient will effectively work with peer towards shared goal.  Patient will identify skills used to make activity successful.  Patient will identify how skills used during activity can be used to reach post d/c goals.   Behavioral Response: None  Intervention: STEM Activity  Activity: Landing Pad. In teams patients were given 12 plastic drinking straws and a length of masking tape. Using the materials provided patients were asked to build a landing pad to catch a golf ball dropped from approximately 6 feet in the air.   Education: Pharmacist, communityocial Skills, Discharge Planning   Education Outcome: Acknowledges education/In group clarification offered/Needs additional education.   Clinical Observations/Feedback: Pt did not participate.  Pt sat quietly and observed.    Caroll RancherMarjette Latonya Knight, LRT/CTRS      Caroll RancherLindsay, Sumedha Munnerlyn A 10/23/2017 12:14 PM

## 2017-10-24 NOTE — [facility] Group Notes (Signed)
[facility] LCSW Group Therapy  10/24/2017  1:05 PM  Type of Therapy:  Group therapy  Participation Level:  Active  Participation Quality:  Attentive  Affect:  Flat  Cognitive:  Oriented  Insight:  Limited  Engagement in Therapy:  Limited  Modes of Intervention:  Discussion, Socialization  Summary of Progress/Problems:  Chaplain was here to lead a group on themes of hope and courage. Stayed the entire time, minimal interaction.  Difficult to understand when he does talk as he always has his hand in front of his mouth.  [name], [name] 10/24/2017 1:36 PM
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
## 2017-10-24 NOTE — Progress Notes (Signed)
Patient ID: Zachary LeavensJohn C Trautman, male   DOB: [birth year], 22 y.o.   MRN: [id number]  D: Pt observed pacing on the hallway with his hand over his mouth. Pt also observed talking to self-Pt however, denied hallucinations; "I do all the time; I was rapping to myself." Pt also denied SI, HI, depression or pain. Pt made minimal interaction. A: Medications offered as prescribed. Pt offered the opportunity to ask questions and state concerns. Support, encouragement, and safe environment provided. Will continue to monitor for any changes. 15-minute safety checks continue. R: Pt was med compliant. All patient's questions and concerns addressed. Pt attended wrap-up group. Safety checks continue.

## 2017-10-24 NOTE — [facility] Suicide Risk Assessment (Signed)
[facility] INPATIENT:  Family/Significant Other Suicide Prevention Education  Suicide Prevention Education:  Education Completed; No one has been identified by the patient as the family member/significant other with whom the patient will be residing, and identified as the person(s) who will aid the patient in the event of a mental health crisis (suicidal ideations/suicide attempt).  With written consent from the patient, the family member/significant other has been provided the following suicide prevention education, prior to the and/or following the discharge of the patient.  The suicide prevention education provided includes the following:  Suicide risk factors  Suicide prevention and interventions  National Suicide Hotline telephone number  [facility] assessment telephone number  [facility] Assistance 911  [facility] and/or [facility]idential [facility] telephone number  Request made of family/significant other to:  Remove weapons (e.g., guns, rifles, knives), all items previously/currently identified as safety concern.    Remove drugs/medications (over-the-counter, prescriptions, illicit drugs), all items previously/currently identified as a safety concern.  The family member/significant other verbalizes understanding of the suicide prevention education information provided.  The family member/significant other agrees to remove the items of safety concern listed above. The patient did not endorse SI at the time of admission, nor did the patient c/o SI during the stay here.  SPE not required.  However, I did talk to mother, Ms Zachary DukeLittle, [phone], [phone]  who told me "I have never been threatened by my son" [although the IVC paperwork indicated he had.]  Furthermore, she is fine with him coming home as long as he is willing to work with an ACT team.  Zachary Rogueodney B Armondo Cech 10/24/2017, 10:39 AM

## 2017-10-24 NOTE — Progress Notes (Signed)
Recreation Therapy Notes  Date: 10/24/17 Time: 1000 Location: 500 Hall Dayroom  Group Topic: Wellness  Goal Area(s) Addresses:  Patient will define components of whole wellness. Patient will verbalize benefit of whole wellness.  Behavioral Response: Minimal  Intervention: 2 Decks of Cards, music  Activity: Deck of Chance.  Each patient was given two cards from one deck.  LRT would pull a card from the second deck.  If LRT pulled either of the patients numbers, patients would have to do the exercise that corresponds with that number.    Education: Wellness, Building control surveyorDischarge Planning.   Education Outcome: Acknowledges education/In group clarification offered/Needs additional education.   Clinical Observations/Feedback: Pt arrived late but participated.  Pt completed his exercises with his hand over his mouth.    Caroll RancherMarjette Tilly Pernice, LRT/CTRS     Caroll RancherLindsay, Shelby Anderle A 10/24/2017 12:33 PM

## 2017-10-25 DIAGNOSIS — F1290 Cannabis use, unspecified, uncomplicated: Secondary | ICD-10-CM

## 2017-10-25 DIAGNOSIS — R450 Nervousness: Secondary | ICD-10-CM

## 2017-10-25 DIAGNOSIS — R451 Restlessness and agitation: Secondary | ICD-10-CM

## 2017-10-25 NOTE — Progress Notes (Signed)
Patient is isolative with minimal interaction, but cooperative with unit routine. He keeps his hand in front of his mouth at all times. Denies all symptoms. Difficult to engage to assess symptoms.

## 2017-10-25 NOTE — [facility] Group Notes (Signed)
[facility] LCSW Group Therapy Note  Date/Time:    10/25/2017   11:30 AM - 12:00 PM  Type of Therapy and Topic:  Group Therapy:  Thought Distortions  Participation Level:  None   Description of Group:  In this group, patients were asked to describe the last time they got upset and what their thinking about the situation was at that time.  They were then introduced to cognitive distortions and discussed how these can negatively affect their lives.  This included only a few cognitive distortion types, including  All or Nothing thinking, Focusing on the Negative, Blaming, Predicting the Future and Overgeneralization.  Therapeutic Goals: 1. Patient will be able to identify this exercise of examining their thoughts as a healthy coping mechanism. 2. Patient will identify a problem area in their life and the automatic thoughts they have about that situation. 3. Patient will verbalize the feelings and outcomes typically associated with their thoughts. 4. Patient will think about and discuss other more realistic conclusions/thoughts that could be more helpful.  Summary of Patient Progress:  The patient expressed nothing in group at all, was about 15 minutes late arriving, kept his dreadlocks over his face and did not respond to questions.   Therapeutic Modalities Cognitive Behavioral Therapy  Ambrose MantleMareida Grossman-Orr, LCSW 10/25/2017 12:00PM

## 2017-10-25 NOTE — Progress Notes (Signed)
[facility] MD Progress Note  10/25/2017 11:22 AM ARAV BANNISTER  MRN:  [id number]    Subjective:  [name] is awake, alert. Seen resting in bedroom laying on the bench seat with he's blanket wrapped over his head. Patient reports feeling " okay." NP and patient attempted to contact his sisters.  ( no answer at the number that was provided by patient. Patient has agreed to sign the (request and authorization) for [facility]. Reports concerns with the name of "strategic intervention" - states I don't want to go back into the hospital for intervention, I need to talk to my sister about this form." Reports taken medications as prescribed and tolerating medications well. Patient denies medication side effects.    Denies auditory or visual hallucinations. Present with guarded, restricted and thought blocking at times during this assessment. Support, encouragement and reassurance was provided.     Principal Problem: Schizoaffective disorder, bipolar type (HCC) Diagnosis:   Patient Active Problem List   Diagnosis Date Noted  . Schizoaffective disorder, bipolar type (HCC) [F25.0] 02/26/2017  . Cannabis use disorder, severe, dependence (HCC) [F12.20] 10/21/2014   Total Time spent with patient: 30 minutes  Past Psychiatric History: see H&P  Past Medical History:  Past Medical History:  Diagnosis Date  . ADHD (attention deficit hyperactivity disorder)   . Schizophrenia (HCC)    History reviewed. No pertinent surgical history. Family History:  Family History  Problem Relation Age of Onset  . Bipolar disorder Mother   . Diabetes Mother   . Hypertension Mother    Family Psychiatric  History: see H&P Social History:  Social History   Substance and Sexual Activity  Alcohol Use No   Comment: denies alcohol usag     Social History   Substance and Sexual Activity  Drug Use Yes  . Types: Marijuana   Comment: everyday    Social History   Socioeconomic History  . Marital status: Single   Spouse name: None  . Number of children: None  . Years of education: None  . Highest education level: None  Social Needs  . Financial resource strain: None  . Food insecurity - worry: None  . Food insecurity - inability: None  . Transportation needs - medical: None  . Transportation needs - non-medical: None  Occupational History  . None  Tobacco Use  . Smoking status: Current Every Day Smoker    Packs/day: 0.50    Types: Cigarettes  . Smokeless tobacco: Never Used  Substance and Sexual Activity  . Alcohol use: No    Comment: denies alcohol usag  . Drug use: Yes    Types: Marijuana    Comment: everyday  . Sexual activity: Yes    Birth control/protection: Condom  Other Topics Concern  . None  Social History Narrative  . None   Additional Social History:                         Sleep: Fair  Appetite:  Fair  Current Medications: Current Facility-Administered Medications  Medication Dose Route Frequency Provider Last Rate Last Dose  . acetaminophen (TYLENOL) tablet 650 mg  650 mg Oral Q6H PRN Laveda Abbe, NP   650 mg at 10/23/17 0226  . alum & mag hydroxide-simeth (MAALOX/MYLANTA) 200-200-20 MG/5ML suspension 30 mL  30 mL Oral Q4H PRN Laveda Abbe, NP      . ARIPiprazole (ABILIFY) tablet 5 mg  5 mg Oral QAC breakfast Laveda Abbe, NP  5 mg at 10/25/17 [id number]   And  . ARIPiprazole (ABILIFY) tablet 10 mg  10 mg Oral QHS Laveda AbbeParks, Laurie Britton, NP   10 mg at 10/24/17 2046  . ARIPiprazole ER SRER 400 mg  400 mg Intramuscular Q30 days Micheal Likensainville, Christopher T, MD   400 mg at 10/23/17 1701  . diphenhydrAMINE (BENADRYL) capsule 25 mg  25 mg Oral Q6H PRN Money, Gerlene Burdockravis B, FNP       Or  . diphenhydrAMINE (BENADRYL) injection 25 mg  25 mg Intramuscular Q6H PRN Money, Feliz Beamravis B, FNP      . haloperidol (HALDOL) tablet 5 mg  5 mg Oral Q6H PRN Money, Feliz Beamravis B, FNP       Or  . haloperidol lactate (HALDOL) injection 5 mg  5 mg Intramuscular Q6H PRN  Money, Gerlene Burdockravis B, FNP      . hydrOXYzine (ATARAX/VISTARIL) tablet 25 mg  25 mg Oral TID PRN Laveda AbbeParks, Laurie Britton, NP   25 mg at 10/21/17 2126  . LORazepam (ATIVAN) tablet 2 mg  2 mg Oral Q6H PRN Money, Gerlene Burdockravis B, FNP   2 mg at 10/22/17 2129   Or  . LORazepam (ATIVAN) injection 2 mg  2 mg Intramuscular Q6H PRN Money, Feliz Beamravis B, FNP      . magnesium hydroxide (MILK OF MAGNESIA) suspension 30 mL  30 mL Oral Daily PRN Laveda AbbeParks, Laurie Britton, NP      . traZODone (DESYREL) tablet 100 mg  100 mg Oral QHS PRN Donell SievertSimon, Spencer E, PA-C   100 mg at 10/24/17 2046    Lab Results: No results found for this or any previous visit (from the past 48 hour(s)).  Blood Alcohol level:  Lab Results  Component Value Date   ETH <10 10/20/2017   ETH <5 06/24/2017    Metabolic Disorder Labs: Lab Results  Component Value Date   HGBA1C 5.4 06/16/2017   MPG 108.28 06/16/2017   MPG 103 02/27/2017   Lab Results  Component Value Date   PROLACTIN 2.3 (L) 06/16/2017   Lab Results  Component Value Date   CHOL 156 06/16/2017   TRIG 75 06/16/2017   HDL 47 06/16/2017   CHOLHDL 3.3 06/16/2017   VLDL 15 06/16/2017   LDLCALC 94 06/16/2017   LDLCALC 109 (H) 02/27/2017    Physical Findings: AIMS: Facial and Oral Movements Muscles of Facial Expression: None, normal Lips and Perioral Area: None, normal Jaw: None, normal Tongue: None, normal,Extremity Movements Upper (arms, wrists, hands, fingers): None, normal Lower (legs, knees, ankles, toes): None, normal, Trunk Movements Neck, shoulders, hips: None, normal, Overall Severity Severity of abnormal movements (highest score from questions above): None, normal Incapacitation due to abnormal movements: None, normal Patient's awareness of abnormal movements (rate only patient's report): No Awareness, Dental Status Current problems with teeth and/or dentures?: No Does patient usually wear dentures?: No  CIWA:    COWS:     Musculoskeletal: Strength & Muscle Tone:  within normal limits Gait & Station: normal Patient leans: N/A  Psychiatric Specialty Exam: Physical Exam  Nursing note and vitals reviewed. Constitutional: He is oriented to person, place, and time. He appears well-developed.  Neurological: He is alert and oriented to person, place, and time.  Psychiatric: He has a normal mood and affect. His behavior is normal.    Review of Systems  Neurological: Sensory change:    Psychiatric/Behavioral: Negative for depression, hallucinations and suicidal ideas. The patient is nervous/anxious.   All other systems reviewed and are negative.   Blood  pressure 128/79, pulse 96, temperature 97.9 F (36.6 C), temperature source Oral, resp. rate 20, height 6\' 3"  (1.905 m), weight 102.7 kg (226 lb 8 oz), SpO2 99 %.Body mass index is 28.31 kg/m.  General Appearance: Bizarre, Casual and Fairly Groomed  Eye Contact:  Good  Speech:  Clear and Coherent and Normal Rate  Volume:  Normal  Mood:  Anxious and Depressed  Affect:  Congruent, Constricted and Flat  Thought Process:  Coherent, Goal Directed and Descriptions of Associations: Loose  Orientation:  Full (Time, Place, and Person)  Thought Content:  Ideas of Reference:   Paranoia Delusions, Obsessions and Paranoid Ideation  Suicidal Thoughts:  No  Homicidal Thoughts:  No  Memory:  Immediate;   Fair Recent;   Fair Remote;   Fair  Judgement:  Impaired  Insight:  Lacking  Psychomotor Activity:  Normal  Concentration:  Concentration: Fair  Recall:  [employer] of Knowledge:  Fair  Language:  Fair  Akathisia:  No  Handed:    AIMS (if indicated):     Assets:  Communication Skills Leisure Time Physical Health Resilience Social Support  ADL's:  Intact  Cognition:  WNL  Sleep:  Number of Hours: 2.25     Treatment Plan Summary: Daily contact with patient to assess and evaluate symptoms and progress in treatment and Medication management.  -Continue with current treatment plan on 10/25/2017  except where ntoed   -Schizoaffective disorder, bipolar type - Continue Abilify 5mg  po qAM + 10mg  po qhs             - Continue Abilify SRER 400mg  IM q30 days (last given 10/23/17)  -Agitation - Continue PRN's for haldol/ativan  - Anxiety - Continue atarax 25mg  po q8h prn anxiety  -Insomnia - Continue trazodone 50mg  po qhs prn insomnia  -Encourage participation in groups and the therapeutic milieu -Discharge planning will be ongoing    Oneta Rack, NP 10/25/2017, 11:22 AM

## 2017-10-25 NOTE — Progress Notes (Signed)
Patient ID: Zachary LeavensJohn C [name], male   DOB: 1996-10-07, 22 y.o.   MRN: [id number] D: Pt observed pacing in his room with blanket over his mouth. Pt denied anxiety or hallucinations. Pt also denied SI, HI, depression or pain. Pt made minimal interaction. Pt appear to be hallucinating (talking to self) A: Medications offered as prescribed. Pt offered the opportunity to ask questions and state concerns. Support, encouragement, and safe environment provided. Will continue to monitor for any changes. 15-minute safety checks continue. R: Pt was med compliant. All patient's questions and concerns addressed. Pt attended wrap-up group. Safety checks continue.

## 2017-10-26 MED ORDER — NICOTINE POLACRILEX 2 MG MT GUM
2.0000 mg | CHEWING_GUM | OROMUCOSAL | Status: DC | PRN
  Administered 2017-10-26: 2 mg via ORAL
  Filled 2017-10-26: qty 1

## 2017-10-26 NOTE — Progress Notes (Signed)
[facility] MD Progress Note  10/26/2017 11:33 AM JAMIS KRYDER  MRN:  [id number]    Subjective: [name] reports " I am feeling fine,  I am just ready to leave."    [name] seen pacing the unit with the blanket wrapped around is head and face. Patient is guarded and mild thought blocking and delayed responses to question. Patient keep his hand over is mouth while talking.  However patient reports his mood is improving since is admission. Continues to present with restricted, bazaar and paranoid behavior. Seconded attempt to contact sister during assessment. Patient reports sister had questions and concerns. Denies auditory or visual hallucinations.Support, encouragement and reassurance was provided.   Principal Problem: Schizoaffective disorder, bipolar type (HCC) Diagnosis:   Patient Active Problem List   Diagnosis Date Noted  . Schizoaffective disorder, bipolar type (HCC) [F25.0] 02/26/2017  . Cannabis use disorder, severe, dependence (HCC) [F12.20] 10/21/2014   Total Time spent with patient: 30 minutes  Past Psychiatric History: see H&P  Past Medical History:  Past Medical History:  Diagnosis Date  . ADHD (attention deficit hyperactivity disorder)   . Schizophrenia (HCC)    History reviewed. No pertinent surgical history. Family History:  Family History  Problem Relation Age of Onset  . Bipolar disorder Mother   . Diabetes Mother   . Hypertension Mother    Family Psychiatric  History: see H&P Social History:  Social History   Substance and Sexual Activity  Alcohol Use No   Comment: denies alcohol usag     Social History   Substance and Sexual Activity  Drug Use Yes  . Types: Marijuana   Comment: everyday    Social History   Socioeconomic History  . Marital status: Single    Spouse name: None  . Number of children: None  . Years of education: None  . Highest education level: None  Social Needs  . Financial resource strain: None  . Food insecurity - worry: None   . Food insecurity - inability: None  . Transportation needs - medical: None  . Transportation needs - non-medical: None  Occupational History  . None  Tobacco Use  . Smoking status: Current Every Day Smoker    Packs/day: 0.50    Types: Cigarettes  . Smokeless tobacco: Never Used  Substance and Sexual Activity  . Alcohol use: No    Comment: denies alcohol usag  . Drug use: Yes    Types: Marijuana    Comment: everyday  . Sexual activity: Yes    Birth control/protection: Condom  Other Topics Concern  . None  Social History Narrative  . None   Additional Social History:                         Sleep: Fair  Appetite:  Fair  Current Medications: Current Facility-Administered Medications  Medication Dose Route Frequency Provider Last Rate Last Dose  . acetaminophen (TYLENOL) tablet 650 mg  650 mg Oral Q6H PRN Laveda Abbe, NP   650 mg at 10/26/17 0007  . alum & mag hydroxide-simeth (MAALOX/MYLANTA) 200-200-20 MG/5ML suspension 30 mL  30 mL Oral Q4H PRN Laveda Abbe, NP      . ARIPiprazole (ABILIFY) tablet 5 mg  5 mg Oral QAC breakfast Laveda Abbe, NP   5 mg at 10/26/17 4098   And  . ARIPiprazole (ABILIFY) tablet 10 mg  10 mg Oral QHS Laveda Abbe, NP   10 mg at 10/25/17  2113  . ARIPiprazole ER SRER 400 mg  400 mg Intramuscular Q30 days Micheal Likensainville, Christopher T, MD   400 mg at 10/23/17 1701  . diphenhydrAMINE (BENADRYL) capsule 25 mg  25 mg Oral Q6H PRN Money, Gerlene Burdockravis B, FNP       Or  . diphenhydrAMINE (BENADRYL) injection 25 mg  25 mg Intramuscular Q6H PRN Money, Feliz Beamravis B, FNP      . haloperidol (HALDOL) tablet 5 mg  5 mg Oral Q6H PRN Money, Feliz Beamravis B, FNP       Or  . haloperidol lactate (HALDOL) injection 5 mg  5 mg Intramuscular Q6H PRN Money, Gerlene Burdockravis B, FNP      . hydrOXYzine (ATARAX/VISTARIL) tablet 25 mg  25 mg Oral TID PRN Laveda AbbeParks, Laurie Britton, NP   25 mg at 10/21/17 2126  . LORazepam (ATIVAN) tablet 2 mg  2 mg Oral Q6H PRN  Money, Gerlene Burdockravis B, FNP   2 mg at 10/22/17 2129   Or  . LORazepam (ATIVAN) injection 2 mg  2 mg Intramuscular Q6H PRN Money, Feliz Beamravis B, FNP      . magnesium hydroxide (MILK OF MAGNESIA) suspension 30 mL  30 mL Oral Daily PRN Laveda AbbeParks, Laurie Britton, NP      . traZODone (DESYREL) tablet 100 mg  100 mg Oral QHS PRN Kerry HoughSimon, Spencer E, PA-C   100 mg at 10/25/17 2113    Lab Results: No results found for this or any previous visit (from the past 48 hour(s)).  Blood Alcohol level:  Lab Results  Component Value Date   ETH <10 10/20/2017   ETH <5 06/24/2017    Metabolic Disorder Labs: Lab Results  Component Value Date   HGBA1C 5.4 06/16/2017   MPG 108.28 06/16/2017   MPG 103 02/27/2017   Lab Results  Component Value Date   PROLACTIN 2.3 (L) 06/16/2017   Lab Results  Component Value Date   CHOL 156 06/16/2017   TRIG 75 06/16/2017   HDL 47 06/16/2017   CHOLHDL 3.3 06/16/2017   VLDL 15 06/16/2017   LDLCALC 94 06/16/2017   LDLCALC 109 (H) 02/27/2017    Physical Findings: AIMS: Facial and Oral Movements Muscles of Facial Expression: None, normal Lips and Perioral Area: None, normal Jaw: None, normal Tongue: None, normal,Extremity Movements Upper (arms, wrists, hands, fingers): None, normal Lower (legs, knees, ankles, toes): None, normal, Trunk Movements Neck, shoulders, hips: None, normal, Overall Severity Severity of abnormal movements (highest score from questions above): None, normal Incapacitation due to abnormal movements: None, normal Patient's awareness of abnormal movements (rate only patient's report): No Awareness, Dental Status Current problems with teeth and/or dentures?: No Does patient usually wear dentures?: No  CIWA:    COWS:     Musculoskeletal: Strength & Muscle Tone: within normal limits Gait & Station: normal Patient leans: N/A  Psychiatric Specialty Exam: Physical Exam  Nursing note and vitals reviewed. Constitutional: He is oriented to person, place,  and time. He appears well-developed.  Neurological: He is alert and oriented to person, place, and time.  Psychiatric: He has a normal mood and affect. His behavior is normal.    Review of Systems  Neurological: Sensory change:    Psychiatric/Behavioral: Negative for depression, hallucinations and suicidal ideas. The patient is nervous/anxious.   All other systems reviewed and are negative.   Blood pressure 117/64, pulse (!) 59, temperature 98.1 F (36.7 C), temperature source Oral, resp. rate 20, height 6\' 3"  (1.905 m), weight 102.7 kg (226 lb 8 oz), SpO2 99 %.  Body mass index is 28.31 kg/m.  General Appearance: Bizarre, Casual and Fairly Groomed  Eye Contact:  Fair  Speech:  Clear and Coherent and Normal Rate  Volume:  Normal  Mood:  Anxious and Depressed  Affect:  Congruent, Constricted and Flat  Thought Process:  Coherent, Goal Directed and Descriptions of Associations: Loose  Orientation:  Full (Time, Place, and Person)  Thought Content:  Ideas of Reference:   Paranoia Delusions, Obsessions and Paranoid Ideation  Suicidal Thoughts:  No  Homicidal Thoughts:  No  Memory:  Immediate;   Fair Recent;   Fair Remote;   Fair  Judgement:  Impaired  Insight:  Lacking  Psychomotor Activity:  Normal  Concentration:  Concentration: Fair  Recall:  [employer] of Knowledge:  Fair  Language:  Fair  Akathisia:  No  Handed:    AIMS (if indicated):     Assets:  Communication Skills Leisure Time Physical Health Resilience Social Support  ADL's:  Intact  Cognition:  WNL  Sleep:  Number of Hours: 5.75     Treatment Plan Summary: Daily contact with patient to assess and evaluate symptoms and progress in treatment and Medication management.  -Continue with current treatment plan on 10/26/2017 except where ntoed   -Schizoaffective disorder, bipolar type - Continue Abilify 5mg  po qAM + 10mg  po qhs             - Continue Abilify SRER 400mg  IM q30 days (last given  10/23/17)  -Agitation - Continue PRN's for haldol/ativan  - Anxiety - Continue atarax 25mg  po q8h prn anxiety  -Insomnia - Continue trazodone 50mg  po qhs prn insomnia  -Encourage participation in groups and the therapeutic milieu -Discharge planning will be ongoing    Oneta Rack, NP 10/26/2017, 11:33 AM

## 2017-10-26 NOTE — Progress Notes (Signed)
Patient remains isolative to his room with his blanket wrapped around him at all times and his hand in front of his mouth. He took scheduled medication along with trazadone to aid with sleeping. Safety maintained on unit with 15 min checks.

## 2017-10-26 NOTE — [facility] Group Notes (Signed)
[facility]  Date/Time:  10/26/2017  11:00AM-12:00PM  Type of Therapy and Topic:  Group Therapy:  Music and Mood  Participation Level:  Active   Description of Group: In this process group, members listened to a variety of genres of music and identified that different types of music evoke different responses.  Patients were encouraged to identify music that was soothing for them and music that was energizing for them.  Patients discussed how this knowledge can help with wellness and recovery in various ways including managing depression and anxiety as well as encouraging healthy sleep habits.    Therapeutic Goals: 1. Patients will explore the impact of different varieties of music on mood 2. Patients will verbalize the thoughts they have when listening to different types of music 3. Patients will identify music that is soothing to them as well as music that is energizing to them 4. Patients will discuss how to use this knowledge to assist in maintaining wellness and recovery 5. Patients will explore the use of music as a coping skill  Summary of Patient Progress:  At the beginning of group, patient expressed that he felt "calm" and at the end of group stated he felt "content."  When asked questions, he had a delay in his answers but did respond appropriately.  Therapeutic Modalities: Solution Focused Brief Therapy Activity   Ambrose MantleMareida Grossman-Orr, LCSW

## 2017-10-26 NOTE — Plan of Care (Signed)
Minimally interactive while present in the milieu. Denies previous threatening and violent behavior, no behavioral dyscontrol since admission. Does not verbalize anger or frustration, denies any negative emotion and thinking. Denies SI/HI/AVH, presents with little insight. Compliant with medications and unit routine.

## 2017-10-27 NOTE — Tx Team (Signed)
Interdisciplinary Treatment and Diagnostic Plan Update  10/27/2017 Time of Session: 10:35 AM  [name] MRN: [id number]  Principal Diagnosis: Schizoaffective disorder, bipolar type ([place])  Secondary Diagnoses: Principal Problem:   Schizoaffective disorder, bipolar type ([place]) Active Problems:   Cannabis use disorder, severe, dependence ([place])   Current Medications:  Current Facility-Administered Medications  Medication Dose Route Frequency Provider Last Rate Last Dose  . acetaminophen (TYLENOL) tablet 650 mg  650 mg Oral Q6H PRN Ethelene Hal, NP   650 mg at 10/26/17 0007  . alum & mag hydroxide-simeth (MAALOX/MYLANTA) 200-200-20 MG/5ML suspension 30 mL  30 mL Oral Q4H PRN Ethelene Hal, NP      . ARIPiprazole (ABILIFY) tablet 5 mg  5 mg Oral QAC breakfast Ethelene Hal, NP   5 mg at 10/27/17 0630   And  . ARIPiprazole (ABILIFY) tablet 10 mg  10 mg Oral QHS Ethelene Hal, NP   10 mg at 10/26/17 2115  . ARIPiprazole ER SRER 400 mg  400 mg Intramuscular Q30 days Pennelope Bracken, MD   400 mg at 10/23/17 1701  . diphenhydrAMINE (BENADRYL) capsule 25 mg  25 mg Oral Q6H PRN Money, Lowry Ram, FNP       Or  . diphenhydrAMINE (BENADRYL) injection 25 mg  25 mg Intramuscular Q6H PRN Money, Darnelle Maffucci B, FNP      . haloperidol (HALDOL) tablet 5 mg  5 mg Oral Q6H PRN Money, Darnelle Maffucci B, FNP       Or  . haloperidol lactate (HALDOL) injection 5 mg  5 mg Intramuscular Q6H PRN Money, Lowry Ram, FNP      . hydrOXYzine (ATARAX/VISTARIL) tablet 25 mg  25 mg Oral TID PRN Ethelene Hal, NP   25 mg at 10/21/17 2126  . LORazepam (ATIVAN) tablet 2 mg  2 mg Oral Q6H PRN Money, Lowry Ram, FNP   2 mg at 10/22/17 2129   Or  . LORazepam (ATIVAN) injection 2 mg  2 mg Intramuscular Q6H PRN Money, Darnelle Maffucci B, FNP      . magnesium hydroxide (MILK OF MAGNESIA) suspension 30 mL  30 mL Oral Daily PRN Ethelene Hal, NP      . nicotine polacrilex (NICORETTE) gum 2 mg  2 mg  Oral PRN Derrill Center, NP   2 mg at 10/26/17 1518  . traZODone (DESYREL) tablet 100 mg  100 mg Oral QHS PRN Laverle Hobby, PA-C   100 mg at 10/25/17 2113    PTA Medications: Medications Prior to Admission  Medication Sig Dispense Refill Last Dose  . ARIPiprazole (ABILIFY) 5 MG tablet Take 1 tablet (5 mg) in the morning & 2 tablets (10 mg) at bedtime: For mood control 45 tablet 0 Past Month at Unknown time  . ARIPiprazole ER 400 MG SRER Inject 400 mg into the muscle every 30 (thirty) days. (Due on 07-18-17): For mood control 1 each 0 october at Unknown time  . cloNIDine HCl (KAPVAY) 0.1 MG TB12 ER tablet Take 1 tablet (0.1 mg total) by mouth at bedtime. For high blood pressure (Patient not taking: Reported on 10/20/2017) 30 tablet 0 Not Taking at Unknown time  . lamoTRIgine (LAMICTAL) 25 MG tablet Take 3 tablets (75 mg total) by mouth daily. For mood stabilization (Patient not taking: Reported on 10/20/2017) 90 tablet 0 Not Taking at Unknown time  . traZODone (DESYREL) 150 MG tablet Take 1 tablet (150 mg total) by mouth at bedtime. For sleep (Patient not taking: Reported  on 10/20/2017) 30 tablet 0 Not Taking at Unknown time    Patient Stressors: Marital or family conflict Medication change or noncompliance  Patient Strengths: General fund of knowledge Physical Health  Treatment Modalities: Medication Management, Group therapy, Case management,  1 to 1 session with clinician, Psychoeducation, Recreational therapy.   Physician Treatment Plan for Primary Diagnosis: Schizoaffective disorder, bipolar type ([place]) Long Term Goal(s): Improvement in symptoms so as ready for discharge  Short Term Goals: Compliance with prescribed medications will improve Ability to maintain clinical measurements within normal limits will improve  Medication Management: Evaluate patient's response, side effects, and tolerance of medication regimen.  Therapeutic Interventions: 1 to 1 sessions, Unit Group sessions  and Medication administration.  Evaluation of Outcomes: Adequate for Discharge  Physician Treatment Plan for Secondary Diagnosis: Principal Problem:   Schizoaffective disorder, bipolar type ([place]) Active Problems:   Cannabis use disorder, severe, dependence ([place])   Long Term Goal(s): Improvement in symptoms so as ready for discharge  Short Term Goals: Compliance with prescribed medications will improve Ability to maintain clinical measurements within normal limits will improve  Medication Management: Evaluate patient's response, side effects, and tolerance of medication regimen.  Therapeutic Interventions: 1 to 1 sessions, Unit Group sessions and Medication administration.  Evaluation of Outcomes: Adequate for Discharge   RN Treatment Plan for Primary Diagnosis: Schizoaffective disorder, bipolar type ([place]) Long Term Goal(s): Knowledge of disease and therapeutic regimen to maintain health will improve  Short Term Goals: Ability to identify and develop effective coping behaviors will improve and Compliance with prescribed medications will improve  Medication Management: RN will administer medications as ordered by provider, will assess and evaluate patient's response and provide education to patient for prescribed medication. RN will report any adverse and/or side effects to prescribing provider.  Therapeutic Interventions: 1 on 1 counseling sessions, Psychoeducation, Medication administration, Evaluate responses to treatment, Monitor vital signs and CBGs as ordered, Perform/monitor CIWA, COWS, AIMS and Fall Risk screenings as ordered, Perform wound care treatments as ordered.  Evaluation of Outcomes: Adequate for Discharge   LCSW Treatment Plan for Primary Diagnosis: Schizoaffective disorder, bipolar type ([place]) Long Term Goal(s): Safe transition to appropriate next level of care at discharge, Engage patient in therapeutic group addressing interpersonal concerns.  Short Term Goals:  Engage patient in aftercare planning with referrals and resources  Therapeutic Interventions: Assess for all discharge needs, 1 to 1 time with Social worker, Explore available resources and support systems, Assess for adequacy in community support network, Educate family and significant other(s) on suicide prevention, Complete Psychosocial Assessment, Interpersonal group therapy.  Evaluation of Outcomes: Met  Return home, follow up [facility] 1/14:  Pt was finally willing to sign release today-referred to [facility] ACT team   Progress in Treatment: Attending groups: Yes Participating in groups: Yes Taking medication as prescribed: Yes Toleration medication: Yes, no side effects reported at this time Family/Significant other contact made: No Patient understands diagnosis: No Limited insight Discussing patient identified problems/goals with staff: Yes Medical problems stabilized or resolved: Yes Denies suicidal/homicidal ideation: Yes Issues/concerns per patient self-inventory: None Other: N/A  New problem(s) identified: None identified at this time.   New Short Term/Long Term Goal(s): "I just want to get out as soon as possible"  Discharge Plan or Barriers:   Reason for Continuation of Hospitalization:    Medication stabilization   Estimated Length of Stay: Likely d/c tomorrow, Wednesday at the latest.  CSW to confirm appropriateness of referral with ACT team and get assessment time and date  Attendees: Patient: 10/27/2017  10:35 AM  Physician: Maris Berger, MD 10/27/2017  10:35 AM  Nursing: Sena Hitch, RN 10/27/2017  10:35 AM  RN Care Manager: Lars Pinks, RN 10/27/2017  10:35 AM  Social Worker: Ripley Fraise 10/27/2017  10:35 AM  Recreational Therapist: Winfield Cunas 10/27/2017  10:35 AM  Other: Norberto Sorenson 10/27/2017  10:35 AM  Other:  10/27/2017  10:35 AM    Scribe for Treatment Team:  Roque Lias LCSW 10/27/2017 10:35 AM

## 2017-10-27 NOTE — [facility] Group Notes (Signed)
LCSW Group Therapy Note   10/27/2017 1:15pm   Type of Therapy and Topic:  Group Therapy:  Positive Affirmations   Participation Level:  Did Not Attend  Description of Group: This group addressed positive affirmation toward self and others. Patients went around the room and identified two positive things about themselves and two positive things about a peer in the room. Patients reflected on how it felt to share something positive with others, to identify positive things about themselves, and to hear positive things from others. Patients were encouraged to have a daily reflection of positive characteristics or circumstances.  Therapeutic Goals 1. Patient will verbalize two of their positive qualities 2. Patient will demonstrate empathy for others by stating two positive qualities about a peer in the group 3. Patient will verbalize their feelings when voicing positive self affirmations and when voicing positive affirmations of others 4. Patients will discuss the potential positive impact on their wellness/recovery of focusing on positive traits of self and others. Summary of Patient Progress:    Therapeutic Modalities Cognitive Behavioral Therapy Motivational Interviewing  Zachary RogueRodney B Karston Hyland, [place]CS[place] 10/27/2017 3:31 PM

## 2017-10-27 NOTE — Progress Notes (Signed)
Writer has observed patient up and active a little more tonight instead of isolating in his room. Minimal verbal interaction with peers or staff. Writer asked if he had a toothache or a bad tooth and he reported that he didn't. Support givne and safety maintained on unit with 15 min checks.

## 2017-10-27 NOTE — Progress Notes (Signed)
Patient denies SI, HI and AVH.  Patient has been pacing the halls and has had minimal conversation with staff. Patient has attended groups and engaged in unit activities.  Patient continues to cover his mouth when he speaks.   Assess patient for safety, offer medications as prescribed, engage patient in 1:1 staff talks.   Continue to monitor as planned.   Patient able to contract for safety.
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
## 2017-10-27 NOTE — Progress Notes (Signed)
Recreation Therapy Notes  Date: 10/27/17 Time: 1000 Location: 500 Hall Dayroom  Group Topic: Coping Skills  Goal Area(s) Addresses:  Patients will be able to identify positive coping skills. Patients will be able to identify the benefits of using coping skills post d/c.  Behavioral Response: Minimal  Intervention: Worksheet, dry erase board, dry erase marker  Activity: Mind map.  Patients were given a blank mind map.  LRT and patients filled out the first 8 boxes together with anxiety, depression, paranoia, fear, concentration, finances, hygiene and disappointment.  Individually, patients were to then come up with 3 coping skills for each area identified.  The group would then reconvene and LRT would put the coping skills on the board.  Education: PharmacologistCoping Skills, Building control surveyorDischarge Planning.   Education Outcome: Acknowledges understanding/In group clarification offered/Needs additional education.   Clinical Observations/Feedback: Pt stated counting to 10 and meditation were types of coping skills.  Pt filled in his sheet and mainly listened during group.   Zachary RancherMarjette Kohner [name], LRT/CTRS         Zachary AbedLindsay, [name] A 10/27/2017 1:24 PM

## 2017-10-27 NOTE — Progress Notes (Signed)
Did not attend group 

## 2017-10-28 MED ORDER — ARIPIPRAZOLE 5 MG PO TABS: ORAL_TABLET | ORAL | 0 refills | Status: DC

## 2017-10-28 MED ORDER — ARIPIPRAZOLE ER 400 MG IM SRER: 400.0000 mg | INTRAMUSCULAR | 0 refills | Status: AC

## 2017-10-28 MED ORDER — NICOTINE POLACRILEX 2 MG MT GUM: 2.0000 mg | CHEWING_GUM | OROMUCOSAL | 0 refills | Status: AC | PRN

## 2017-10-28 MED ORDER — HYDROXYZINE HCL 25 MG PO TABS: 25.0000 mg | ORAL_TABLET | Freq: Three times a day (TID) | ORAL | 0 refills | Status: AC | PRN

## 2017-10-28 MED ORDER — TRAZODONE HCL 100 MG PO TABS: 100.0000 mg | ORAL_TABLET | Freq: Every evening | ORAL | 0 refills | Status: AC | PRN

## 2017-10-28 NOTE — Progress Notes (Addendum)
Patient ID: Zachary LeavensJohn C [name], male   DOB: [birth year], 22 y.o.   MRN: [id number] D: Pt observed pacing on the hallway with blanket over his mouth. Pt denied anxiety AVH, SI, HI, depression or pain. Pt made minimal interaction. A: Medications offered as prescribed. Pt offered the opportunity to ask questions and state concerns. Support, encouragement, and safe environment provided. Will continue to monitor for any changes. 15-minute safety checks continue. R: Pt was med compliant. All patient's questions and concerns addressed. Safety checks continue.

## 2017-10-28 NOTE — Progress Notes (Signed)
Patient verbalizes readiness for discharge. Follow up plan explained, AVS, transition record and SRA given along with prescriptions.  All belongings returned. Patient refused to complete Suicide Safety Plan as he states, "I was not suicidal when I came in and I'm not now. They said I had a gun but that's not true." Patient verbalizes understanding. Denies SI/HI/AVH and assures this Clinical research associatewriter he will seek assistance should that change. Patient discharged ambulatory and in stable condition with bus pass.
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
## 2017-10-28 NOTE — Progress Notes (Signed)
  [facility] :  Will you be returning to the same living situation after discharge:  Yes,  home At discharge, do you have transportation home?: Yes,  bus pass Do you have the ability to pay for your medications: Yes,  MCD  Release of information consent forms completed and in the chart;  Patient's signature needed at discharge.  Patient to Follow up at: Follow-up Information    [facility] Follow up on 10/30/2017.   Why:  Thursday at 11AM with Dominic with the ACT team  Call Lauren at [phone] if you need to reschedule this.  Contact information: [address] [place] [address] [phone]           Next level of care provider [facility] Link:no  Safety Planning and Suicide Prevention discussed: Yes,  yes  Have you used any form of tobacco in the last 30 days? (Cigarettes, Smokeless Tobacco, Cigars, and/or Pipes): Yes  Has patient been referred to the Quitline?: Patient refused referral  Patient has been referred for addiction treatment: Yes  Zachary RogueRodney B Yazen Rosko, LCSW 10/28/2017, 8:49 AM

## 2017-10-28 NOTE — Progress Notes (Signed)
Recreation Therapy Notes  Date: 10/28/17 Time: 1000 Location: 500 Hall Dayroom  Group Topic: Leisure Education, Goal Setting  Goal Area(s) Addresses:  Patient will be able to identify at least 3 life goals.  Patient will be able to identify benefit of investing in life goals.  Patient will be able to identify benefit of setting life goals.   Behavioral Response:  Engaged  Intervention: Worksheet  Activity: Garment/textile technologistGoal Planning.  Patients were given a worksheet in which they had to identify goals they wanted to accomplish within the next week, month, year and five years.  Patients were to then identify the obstacles to reaching their goals, what they will need to do to achieve their goals and what they can start doing immediately to work towards their goals.  Education:  Discharge Planning, PharmacologistCoping Skills, Leisure Education   Education Outcome: Acknowledges Education/In Group Clarification Provided/Needs Additional Education  Clinical Observation:  Pt was quiet but engaged when prompted.  Pt stated in one week he needs to go to appointments; get a bar and grill next month; save more taxes in a year and save at least five years of tax returns in five years.  Pt stated he obstacle was spending, needs to network to achieve goals and he can start tomorrow by taking his medications.     Zachary RancherMarjette Melania [name], LRT/CTRS     Zachary AbedLindsay, [name] A 10/28/2017 11:13 AM

## 2017-10-28 NOTE — Progress Notes (Signed)
Recreation Therapy Notes  INPATIENT RECREATION TR PLAN  Patient Details Name: [name] MRN: [id number] DOB: 1996/03/01 Today's Date: 10/28/2017  Rec Therapy Plan Is patient appropriate for Therapeutic Recreation?: Yes Treatment times per week: about 3 days Estimated Length of Stay: 5-7 days TR Treatment/Interventions: Group participation (Comment)  Discharge Criteria Pt will be discharged from therapy if:: Discharged Treatment plan/goals/alternatives discussed and agreed upon by:: Patient/family  Discharge Summary Short term goals set: Patient will attend and participate in recreation therapy sessions. Short term goals met: Complete Progress toward goals comments: Groups attended Which groups?: Goal setting, Coping skills, Wellness, Other (Comment)(Team building) Reason goals not met: None Therapeutic equipment acquired: N/A Reason patient discharged from therapy: Discharge from hospital Pt/family agrees with progress & goals achieved: Yes Date patient discharged from therapy: 10/28/17    Victorino Sparrow, LRT/CTRS  Ria Comment, Cliffton Spradley A 10/28/2017, 12:01 PM
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
## 2017-10-28 NOTE — Progress Notes (Signed)
D: Assumed care of patient at [id number] from [place]. Patient observed walking/pacing slowly in the hallway. Affect flat, mood preoccupied. Continues to cover mouth. Cautious on approach. Per self inventory and discussions with writer, rates depression, hopelessness and anxiety all at a 0/10. Rates sleep as good, appetite as good, energy as normal and concentration as good.  States goal for today is "getting out of here, go to groups." Denies pain, physical complaints.   A: Medicated per orders this AM, no prns requested or required. Level III obs in place for safety. Emotional support offered and self inventory reviewed. Encouraged completion of Suicide Safety Plan and programming participation. Discussed POC with MD, SW by AM RN.   R: Patient verbalizes understanding of POC. Patient denies SI/HI/AVH and remains safe on level III obs. Will continue to monitor closely and make verbal contact frequently.

## 2017-10-28 NOTE — Plan of Care (Signed)
Pt attended and participated in goals, coping skills and wellness recreation therapy group sessions.   Caroll RancherMarjette Saatvik Thielman, LRT/CTRS

## 2019-04-13 ENCOUNTER — Observation Stay (HOSPITAL_COMMUNITY)
Admission: RE | Admit: 2019-04-13 | Discharge: 2019-04-14 | Disposition: A | Discharge status: Home / Self Care | Payer: Medicaid Other | Attending: Psychiatry | Admitting: Psychiatry

## 2019-04-13 ENCOUNTER — Other Ambulatory Visit: Payer: Self-pay

## 2019-04-13 ENCOUNTER — Encounter (HOSPITAL_COMMUNITY): Payer: Self-pay

## 2019-04-13 DIAGNOSIS — F17210 Nicotine dependence, cigarettes, uncomplicated: Secondary | ICD-10-CM | POA: Insufficient documentation

## 2019-04-13 DIAGNOSIS — F250 Schizoaffective disorder, bipolar type: Principal | ICD-10-CM | POA: Diagnosis present

## 2019-04-13 DIAGNOSIS — R4689 Other symptoms and signs involving appearance and behavior: Secondary | ICD-10-CM | POA: Diagnosis present

## 2019-04-13 DIAGNOSIS — F909 Attention-deficit hyperactivity disorder, unspecified type: Secondary | ICD-10-CM | POA: Diagnosis not present

## 2019-04-13 DIAGNOSIS — Z885 Allergy status to narcotic agent status: Secondary | ICD-10-CM | POA: Insufficient documentation

## 2019-04-13 DIAGNOSIS — Z818 Family history of other mental and behavioral disorders: Secondary | ICD-10-CM | POA: Insufficient documentation

## 2019-04-13 DIAGNOSIS — F1220 Cannabis dependence, uncomplicated: Secondary | ICD-10-CM | POA: Diagnosis present

## 2019-04-13 MED ORDER — ARIPIPRAZOLE 5 MG PO TABS
5.0000 mg | ORAL_TABLET | ORAL | Status: DC
  Administered 2019-04-14: 5 mg via ORAL
  Filled 2019-04-13: qty 1

## 2019-04-13 MED ORDER — HYDROXYZINE HCL 25 MG PO TABS: 25.0000 mg | ORAL_TABLET | Freq: Three times a day (TID) | ORAL | Status: DC | PRN

## 2019-04-13 MED ORDER — TRAZODONE HCL 100 MG PO TABS: 100.0000 mg | ORAL_TABLET | Freq: Every evening | ORAL | Status: DC | PRN

## 2019-04-13 MED ORDER — NICOTINE POLACRILEX 2 MG MT GUM: 2.0000 mg | CHEWING_GUM | OROMUCOSAL | Status: DC | PRN

## 2019-04-13 NOTE — H&P (Signed)
BH Observation Unit Provider Admission PAA/H&P  Patient Identification: [name] MRN:  [id number] Date of Evaluation:  04/13/2019 Chief Complaint:  Schizoaffective disorder Principal Diagnosis: Aggressive behavior Diagnosis:  Principal Problem:   Aggressive behavior Active Problems:   Cannabis use disorder, severe, dependence (HCC)   Schizoaffective disorder, bipolar type (HCC)  History of Present Illness: [name] is an 23 y.o. male patient [facility] as walk in under IVC with complaints of aggressive behavior and communicating threats to harm sister and mother.  Patient denies suicidal/self-harm/homicidal ideation, psychosis, and paranoia. Patient states that he lives alone in a motel room.  States that he has not threaten anyone.  "I had to send in a picture of my ID card to get my stimulus check and it is suppose to be coming around the end of this month of the first week of July.  They just want me in the hospital so they can spend my money.     Associated Signs/Symptoms: Depression Symptoms:  Denies (Hypo) Manic Symptoms:  Denies Anxiety Symptoms:  Denies Psychotic Symptoms:  Denies PTSD Symptoms: Denies Total Time spent with patient: 30 minutes  Past Psychiatric History: Schizophrenia   Is the patient at risk to self? No.  Has the patient been a risk to self in the past 6 months? No.  Has the patient been a risk to self within the distant past? No.  Is the patient a risk to others? No.  Has the patient been a risk to others in the past 6 months? No.  Has the patient been a risk to others within the distant past? No.   Prior Inpatient Therapy:   Prior Outpatient Therapy:    Alcohol Screening:   Substance Abuse History in the last 12 months:  No. Consequences of Substance Abuse: Angelique BlonderDenise Previous Psychotropic Medications: Yes  Psychological Evaluations: Yes  Past Medical History:  Past Medical History:  Diagnosis Date  . ADHD (attention deficit  hyperactivity disorder)   . Schizophrenia (HCC)    No past surgical history on file. Family History:  Family History  Problem Relation Age of Onset  . Bipolar disorder Mother   . Diabetes Mother   . Hypertension Mother    Family Psychiatric History: Unaware  Tobacco Screening:   Social History:  Social History   Substance and Sexual Activity  Alcohol Use No   Comment: denies alcohol usag     Social History   Substance and Sexual Activity  Drug Use Yes  . Types: Marijuana   Comment: everyday    Additional Social History:    Allergies:   Allergies  Allergen Reactions  . Morphine And Related Hives    Per mother    Lab Results: No results found for this or any previous visit (from the past 48 hour(s)).  Blood Alcohol level:  Lab Results  Component Value Date   ETH <10 10/20/2017   ETH <5 06/24/2017    Metabolic Disorder Labs:  Lab Results  Component Value Date   HGBA1C 5.4 06/16/2017   MPG 108.28 06/16/2017   MPG 103 02/27/2017   Lab Results  Component Value Date   PROLACTIN 2.3 (L) 06/16/2017   Lab Results  Component Value Date   CHOL 156 06/16/2017   TRIG 75 06/16/2017   HDL 47 06/16/2017   CHOLHDL 3.3 06/16/2017   VLDL 15 06/16/2017   LDLCALC 94 06/16/2017   LDLCALC 109 (H) 02/27/2017    Current Medications: No current facility-administered medications for  this encounter.    PTA Medications: Medications Prior to Admission  Medication Sig Dispense Refill Last Dose  . ARIPiprazole (ABILIFY) 5 MG tablet Take 1 tablet (5 mg) by mouth in at breakfast & 2 tablets (10 mg) at bedtime: For mood control 90 tablet 0   . ARIPiprazole ER 400 MG SRER Inject 400 mg into the muscle every 30 (thirty) days. (Due on 11-22-17): For mood control 1 each 0   . hydrOXYzine (ATARAX/VISTARIL) 25 MG tablet Take 1 tablet (25 mg total) by mouth 3 (three) times daily as needed for anxiety. 60 tablet 0   . nicotine polacrilex (NICORETTE) 2 MG gum Take 1 each (2 mg total)  by mouth as needed for smoking cessation. (May purchase from over the counter): For smoking cessation 100 tablet 0   . traZODone (DESYREL) 100 MG tablet Take 1 tablet (100 mg total) by mouth at bedtime as needed for sleep. 30 tablet 0     Psychiatric Specialty Exam: Physical Exam  Nursing note and vitals reviewed. Constitutional: He is oriented to person, place, and time. He appears well-developed and well-nourished. No distress.  Neck: Normal range of motion.  Respiratory: Effort normal.  Musculoskeletal: Normal range of motion.  Neurological: He is alert and oriented to person, place, and time.  Skin: Skin is warm and dry.  Psychiatric: He has a normal mood and affect. His speech is normal and behavior is normal. Thought content normal. Cognition and memory are normal.    Review of Systems  Psychiatric/Behavioral: Depression: Denies. Hallucinations: Denies. Substance abuse: Denies. Suicidal ideas: Denies. Nervous/anxious: Denies.        Patient states that he has ACTT team that comes once week on Monday with medications   All other systems reviewed and are negative.   There were no vitals taken for this visit.There is no height or weight on file to calculate BMI.  General Appearance: Casual  Eye Contact:  Good  Speech:  Clear and Coherent and Normal Rate  Volume:  Normal  Mood:  Appropriate  Affect:  Appropriate and Congruent  Thought Process:  Coherent and Goal Directed  Orientation:  Full (Time, Place, and Person)  Thought Content:  WDL  Suicidal Thoughts:  No  Homicidal Thoughts:  No  Memory:  Immediate;   Good Recent;   Good  Judgement:  Intact  Insight:  Present  Psychomotor Activity:  Normal  Concentration: Concentration: Good  Recall:  Good  Fund of Knowledge:Fair  Language: Good  Akathisia:  No  Handed:  Right  AIMS (if indicated):     Assets:  Communication Skills Desire for Improvement Housing Social Support  Sleep:       Musculoskeletal: Strength  & Muscle Tone: within normal limits Gait & Station: normal Patient leans: N/A  There were no vitals taken for this visit.  Recommendations:   Observation over night  Based on my evaluation the patient       Earleen Newport, NP 6/30/20207:28 PM

## 2019-04-13 NOTE — H&P (Signed)
[place] Screening Exam  BERNARD SLAYDEN is an 23 y.o. male patient presents to [facility] as walk in under IVC with complaints of aggressive behavior and communicating threats to harm sister and mother.  Patient denies suicidal/self-harm/homicidal ideation, psychosis, and paranoia. Patient states that he lives alone in a motel room.  States that he has not threaten anyone.  "I had to send in a picture of my ID card to get my stimulus check and it is suppose to be coming around the end of this month of the first week of July.  They just want me in the hospital so they can spend my money.    Total Time spent with patient: 30 minutes  Psychiatric Specialty Exam: Physical Exam  Nursing note and vitals reviewed. Constitutional: He is oriented to person, place, and time. He appears well-developed and well-nourished. No distress.  Neck: Normal range of motion.  Respiratory: Effort normal.  Musculoskeletal: Normal range of motion.  Neurological: He is alert and oriented to person, place, and time.  Skin: Skin is warm and dry.  Psychiatric: He has a normal mood and affect. His speech is normal and behavior is normal. Thought content normal. Cognition and memory are normal.    Review of Systems  Psychiatric/Behavioral: Depression: Denies. Hallucinations: Denies. Substance abuse: Denies. Suicidal ideas: Denies. Nervous/anxious: Denies.        Patient states that he has ACTT team that comes once week on Monday with medications   All other systems reviewed and are negative.   There were no vitals taken for this visit.There is no height or weight on file to calculate BMI.  General Appearance: Casual  Eye Contact:  Good  Speech:  Clear and Coherent and Normal Rate  Volume:  Normal  Mood:  Appropriate  Affect:  Appropriate and Congruent  Thought Process:  Coherent and Goal Directed  Orientation:  Full (Time, Place, and Person)  Thought Content:  WDL  Suicidal Thoughts:  No  Homicidal  Thoughts:  No  Memory:  Immediate;   Good Recent;   Good  Judgement:  Intact  Insight:  Present  Psychomotor Activity:  Normal  Concentration: Concentration: Good  Recall:  Good  Fund of Knowledge:Fair  Language: Good  Akathisia:  No  Handed:  Right  AIMS (if indicated):     Assets:  Communication Skills Desire for Improvement Housing Social Support  Sleep:       Musculoskeletal: Strength & Muscle Tone: within normal limits Gait & Station: normal Patient leans: N/A  There were no vitals taken for this visit.  Recommendations:   Observation over night  Based on my evaluation the patient does not appear to have an emergency medical condition.  Krystan Northrop, NP 04/13/2019, 7:12 PM
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
## 2019-04-14 ENCOUNTER — Encounter (HOSPITAL_COMMUNITY): Payer: Self-pay | Admitting: Registered Nurse

## 2019-04-14 DIAGNOSIS — F250 Schizoaffective disorder, bipolar type: Secondary | ICD-10-CM

## 2019-04-14 MED ORDER — ARIPIPRAZOLE ER 400 MG IM SRER
400.0000 mg | INTRAMUSCULAR | Status: DC
  Administered 2019-04-14: 400 mg via INTRAMUSCULAR

## 2019-04-14 NOTE — Progress Notes (Signed)
Patient sleeping until recently when he was taking a shower.  Patient calm and cooperative and participated in his assessment by psychiatrist and nurse practitioner.  He denies that he didn't want to see his ACTT team and said he was asleep when they came and just did not go to the door.  Patient denies any SI, HI, or AVH.  Patient said he has not had his monthly abilify shot.  Administered medications as ordered and continues to support patient toward his goals.  Q 15 min checks maintained for patient safety.

## 2019-04-14 NOTE — Discharge Summary (Addendum)
[facility]  04/14/2019 12:18 PM Orlan LeavensJohn C Molnar  MRN:  [id number] Principal Problem: Schizoaffective disorder, bipolar type [facility](HCC) Discharge Diagnoses: Principal Problem:   Schizoaffective disorder, bipolar type (HCC) Active Problems:   Cannabis use disorder, severe, dependence (HCC)   Aggressive behavior  HPI:  Patient [facility] a [facility] as walk in via police under IVC by his sister with complaints of aggression and communicating threats.  Patient denied all accusations stating they just wanted "me in here cause they know its about time for my stimulus check to come so they can get it and spend it."   Subjective: Patient states that he feels good, slept well, and took medications as ordered.  Reports he refused his labs because "That lady that came in here early in the morning said I could refuse if I wanted to and I didn't have to do it.  So I said no and went back to sleep."  Patient seen via tele psych by this provider, Dr. Sharma CovertNorman; and chart reviewed on 04/14/19.  On evaluation Orlan LeavensJohn C Sirianni reports he slept well,.  States that he feels good and has been cooperative.  Patient states that he did not know that his ACTT team had came by yesterday "I was sleep.  I didn't know they came."  Patient denies suicidal/self-harm/homicidal ideation, psychosis, and paranoia.  States that he is compliant with his medications at home and states that his next Abilify injection is due July 1st.  Nursing reports that there has been no problems out of patient other that not getting labs this morning in which patient felt he didn't need to do at that time since the lab tech told him he could refuse.    During evaluation Orlan LeavensJohn C Traeger is standing after getting out of shower; he is alert/oriented x 4; calm/cooperative; and mood congruent with affect.  Patient is speaking in a clear tone at moderate volume, and normal pace; with good eye contact.  His thought process is coherent and  relevant; There is no indication that he is currently responding to internal/external stimuli or experiencing delusional thought content.  Patient denies suicidal/self-harm/homicidal ideation, psychosis, and paranoia.  Patient has remained calm throughout assessment and has answered questions appropriately.  ACTT team notified of patient disposition.   Total Time spent with patient: 30 minutes  Past Psychiatric History:  Schizoaffective disorder  Past Medical History:  Past Medical History:  Diagnosis Date  . ADHD (attention deficit hyperactivity disorder)   . Schizophrenia (HCC)    History reviewed. No pertinent surgical history. Family History:  Family History  Problem Relation Age of Onset  . Bipolar disorder Mother   . Diabetes Mother   . Hypertension Mother    Family Psychiatric  History: See above list Social History:  Social History   Substance and Sexual Activity  Alcohol Use No   Comment: denies alcohol usag     Social History   Substance and Sexual Activity  Drug Use Yes  . Types: Marijuana   Comment: everyday    Social History   Socioeconomic History  . Marital status: Single    Spouse name: Not on file  . Number of children: Not on file  . Years of education: Not on file  . Highest education level: Not on file  Occupational History  . Not on file  Social Needs  . Financial resource strain: Not on file  . Food insecurity    Worry: Not on file  Inability: Not on file  . Transportation needs    Medical: Not on file    Non-medical: Not on file  Tobacco Use  . Smoking status: Current Every Day Smoker    Packs/day: 0.50    Types: Cigarettes  . Smokeless tobacco: Never Used  Substance and Sexual Activity  . Alcohol use: No    Comment: denies alcohol usag  . Drug use: Yes    Types: Marijuana    Comment: everyday  . Sexual activity: Yes    Birth control/protection: Condom  Lifestyle  . Physical activity    Days per week: Not on file    Minutes  per session: Not on file  . Stress: Not on file  Relationships  . Social Musicianconnections    Talks on phone: Not on file    Gets together: Not on file    Attends religious service: Not on file    Active member of club or organization: Not on file    Attends meetings of clubs or organizations: Not on file    Relationship status: Not on file  Other Topics Concern  . Not on file  Social History Narrative  . Not on file    Has this patient used any form of tobacco in the last 30 days? (Cigarettes, Smokeless Tobacco, Cigars, and/or Pipes) A prescription for an FDA-approved tobacco cessation medication was offered at discharge and the patient refused and Prescription not provided because: already has prescription  Current Medications: Current Facility-Administered Medications  Medication Dose Route Frequency Provider Last Rate Last Dose  . ARIPiprazole (ABILIFY) tablet 5 mg  5 mg Oral BH-q7a Rankin, Shuvon B, NP   5 mg at 04/14/19 0815  . ARIPiprazole ER (ABILIFY MAINTENA) injection 400 mg  400 mg Intramuscular Q28 days Rankin, Shuvon B, NP      . hydrOXYzine (ATARAX/VISTARIL) tablet 25 mg  25 mg Oral TID PRN Rankin, Shuvon B, NP      . nicotine polacrilex (NICORETTE) gum 2 mg  2 mg Oral PRN Rankin, Shuvon B, NP      . traZODone (DESYREL) tablet 100 mg  100 mg Oral QHS PRN Rankin, Shuvon B, NP       PTA Medications: Medications Prior to Admission  Medication Sig Dispense Refill Last Dose  . ARIPiprazole (ABILIFY) 5 MG tablet Take 1 tablet (5 mg) by mouth in at breakfast & 2 tablets (10 mg) at bedtime: For mood control (Patient not taking: Reported on 04/14/2019) 90 tablet 0 Not Taking at Unknown time  . ARIPiprazole ER 400 MG SRER Inject 400 mg into the muscle every 30 (thirty) days. (Due on 11-22-17): For mood control (Patient not taking: Reported on 04/14/2019) 1 each 0 Not Taking at Unknown time  . hydrOXYzine (ATARAX/VISTARIL) 25 MG tablet Take 1 tablet (25 mg total) by mouth 3 (three) times  daily as needed for anxiety. (Patient not taking: Reported on 04/14/2019) 60 tablet 0 Not Taking at Unknown time  . nicotine polacrilex (NICORETTE) 2 MG gum Take 1 each (2 mg total) by mouth as needed for smoking cessation. (May purchase from over the counter): For smoking cessation (Patient not taking: Reported on 04/14/2019) 100 tablet 0 Not Taking at Unknown time  . traZODone (DESYREL) 100 MG tablet Take 1 tablet (100 mg total) by mouth at bedtime as needed for sleep. (Patient not taking: Reported on 04/14/2019) 30 tablet 0 Not Taking at Unknown time    Musculoskeletal: Strength & Muscle Tone: within normal limits Gait & Station:  normal Patient leans: N/A  Psychiatric Specialty Exam: Physical Exam  Nursing note and vitals reviewed. Constitutional: He is oriented to person, place, and time. He appears well-developed and well-nourished. No distress.  Neck: Normal range of motion.  Respiratory: Effort normal.  Musculoskeletal: Normal range of motion.  Neurological: He is alert and oriented to person, place, and time.  Skin: Skin is warm and dry.  Psychiatric: He has a normal mood and affect. His behavior is normal. Judgment and thought content normal.    Review of Systems  Psychiatric/Behavioral: Depression: Stable. Hallucinations: Denies. Substance abuse: Denies. Suicidal ideas: Denies. Nervous/anxious: Denies. Insomnia: Denies.   All other systems reviewed and are negative.   Blood pressure 117/74, pulse 65, temperature 98.4 F (36.9 C), temperature source Oral, resp. rate 16, SpO2 100 %.There is no height or weight on file to calculate BMI.  General Appearance: Casual  Eye Contact:  Good  Speech:  Clear and Coherent and Normal Rate  Volume:  Normal  Mood:  Appropriate "Good"   Affect:  Appropriate and Congruent  Thought Process:  Coherent, Goal Directed and Descriptions of Associations: Intact  Orientation:  Full (Time, Place, and Person)  Thought Content:  WDL  Suicidal Thoughts:   No  Homicidal Thoughts:  No  Memory:  Immediate;   Good Recent;   Good Remote;   Good  Judgement:  Intact  Insight:  Present  Psychomotor Activity:  Normal  Concentration:  Concentration: Good and Attention Span: Good  Recall:  Good  Fund of Knowledge:  Fair  Language:  Good  Akathisia:  No  Handed:  Right  AIMS (if indicated):   N/A  Assets:  Communication Skills Desire for Improvement Housing Social Support  ADL's:  Intact  Cognition:  WNL  Sleep:   N/A     Demographic Factors:  Male  Loss Factors: Denies (None)  Historical Factors: Family history of mental illness or substance abuse  Risk Reduction Factors:   Positive social support and Positive therapeutic relationship  Continued Clinical Symptoms:  Previous Psychiatric Diagnoses and Treatments  Cognitive Features That Contribute To Risk:  None    Suicide Risk:  Minimal: No identifiable suicidal ideation.  Patients presenting with no risk factors but with morbid ruminations; may be classified as minimal risk based on the severity of the depressive symptoms    Plan Of Care/Follow-up recommendations:  Activity:  As tolerated Diet:  Heart healthy Other:  Follow up with [facility] and ACTT team  Disposition: No evidence of imminent risk to self or others at present.   Patient does not meet criteria for psychiatric inpatient admission. Supportive therapy provided about ongoing stressors. Discussed crisis plan, support from social network, calling 911, coming to the Emergency Department, and calling Suicide Hotline.  Shuvon Rankin, NP 04/14/2019, 12:18 PM   Patient seen face-to-face for psychiatric evaluation, chart reviewed and case discussed with the physician extender and developed treatment plan. Reviewed the information documented and agree with the treatment plan.  Buford Dresser, DO 04/14/19 3:29 PM

## 2019-04-14 NOTE — Progress Notes (Signed)
[name] is a 23 year old male being admitted involuntarily to [facility] Obs unit room 203.  He was brought in by [facility] [name] under IVC initiated by sister for med noncompliance, not taking care of personal needs, threatening family and abusing drugs.  He has history of Schizophrenia and is on an ACT team through [facility].  During Obs admission, [name] was guarded and minimal.  He denied SI/HI or A/V hallucinations.  He denied any pain or discomfort and appeared to be in no physical distress.  Oriented him to the unit.  BH-OBS paperwork completed and signed.  Belongings secured in tamper resistant bag and placed in locker # 7 in the C/A locker room.  No contraband found.  Skin assessment completed and no skin issues noted.  Q 15 minute checks initiated for safety.

## 2019-04-14 NOTE — Progress Notes (Signed)
When brought to his room, he became upset and believes that there are "bars on the window, I don't want to be here."  He declined needing medications and proceeded to lay down. Specimen cup provided to obtain a urine specimen. He is currently resting with his eyes closed and appears to be asleep.

## 2019-04-14 NOTE — Plan of Care (Signed)
[facility] Observation Crisis Plan  Reason for Crisis Plan:  Chronic Mental Illness/Medical Illness and Crisis Stabilization   Plan of Care:  Referral for Telepsychiatry/Psychiatric Consult  Family Support:      Current Living Environment:  Living Arrangements: Alone  Insurance:   Hospital Account    Name Acct ID Class Status Primary Coverage   [name], [name] [id number] [place]        Guarantor Account (for Hospital Account [id number])    Name Relation to Pt Service Area Active? Acct Type   [name], [name]   Address Phone       [address] [place], [place] [address] [phone])          Coverage Information (for Hospital Account [id number])    F/O Payor/Plan Precert #   [facility] MEDICAID/[place] MEDICA[place]    Subscriber Subscriber #   [name], [name] [id number]   Address Phone   [address], [place] [address] [phone]      Legal Guardian:     Primary Care Provider:  Patient, No Pcp Per  Current Outpatient Providers:  [facility]  Psychiatrist:  Name of Psychiatrist: [facility]  Counselor/Therapist:  Name of Therapist: [name], [name]  [phone]  Compliant with Medications:  No  Additional Information:   [name] 7/1/202012:57 AM

## 2019-04-14 NOTE — BH Assessment (Signed)
[facility] Assessment Progress Note  Per Buford Dresser, DO, this pt does not require psychiatric hospitalization at this time.  Pt presents under IVC initiated by pt's sister which Dr Mariea Clonts has rescinded.  Pt is to be discharged from the [facility] Observation Unit with recommendation to continue treatment with the [facility] Team.  Please note that other EPIC entries incorrectly identify pt's ACT Team as being with [facility].  [facility] ACT Team recommendation has been included in pt's discharge instructions.  Pt is to be administered injectable medication, for which he is due today, before discharge.  Pt's nurse, Caren Griffins, has been notified.  At 11:34 this writer called Jeanett Schlein [phone]), who identifies herself as working with pt through the [facility].  I notified her of pt's disposition.  Jalene Mullet, [place] Triage Specialist [phone]

## 2019-04-14 NOTE — Discharge Instructions (Signed)
For your mental health needs, you are advised to continue treatment with the [facility] ACT Team.  Contact them at your earliest opportunity:       [place]      [place], [place] [address]      [phone]      Crisis number: [phone]

## 2019-04-14 NOTE — Progress Notes (Signed)
Patient discharged with instructions to follow up with [facility] for his psychiatric treatment.  Patient given his 28 day Abilify injection before he left.  Patient denied SI, HI, and AVH.  Patient to use the bus for transport home.

## 2022-02-06 ENCOUNTER — Other Ambulatory Visit: Payer: Self-pay

## 2022-02-06 ENCOUNTER — Emergency Department (HOSPITAL_COMMUNITY)
Admission: EM | Admit: 2022-02-06 | Discharge: 2022-02-07 | Disposition: A | Discharge status: Home / Self Care | Payer: Medicare Other | Attending: Emergency Medicine | Admitting: Emergency Medicine

## 2022-02-06 ENCOUNTER — Encounter (HOSPITAL_COMMUNITY): Payer: Self-pay | Admitting: Oncology

## 2022-02-06 DIAGNOSIS — Z79899 Other long term (current) drug therapy: Secondary | ICD-10-CM | POA: Diagnosis not present

## 2022-02-06 DIAGNOSIS — R456 Violent behavior: Secondary | ICD-10-CM | POA: Diagnosis present

## 2022-02-06 DIAGNOSIS — R451 Restlessness and agitation: Secondary | ICD-10-CM | POA: Diagnosis not present

## 2022-02-06 DIAGNOSIS — R4689 Other symptoms and signs involving appearance and behavior: Secondary | ICD-10-CM

## 2022-02-06 DIAGNOSIS — F1410 Cocaine abuse, uncomplicated: Secondary | ICD-10-CM

## 2022-02-06 DIAGNOSIS — Z20822 Contact with and (suspected) exposure to covid-19: Secondary | ICD-10-CM | POA: Insufficient documentation

## 2022-02-06 DIAGNOSIS — F1910 Other psychoactive substance abuse, uncomplicated: Secondary | ICD-10-CM

## 2022-02-06 DIAGNOSIS — F1994 Other psychoactive substance use, unspecified with psychoactive substance-induced mood disorder: Secondary | ICD-10-CM

## 2022-02-06 LAB — CBC WITH DIFFERENTIAL/PLATELET
Abs Immature Granulocytes: 0.03 10*3/uL (ref 0.00–0.07)
Basophils Absolute: 0.1 10*3/uL (ref 0.0–0.1)
Basophils Relative: 0 %
Eosinophils Absolute: 0.1 10*3/uL (ref 0.0–0.5)
Eosinophils Relative: 1 %
HCT: 40.5 % (ref 39.0–52.0)
Hemoglobin: 13.2 g/dL (ref 13.0–17.0)
Immature Granulocytes: 0 %
Lymphocytes Relative: 15 %
Lymphs Abs: 2 10*3/uL (ref 0.7–4.0)
MCH: 30.3 pg (ref 26.0–34.0)
MCHC: 32.6 g/dL (ref 30.0–36.0)
MCV: 93.1 fL (ref 80.0–100.0)
Monocytes Absolute: 0.8 10*3/uL (ref 0.1–1.0)
Monocytes Relative: 6 %
Neutro Abs: 10 10*3/uL — ABNORMAL HIGH (ref 1.7–7.7)
Neutrophils Relative %: 78 %
Platelets: 414 10*3/uL — ABNORMAL HIGH (ref 150–400)
RBC: 4.35 MIL/uL (ref 4.22–5.81)
RDW: 14.9 % (ref 11.5–15.5)
WBC: 12.9 10*3/uL — ABNORMAL HIGH (ref 4.0–10.5)
nRBC: 0 % (ref 0.0–0.2)

## 2022-02-06 LAB — COMPREHENSIVE METABOLIC PANEL
ALT: 17 U/L (ref 0–44)
AST: 26 U/L (ref 15–41)
Albumin: 3.9 g/dL (ref 3.5–5.0)
Alkaline Phosphatase: 53 U/L (ref 38–126)
Anion gap: 6 (ref 5–15)
BUN: 21 mg/dL — ABNORMAL HIGH (ref 6–20)
CO2: 24 mmol/L (ref 22–32)
Calcium: 9 mg/dL (ref 8.9–10.3)
Chloride: 107 mmol/L (ref 98–111)
Creatinine, Ser: 0.89 mg/dL (ref 0.61–1.24)
GFR, Estimated: >60 mL/min (ref >60)
Glucose, Bld: 98 mg/dL (ref 70–99)
Potassium: 4.6 mmol/L (ref 3.5–5.1)
Sodium: 137 mmol/L (ref 135–145)
Total Bilirubin: 0.6 mg/dL (ref 0.3–1.2)
Total Protein: 7.9 g/dL (ref 6.5–8.1)

## 2022-02-06 LAB — ETHANOL: Alcohol, Ethyl (B): <10 mg/dL (ref <10)

## 2022-02-06 LAB — RESP PANEL BY RT-PCR (FLU A&B, COVID) ARPGX2
Influenza A by PCR: NEGATIVE
Influenza B by PCR: NEGATIVE
SARS Coronavirus 2 by RT PCR: NEGATIVE

## 2022-02-06 LAB — RAPID URINE DRUG SCREEN, HOSP PERFORMED
Amphetamines: POSITIVE — AB
Barbiturates: NOT DETECTED
Benzodiazepines: NOT DETECTED
Cocaine: POSITIVE — AB
Opiates: NOT DETECTED
Tetrahydrocannabinol: POSITIVE — AB

## 2022-02-06 MED ORDER — STERILE WATER FOR INJECTION IJ SOLN
INTRAMUSCULAR | Status: AC
  Administered 2022-02-06: 2.1 mL
  Filled 2022-02-06: qty 10

## 2022-02-06 MED ORDER — ZIPRASIDONE MESYLATE 20 MG IM SOLR
20.0000 mg | Freq: Once | INTRAMUSCULAR | Status: AC
Start: 2022-02-06 — End: 2022-02-06
  Administered 2022-02-06: 20 mg via INTRAMUSCULAR
  Filled 2022-02-06: qty 20

## 2022-02-06 NOTE — ED Notes (Signed)
Pt verbally aggressive towards nursing staff and security. Pt yells at MD Messick screaming profanities and racial slurs.  ?

## 2022-02-06 NOTE — ED Notes (Signed)
Pt resting comfortable in bed at this time. All comfort and safety measures in place. [employer] at bedside. ?

## 2022-02-06 NOTE — ED Provider Notes (Signed)
?[facility]-EMERGENCY DEPT ?Provider Note ? ? ?CSN: [id number] ?Arrival date & time: 02/06/22  1401 ? ?  ? ?History ? ?Chief Complaint  ?Patient presents with  ? Medical Clearance  ? ? ?[name] is a 26 y.o. male. ? ?26 year old male with prior medical history as detailed below presents for evaluation.  Patient arrives with IVC hold initiated by law enforcement. ? ?Patient with erratic and aggressive behavior per [employer].  Patient with charges pending for trespassing.  Patient is significantly aggressive on initial exam.  He repeatedly calls this examiner "a nigger motherfucker". ? ?Additional history is unable to be obtained from the patient. ? ?The history is provided by the patient and medical records.  ?Illness ?Location:  Aggression, agitated behaviors, IVC hold ?Severity:  Severe ?Onset quality:  Unable to specify ?Timing:  Unable to specify ?Progression:  Unable to specify ? ?  ? ?Home Medications ?Prior to Admission medications   ?Medication Sig Start Date End Date Taking? Authorizing Provider  ?ARIPiprazole ER 400 MG SRER Inject 400 mg into the muscle every 30 (thirty) days. (Due on 11-22-17): For mood control ?Patient not taking: Reported on 04/14/2019 11/22/17   Armandina Stammer I, NP  ?hydrOXYzine (ATARAX/VISTARIL) 25 MG tablet Take 1 tablet (25 mg total) by mouth 3 (three) times daily as needed for anxiety. ?Patient not taking: Reported on 04/14/2019 10/28/17   Armandina Stammer I, NP  ?nicotine polacrilex (NICORETTE) 2 MG gum Take 1 each (2 mg total) by mouth as needed for smoking cessation. (May purchase from over the counter): For smoking cessation ?Patient not taking: Reported on 04/14/2019 10/28/17   Armandina Stammer I, NP  ?traZODone (DESYREL) 100 MG tablet Take 1 tablet (100 mg total) by mouth at bedtime as needed for sleep. ?Patient not taking: Reported on 04/14/2019 10/28/17   Sanjuana Kava, NP  ?   ? ?Allergies    ?Morphine and related   ? ?Review of Systems   ?Review of Systems  ?All other systems  reviewed and are negative. ? ?Physical Exam ?Updated Vital Signs ?BP 120/69 (BP Location: Left Arm)   Pulse 85   Temp 98 ?F (36.7 ?C) (Oral)   Resp 18   SpO2 94%  ?Physical Exam ?Vitals and nursing note reviewed.  ?Constitutional:   ?   General: He is not in acute distress. ?   Appearance: Normal appearance. He is well-developed.  ?HENT:  ?   Head: Normocephalic and atraumatic.  ?Eyes:  ?   Conjunctiva/sclera: Conjunctivae normal.  ?   Pupils: Pupils are equal, round, and reactive to light.  ?Cardiovascular:  ?   Rate and Rhythm: Normal rate and regular rhythm.  ?   Heart sounds: Normal heart sounds.  ?Pulmonary:  ?   Effort: Pulmonary effort is normal. No respiratory distress.  ?   Breath sounds: Normal breath sounds.  ?Abdominal:  ?   General: There is no distension.  ?   Palpations: Abdomen is soft.  ?   Tenderness: There is no abdominal tenderness.  ?Musculoskeletal:     ?   General: No deformity. Normal range of motion.  ?   Cervical back: Normal range of motion and neck supple.  ?Skin: ?   General: Skin is warm and dry.  ?Neurological:  ?   General: No focal deficit present.  ?   Mental Status: He is alert.  ?Psychiatric:  ?   Comments: Agitated, aggressive, noncompliant with attempts to examine ? ?Endorses active threats to harm staff  and [employer]  ? ? ?ED Results / Procedures / Treatments   ?Labs ?(all labs ordered are listed, but only abnormal results are displayed) ?Labs Reviewed  ?COMPREHENSIVE METABOLIC PANEL - Abnormal; Notable for the following components:  ?    Result Value  ? BUN 21 (*)   ? All other components within normal limits  ?RAPID URINE DRUG SCREEN, HOSP PERFORMED - Abnormal; Notable for the following components:  ? Cocaine POSITIVE (*)   ? Amphetamines POSITIVE (*)   ? Tetrahydrocannabinol POSITIVE (*)   ? All other components within normal limits  ?CBC WITH DIFFERENTIAL/PLATELET - Abnormal; Notable for the following components:  ? WBC 12.9 (*)   ? Platelets 414 (*)   ? Neutro Abs 10.0 (*)    ? All other components within normal limits  ?RESP PANEL BY RT-PCR (FLU A&B, COVID) ARPGX2  ?ETHANOL  ? ? ?EKG ?EKG Interpretation ? ?Date/Time:  Wednesday February 06 2022 15:48:20 EDT ?Ventricular Rate:  87 ?PR Interval:  165 ?QRS Duration: 92 ?QT Interval:  364 ?QTC Calculation: 438 ?R Axis:   58 ?Text Interpretation: Sinus rhythm Confirmed by Kristine RoyalMessick, Marshal Eskew [phone]) on 02/06/2022 4:08:49 PM ? ?Radiology ?No results found. ? ?Procedures ?Procedures  ? ? ?Medications Ordered in ED ?Medications  ?ziprasidone (GEODON) injection 20 mg (20 mg Intramuscular Given 02/06/22 1600)  ?sterile water (preservative free) injection (2.1 mLs  Given 02/06/22 1600)  ? ? ?ED Course/ Medical Decision Making/ A&P ?  ?                        ?Medical Decision Making ? ? ?Medical Screen Complete ? ?This patient presented to the ED with complaint of agitation, erratic behaviors. ? ?This complaint involves an extensive number of treatment options. The initial differential diagnosis includes, but is not limited to, polysubstance abuse, mental health ? ?This presentation is: Acute, Chronic, Self-Limited, Previously Undiagnosed, Uncertain Prognosis, Complicated, Systemic Symptoms, and Threat to Life/Bodily Function ? ?Patient arrives on IVC hold initiated by [employer]. ? ?First exam completed by myself. ? ?Patient will require psychiatric evaluation and clearance. ? ?Medical work-up is without evidence of significant acute pathology.  Patient is medically clear at this time for psychiatric evaluation and treatment. ? ?Notably patient's urine tox screen is positive for cocaine, amphetamines, and THC. ? ?Additional history obtained: ? ?Additional history obtained from EMS ?External records from outside sources obtained and reviewed including prior ED visits and prior Inpatient records.  ? ? ?Lab Tests: ? ?I ordered and personally interpreted labs.  The pertinent results include: CBC, EtOH, CMP, urine tox ? ? ?Cardiac Monitoring: ? ?The patient was  maintained on a cardiac monitor.  I personally viewed and interpreted the cardiac monitor which showed an underlying rhythm of: NSR ? ? ?Medicines ordered: ? ?I ordered medication including geodon  for sedation ?Reevaluation of the patient after these medicines showed that the patient: improved ? ? ?Problem List / ED Course: ? ?Agitation /aggressive behavior ? ? ?Reevaluation: ? ?After the interventions noted above, I reevaluated the patient and found that they have: improved ? ? ?Disposition: ? ?After consideration of the diagnostic results and the patients response to treatment, I feel that the patent would benefit from theatric evaluation and treatment.  ? ? ? ? ? ? ? ? ?Final Clinical Impression(s) / ED Diagnoses ?Final diagnoses:  ?Aggressive behavior  ? ? ?Rx / DC Orders ?ED Discharge Orders   ? ? None  ? ?  ? ? ?  ?  Wynetta Fines, MD ?02/07/22 0001 ? ?

## 2022-02-06 NOTE — ED Triage Notes (Signed)
Pt bib [employer] s/p arrest d/t strange behavior. Pt talking through clenched teeth w/ strange behavior per jail staff. Here for medical clearance prior to going to back to jail.  ?

## 2022-02-06 NOTE — ED Provider Triage Note (Signed)
Emergency Medicine Provider Triage Evaluation Note ? ?[name] , a 26 y.o. male  was evaluated in triage.  Patient was brought in by [facility] for evaluation of "strange behavior.".  On evaluation, patient is sleeping and [employer] at bedside.  They report that he was arrested for trespassing and when asked what strange behavior he was exhibiting, they state that it was "difficult to articulate" and that patient was reportedly grunting and speaking through clenched teeth to the jail nurse who refused to accept him.  Patient was nonresponsive to verbal communication but he did rouse with sternal rub.  When asked if he knows his name, he mumbles "I don't know" and attempts to fall back asleep. he also does not know his birthday.  He denies being in pain.  Further history limited to patient condition. ?Review of Systems  ?Positive:  ?Negative:  ? ?Physical Exam  ?BP 120/69 (BP Location: Left Arm)   Pulse 85   Temp 98 ?F (36.7 ?C) (Oral)   Resp 18   SpO2 94%  ?Gen:   Sleeping, rousable with sternal rub ?Resp:  Normal effort  ?MSK:   Moves extremities without difficulty  ?Other:  Abdomen soft, nondistended, nontender to palpation in all quadrants without guarding or peritoneal signs ? ? ?Medical Decision Making  ?Medically screening exam initiated at 3:22 PM.  Appropriate orders placed.  [name] was informed that the remainder of the evaluation will be completed by another provider, this initial triage assessment does not replace that evaluation, and the importance of remaining in the ED until their evaluation is complete. ? ? ?  ?Janell Quiet, [place] ?02/06/22 1526 ? ?

## 2022-02-07 NOTE — ED Provider Notes (Signed)
Staff indicates law enforcement here to take to jail.  ? ?Pt's labs have been done - largely unremarkable, other than uds +amph, cocaine and thc. .  ? ?Pt alert, content, no acute distress, is breathing comfortably. Ambulates w steady gait.  Pt currently does not appear to be responding oto internal stimuli. ? ?Pt currently appears stable for d/c (in custody of [employer]).  ? ?Return precautions provided.  ? ? ?  ?Lajean Saver, MD ?02/07/22 0034 ? ?

## 2022-02-07 NOTE — ED Notes (Signed)
Mother Amil Amen called to get update on patient, informed of privacy policy but assured he was safe at this time. Confirmed contact information in patients chart.  ?

## 2022-02-07 NOTE — Discharge Instructions (Addendum)
It was our pleasure to provide your ER care today - we hope that you feel better. ? ?Drink plenty of fluids/stay well hydrated. ? ?Avoid cocaine and other drugs as they are harmful to your physical health and mental well-being. See resource guide provide in terms of accessing outpatient substance use treatment programs, social services, and behavioral health resources.  ? ?For mental health issues and/or crisis, you may also go directly to the [facility]  - it is open 24/7 and walk-ins are welcome.  ? ?Return to ER if worse, new symptoms, fevers, new/severe pain, chest pain, trouble breathing, or other concern.  ?

## 2024-02-19 ENCOUNTER — Other Ambulatory Visit: Payer: Self-pay

## 2024-02-19 ENCOUNTER — Emergency Department (HOSPITAL_COMMUNITY)
Admission: EM | Admit: 2024-02-19 | Discharge: 2024-02-20 | Disposition: A | Discharge status: Home / Self Care | Attending: Emergency Medicine | Admitting: Emergency Medicine

## 2024-02-19 DIAGNOSIS — S0081XA Abrasion of other part of head, initial encounter: Secondary | ICD-10-CM | POA: Insufficient documentation

## 2024-02-19 DIAGNOSIS — Z79899 Other long term (current) drug therapy: Secondary | ICD-10-CM | POA: Insufficient documentation

## 2024-02-19 DIAGNOSIS — W228XXA Striking against or struck by other objects, initial encounter: Secondary | ICD-10-CM | POA: Insufficient documentation

## 2024-02-19 DIAGNOSIS — F419 Anxiety disorder, unspecified: Secondary | ICD-10-CM | POA: Insufficient documentation

## 2024-02-19 DIAGNOSIS — Z23 Encounter for immunization: Secondary | ICD-10-CM | POA: Diagnosis not present

## 2024-02-19 DIAGNOSIS — R4182 Altered mental status, unspecified: Secondary | ICD-10-CM | POA: Insufficient documentation

## 2024-02-19 DIAGNOSIS — S90812A Abrasion, left foot, initial encounter: Secondary | ICD-10-CM | POA: Diagnosis not present

## 2024-02-19 DIAGNOSIS — S99922A Unspecified injury of left foot, initial encounter: Secondary | ICD-10-CM | POA: Diagnosis present

## 2024-02-19 DIAGNOSIS — F1410 Cocaine abuse, uncomplicated: Secondary | ICD-10-CM

## 2024-02-19 DIAGNOSIS — R451 Restlessness and agitation: Secondary | ICD-10-CM | POA: Insufficient documentation

## 2024-02-19 DIAGNOSIS — F259 Schizoaffective disorder, unspecified: Secondary | ICD-10-CM | POA: Diagnosis not present

## 2024-02-19 DIAGNOSIS — T07XXXA Unspecified multiple injuries, initial encounter: Secondary | ICD-10-CM

## 2024-02-19 MED ORDER — ZIPRASIDONE MESYLATE 20 MG IM SOLR
10.0000 mg | Freq: Once | INTRAMUSCULAR | Status: AC
  Administered 2024-02-19: 10 mg via INTRAMUSCULAR
  Filled 2024-02-19: qty 20

## 2024-02-19 MED ORDER — TETANUS-DIPHTH-ACELL PERTUSSIS 5-2.5-18.5 LF-MCG/0.5 IM SUSY
0.5000 mL | PREFILLED_SYRINGE | Freq: Once | INTRAMUSCULAR | Status: AC
  Administered 2024-02-19: 0.5 mL via INTRAMUSCULAR
  Filled 2024-02-19: qty 0.5
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
## 2024-02-19 NOTE — ED Notes (Signed)
 Pt allowed vitals but refused temperature and blood draw at this time.

## 2024-02-19 NOTE — ED Notes (Signed)
 Pt refusing vitals and medical care at this time.
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
## 2024-02-20 ENCOUNTER — Encounter (HOSPITAL_COMMUNITY): Payer: Self-pay

## 2024-02-20 ENCOUNTER — Emergency Department (HOSPITAL_COMMUNITY)

## 2024-02-20 DIAGNOSIS — S90812A Abrasion, left foot, initial encounter: Secondary | ICD-10-CM | POA: Diagnosis not present

## 2024-02-20 LAB — URINALYSIS, ROUTINE W REFLEX MICROSCOPIC
Bacteria, UA: NONE SEEN
Bilirubin Urine: NEGATIVE
Glucose, UA: NEGATIVE mg/dL
Ketones, ur: 5 mg/dL — AB
Leukocytes,Ua: NEGATIVE
Nitrite: NEGATIVE
Protein, ur: 30 mg/dL — AB
Specific Gravity, Urine: 1.017 (ref 1.005–1.030)
pH: 5 (ref 5.0–8.0)

## 2024-02-20 LAB — CBC
HCT: 40.5 % (ref 39.0–52.0)
Hemoglobin: 13.5 g/dL (ref 13.0–17.0)
MCH: 30.8 pg (ref 26.0–34.0)
MCHC: 33.3 g/dL (ref 30.0–36.0)
MCV: 92.3 fL (ref 80.0–100.0)
Platelets: 144 10*3/uL — ABNORMAL LOW (ref 150–400)
RBC: 4.39 MIL/uL (ref 4.22–5.81)
RDW: 12.9 % (ref 11.5–15.5)
WBC: 10.1 10*3/uL (ref 4.0–10.5)
nRBC: 0 % (ref 0.0–0.2)

## 2024-02-20 LAB — COMPREHENSIVE METABOLIC PANEL WITH GFR
ALT: 28 U/L (ref 0–44)
AST: 55 U/L — ABNORMAL HIGH (ref 15–41)
Albumin: 4 g/dL (ref 3.5–5.0)
Alkaline Phosphatase: 44 U/L (ref 38–126)
Anion gap: 9 (ref 5–15)
BUN: 11 mg/dL (ref 6–20)
CO2: 24 mmol/L (ref 22–32)
Calcium: 8.8 mg/dL — ABNORMAL LOW (ref 8.9–10.3)
Chloride: 110 mmol/L (ref 98–111)
Creatinine, Ser: 1.18 mg/dL (ref 0.61–1.24)
GFR, Estimated: >60 mL/min (ref >60)
Glucose, Bld: 86 mg/dL (ref 70–99)
Potassium: 3.4 mmol/L — ABNORMAL LOW (ref 3.5–5.1)
Sodium: 143 mmol/L (ref 135–145)
Total Bilirubin: 1 mg/dL (ref 0.0–1.2)
Total Protein: 6.6 g/dL (ref 6.5–8.1)

## 2024-02-20 LAB — RAPID URINE DRUG SCREEN, HOSP PERFORMED
Amphetamines: NOT DETECTED
Barbiturates: NOT DETECTED
Benzodiazepines: NOT DETECTED
Cocaine: POSITIVE — AB
Opiates: NOT DETECTED
Tetrahydrocannabinol: NOT DETECTED

## 2024-02-20 LAB — ETHANOL: Alcohol, Ethyl (B): <15 mg/dL (ref <15)

## 2024-02-20 MED ORDER — NALOXONE HCL 4 MG/0.1ML NA LIQD
NASAL | Status: AC
  Filled 2024-02-20: qty 4
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
## 2024-02-20 NOTE — ED Notes (Signed)
 Patient refusing to answer questions

## 2024-03-05 ENCOUNTER — Other Ambulatory Visit: Payer: Self-pay

## 2024-03-05 ENCOUNTER — Inpatient Hospital Stay (HOSPITAL_COMMUNITY): Payer: Self-pay

## 2024-03-05 ENCOUNTER — Emergency Department (HOSPITAL_COMMUNITY): Payer: Self-pay

## 2024-03-05 ENCOUNTER — Encounter (HOSPITAL_COMMUNITY): Payer: Self-pay

## 2024-03-05 ENCOUNTER — Inpatient Hospital Stay (HOSPITAL_COMMUNITY)
Admission: EM | Admit: 2024-03-05 | Discharge: 2024-03-22 | DRG: 958 | Disposition: A | Discharge status: Skilled Nursing Facility | Payer: Self-pay | Attending: Surgery | Admitting: Surgery

## 2024-03-05 DIAGNOSIS — I959 Hypotension, unspecified: Secondary | ICD-10-CM | POA: Diagnosis present

## 2024-03-05 DIAGNOSIS — N3289 Other specified disorders of bladder: Secondary | ICD-10-CM | POA: Diagnosis not present

## 2024-03-05 DIAGNOSIS — T1490XA Injury, unspecified, initial encounter: Principal | ICD-10-CM

## 2024-03-05 DIAGNOSIS — S0083XA Contusion of other part of head, initial encounter: Secondary | ICD-10-CM | POA: Diagnosis present

## 2024-03-05 DIAGNOSIS — S20311A Abrasion of right front wall of thorax, initial encounter: Secondary | ICD-10-CM | POA: Diagnosis present

## 2024-03-05 DIAGNOSIS — D62 Acute posthemorrhagic anemia: Secondary | ICD-10-CM | POA: Diagnosis not present

## 2024-03-05 DIAGNOSIS — S32811A Multiple fractures of pelvis with unstable disruption of pelvic ring, initial encounter for closed fracture: Secondary | ICD-10-CM | POA: Diagnosis present

## 2024-03-05 DIAGNOSIS — S82832A Other fracture of upper and lower end of left fibula, initial encounter for closed fracture: Secondary | ICD-10-CM | POA: Diagnosis present

## 2024-03-05 DIAGNOSIS — Y92410 Unspecified street and highway as the place of occurrence of the external cause: Secondary | ICD-10-CM

## 2024-03-05 DIAGNOSIS — S334XXA Traumatic rupture of symphysis pubis, initial encounter: Secondary | ICD-10-CM | POA: Diagnosis present

## 2024-03-05 DIAGNOSIS — Z91148 Patient's other noncompliance with medication regimen for other reason: Secondary | ICD-10-CM

## 2024-03-05 DIAGNOSIS — S3022XA Contusion of scrotum and testes, initial encounter: Secondary | ICD-10-CM | POA: Diagnosis present

## 2024-03-05 DIAGNOSIS — F17210 Nicotine dependence, cigarettes, uncomplicated: Secondary | ICD-10-CM | POA: Diagnosis present

## 2024-03-05 DIAGNOSIS — F251 Schizoaffective disorder, depressive type: Secondary | ICD-10-CM | POA: Diagnosis not present

## 2024-03-05 DIAGNOSIS — F1410 Cocaine abuse, uncomplicated: Secondary | ICD-10-CM | POA: Diagnosis present

## 2024-03-05 DIAGNOSIS — M7989 Other specified soft tissue disorders: Secondary | ICD-10-CM | POA: Diagnosis present

## 2024-03-05 DIAGNOSIS — S32431A Displaced fracture of anterior column [iliopubic] of right acetabulum, initial encounter for closed fracture: Secondary | ICD-10-CM

## 2024-03-05 DIAGNOSIS — F259 Schizoaffective disorder, unspecified: Secondary | ICD-10-CM | POA: Diagnosis present

## 2024-03-05 DIAGNOSIS — S27321A Contusion of lung, unilateral, initial encounter: Secondary | ICD-10-CM | POA: Diagnosis present

## 2024-03-05 DIAGNOSIS — R609 Edema, unspecified: Secondary | ICD-10-CM | POA: Diagnosis not present

## 2024-03-05 DIAGNOSIS — S32411A Displaced fracture of anterior wall of right acetabulum, initial encounter for closed fracture: Secondary | ICD-10-CM | POA: Diagnosis present

## 2024-03-05 DIAGNOSIS — F1510 Other stimulant abuse, uncomplicated: Secondary | ICD-10-CM | POA: Diagnosis present

## 2024-03-05 DIAGNOSIS — Z23 Encounter for immunization: Secondary | ICD-10-CM | POA: Diagnosis present

## 2024-03-05 DIAGNOSIS — Z79899 Other long term (current) drug therapy: Secondary | ICD-10-CM | POA: Diagnosis not present

## 2024-03-05 DIAGNOSIS — S329XXA Fracture of unspecified parts of lumbosacral spine and pelvis, initial encounter for closed fracture: Secondary | ICD-10-CM | POA: Diagnosis present

## 2024-03-05 DIAGNOSIS — S32431S Displaced fracture of anterior column [iliopubic] of right acetabulum, sequela: Secondary | ICD-10-CM | POA: Diagnosis not present

## 2024-03-05 DIAGNOSIS — N5089 Other specified disorders of the male genital organs: Secondary | ICD-10-CM | POA: Diagnosis present

## 2024-03-05 DIAGNOSIS — Z885 Allergy status to narcotic agent status: Secondary | ICD-10-CM

## 2024-03-05 DIAGNOSIS — S344XXA Injury of lumbosacral plexus, initial encounter: Secondary | ICD-10-CM | POA: Diagnosis present

## 2024-03-05 DIAGNOSIS — E872 Acidosis, unspecified: Secondary | ICD-10-CM | POA: Diagnosis present

## 2024-03-05 DIAGNOSIS — R102 Pelvic and perineal pain: Secondary | ICD-10-CM | POA: Diagnosis not present

## 2024-03-05 DIAGNOSIS — S3729XA Other injury of bladder, initial encounter: Secondary | ICD-10-CM | POA: Diagnosis present

## 2024-03-05 DIAGNOSIS — R202 Paresthesia of skin: Secondary | ICD-10-CM | POA: Diagnosis not present

## 2024-03-05 DIAGNOSIS — R45851 Suicidal ideations: Secondary | ICD-10-CM | POA: Diagnosis present

## 2024-03-05 DIAGNOSIS — F1210 Cannabis abuse, uncomplicated: Secondary | ICD-10-CM | POA: Diagnosis present

## 2024-03-05 DIAGNOSIS — R000 Tachycardia, unspecified: Secondary | ICD-10-CM | POA: Diagnosis not present

## 2024-03-05 DIAGNOSIS — S32401A Unspecified fracture of right acetabulum, initial encounter for closed fracture: Secondary | ICD-10-CM | POA: Diagnosis present

## 2024-03-05 HISTORY — DX: Other psychoactive substance abuse, uncomplicated: F19.10

## 2024-03-05 LAB — PREPARE FRESH FROZEN PLASMA
Unit division: 0
Unit division: 0
Unit division: 0
Unit division: 0
Unit division: 0

## 2024-03-05 LAB — COMPREHENSIVE METABOLIC PANEL WITH GFR
ALT: 28 U/L (ref 0–44)
AST: 63 U/L — ABNORMAL HIGH (ref 15–41)
Albumin: 3.6 g/dL (ref 3.5–5.0)
Alkaline Phosphatase: 48 U/L (ref 38–126)
Anion gap: 17 — ABNORMAL HIGH (ref 5–15)
BUN: 19 mg/dL (ref 6–20)
CO2: 16 mmol/L — ABNORMAL LOW (ref 22–32)
Calcium: 8.7 mg/dL — ABNORMAL LOW (ref 8.9–10.3)
Chloride: 108 mmol/L (ref 98–111)
Creatinine, Ser: 1.61 mg/dL — ABNORMAL HIGH (ref 0.61–1.24)
GFR, Estimated: 59 mL/min — ABNORMAL LOW (ref >60)
Glucose, Bld: 175 mg/dL — ABNORMAL HIGH (ref 70–99)
Potassium: 4.4 mmol/L (ref 3.5–5.1)
Sodium: 141 mmol/L (ref 135–145)
Total Bilirubin: 0.5 mg/dL (ref 0.0–1.2)
Total Protein: 6.5 g/dL (ref 6.5–8.1)

## 2024-03-05 LAB — BPAM PLATELET PHERESIS
Blood Product Expiration Date: 202505242359
ISSUE DATE / TIME: 202505231609
Unit Type and Rh: 5100

## 2024-03-05 LAB — PREPARE PLATELET PHERESIS: Unit division: 0

## 2024-03-05 LAB — PROTIME-INR
INR: 1.1 (ref 0.8–1.2)
Prothrombin Time: 14 s (ref 11.4–15.2)

## 2024-03-05 LAB — BPAM FFP
Blood Product Expiration Date: 202505272359
Blood Product Expiration Date: 202505272359
Blood Product Expiration Date: 202505272359
Blood Product Expiration Date: 202505272359
Blood Product Expiration Date: 202505272359
Blood Product Expiration Date: 202505272359
Blood Product Expiration Date: 202505272359
Blood Product Expiration Date: 202505272359
Blood Product Expiration Date: 202505272359
Blood Product Expiration Date: 202506112359
Blood Product Expiration Date: 202506112359
Blood Product Expiration Date: 202506112359
ISSUE DATE / TIME: 202505231600
ISSUE DATE / TIME: 202505231600
ISSUE DATE / TIME: 202505231600
ISSUE DATE / TIME: 202505231600
ISSUE DATE / TIME: 202505231600
ISSUE DATE / TIME: 202505231600
ISSUE DATE / TIME: 202505231600
ISSUE DATE / TIME: 202505231600
ISSUE DATE / TIME: 202505231621
ISSUE DATE / TIME: 202505231621
ISSUE DATE / TIME: 202505231621
ISSUE DATE / TIME: 202505231621
Unit Type and Rh: 600
Unit Type and Rh: 6200
Unit Type and Rh: 6200
Unit Type and Rh: 6200
Unit Type and Rh: 6200
Unit Type and Rh: 6200
Unit Type and Rh: 6200
Unit Type and Rh: 6200
Unit Type and Rh: 6200
Unit Type and Rh: 6200
Unit Type and Rh: 6200
Unit Type and Rh: 6200

## 2024-03-05 LAB — BPAM CRYOPRECIPITATE
Blood Product Expiration Date: 202505272359
ISSUE DATE / TIME: 202505231625
Unit Type and Rh: 6200

## 2024-03-05 LAB — I-STAT CHEM 8, ED
BUN: 24 mg/dL — ABNORMAL HIGH (ref 6–20)
Calcium, Ion: 1.08 mmol/L — ABNORMAL LOW (ref 1.15–1.40)
Chloride: 109 mmol/L (ref 98–111)
Creatinine, Ser: 1.6 mg/dL — ABNORMAL HIGH (ref 0.61–1.24)
Glucose, Bld: 172 mg/dL — ABNORMAL HIGH (ref 70–99)
HCT: 39 % (ref 39.0–52.0)
Hemoglobin: 13.3 g/dL (ref 13.0–17.0)
Potassium: 4.4 mmol/L (ref 3.5–5.1)
Sodium: 142 mmol/L (ref 135–145)
TCO2: 17 mmol/L — ABNORMAL LOW (ref 22–32)

## 2024-03-05 LAB — PREPARE CRYOPRECIPITATE: Unit division: 0

## 2024-03-05 LAB — MASSIVE TRANSFUSION PROTOCOL ORDER (BLOOD BANK NOTIFICATION)

## 2024-03-05 LAB — I-STAT CG4 LACTIC ACID, ED: Lactic Acid, Venous: 9.3 mmol/L (ref 0.5–1.9)

## 2024-03-05 LAB — CBC
HCT: 40.2 % (ref 39.0–52.0)
Hemoglobin: 12.6 g/dL — ABNORMAL LOW (ref 13.0–17.0)
MCH: 30.8 pg (ref 26.0–34.0)
MCHC: 31.3 g/dL (ref 30.0–36.0)
MCV: 98.3 fL (ref 80.0–100.0)
Platelets: 311 10*3/uL (ref 150–400)
RBC: 4.09 MIL/uL — ABNORMAL LOW (ref 4.22–5.81)
RDW: 13.5 % (ref 11.5–15.5)
WBC: 8.6 10*3/uL (ref 4.0–10.5)
nRBC: 0 % (ref 0.0–0.2)

## 2024-03-05 LAB — MRSA NEXT GEN BY PCR, NASAL: MRSA by PCR Next Gen: NOT DETECTED

## 2024-03-05 LAB — HEMOGLOBIN AND HEMATOCRIT, BLOOD
HCT: 44.2 % (ref 39.0–52.0)
Hemoglobin: 14.6 g/dL (ref 13.0–17.0)

## 2024-03-05 LAB — ETHANOL: Alcohol, Ethyl (B): <15 mg/dL (ref <15)

## 2024-03-05 LAB — HIV ANTIBODY (ROUTINE TESTING W REFLEX): HIV Screen 4th Generation wRfx: NONREACTIVE

## 2024-03-05 LAB — ABO/RH: ABO/RH(D): A POS

## 2024-03-05 MED ORDER — POLYETHYLENE GLYCOL 3350 17 G PO PACK: 17.0000 g | PACK | Freq: Every day | ORAL | Status: DC | PRN

## 2024-03-05 MED ORDER — HALOPERIDOL LACTATE 5 MG/ML IJ SOLN
10.0000 mg | Freq: Four times a day (QID) | INTRAMUSCULAR | Status: DC | PRN
  Administered 2024-03-12 – 2024-03-17 (×7): 10 mg via INTRAVENOUS
  Filled 2024-03-05 (×7): qty 2

## 2024-03-05 MED ORDER — DEXMEDETOMIDINE HCL IN NACL 400 MCG/100ML IV SOLN
0.0000 ug/kg/h | INTRAVENOUS | Status: DC
  Administered 2024-03-05: 0.4 ug/kg/h via INTRAVENOUS
  Administered 2024-03-06: 0.3 ug/kg/h via INTRAVENOUS
  Filled 2024-03-05 (×2): qty 100

## 2024-03-05 MED ORDER — OXYCODONE HCL 5 MG PO TABS
5.0000 mg | ORAL_TABLET | ORAL | Status: DC | PRN
  Administered 2024-03-08 (×3): 10 mg via ORAL
  Administered 2024-03-08: 5 mg via ORAL
  Administered 2024-03-09 (×2): 10 mg via ORAL
  Administered 2024-03-09: 5 mg via ORAL
  Administered 2024-03-10 (×4): 10 mg via ORAL
  Administered 2024-03-11: 5 mg via ORAL
  Administered 2024-03-11 – 2024-03-22 (×36): 10 mg via ORAL
  Filled 2024-03-05 (×14): qty 2
  Filled 2024-03-05: qty 1
  Filled 2024-03-05: qty 2
  Filled 2024-03-05: qty 1
  Filled 2024-03-05 (×2): qty 2
  Filled 2024-03-05: qty 1
  Filled 2024-03-05 (×29): qty 2

## 2024-03-05 MED ORDER — CHLORHEXIDINE GLUCONATE CLOTH 2 % EX PADS: 6.0000 | MEDICATED_PAD | Freq: Every day | CUTANEOUS | Status: DC

## 2024-03-05 MED ORDER — ONDANSETRON 4 MG PO TBDP: 4.0000 mg | ORAL_TABLET | Freq: Four times a day (QID) | ORAL | Status: DC | PRN

## 2024-03-05 MED ORDER — CEFAZOLIN SODIUM-DEXTROSE 2-4 GM/100ML-% IV SOLN
2.0000 g | Freq: Once | INTRAVENOUS | Status: AC
  Administered 2024-03-05: 2 g via INTRAVENOUS

## 2024-03-05 MED ORDER — LIDOCAINE HCL (PF) 1 % IJ SOLN
INTRAMUSCULAR | Status: AC
  Filled 2024-03-05: qty 5

## 2024-03-05 MED ORDER — SODIUM CHLORIDE 0.9 % IV SOLN
INTRAVENOUS | Status: AC | PRN
  Administered 2024-03-05: 1000 mL via INTRAVENOUS

## 2024-03-05 MED ORDER — METHOCARBAMOL 500 MG PO TABS
1000.0000 mg | ORAL_TABLET | Freq: Three times a day (TID) | ORAL | Status: DC
  Administered 2024-03-06 – 2024-03-09 (×6): 1000 mg via ORAL
  Filled 2024-03-05 (×8): qty 2

## 2024-03-05 MED ORDER — IOHEXOL 350 MG/ML SOLN
75.0000 mL | Freq: Once | INTRAVENOUS | Status: AC | PRN
  Administered 2024-03-05: 75 mL via INTRAVENOUS

## 2024-03-05 MED ORDER — SODIUM CHLORIDE 0.9% IV SOLUTION: Freq: Once | INTRAVENOUS | Status: DC

## 2024-03-05 MED ORDER — MORPHINE SULFATE (PF) 4 MG/ML IV SOLN
4.0000 mg | INTRAVENOUS | Status: DC | PRN
  Filled 2024-03-05 (×2): qty 1

## 2024-03-05 MED ORDER — MIDAZOLAM HCL 2 MG/2ML IJ SOLN
2.0000 mg | Freq: Once | INTRAMUSCULAR | Status: AC
  Administered 2024-03-05: 2 mg via INTRAVENOUS

## 2024-03-05 MED ORDER — LIDOCAINE HCL 0.5 % IJ SOLN
10.0000 mL | Freq: Once | INTRAMUSCULAR | Status: DC
  Filled 2024-03-05: qty 10

## 2024-03-05 MED ORDER — TRANEXAMIC ACID 1000 MG/10ML IV SOLN
1000.0000 mg | Freq: Once | INTRAVENOUS | Status: AC
  Administered 2024-03-05: 1000 mg via INTRAVENOUS
  Filled 2024-03-05: qty 10

## 2024-03-05 MED ORDER — CHLORHEXIDINE GLUCONATE CLOTH 2 % EX PADS
6.0000 | MEDICATED_PAD | Freq: Every day | CUTANEOUS | Status: DC
  Administered 2024-03-06 – 2024-03-20 (×14): 6 via TOPICAL

## 2024-03-05 MED ORDER — METOPROLOL TARTRATE 5 MG/5ML IV SOLN: 5.0000 mg | Freq: Four times a day (QID) | INTRAVENOUS | Status: DC | PRN

## 2024-03-05 MED ORDER — CALCIUM GLUCONATE-NACL 2-0.675 GM/100ML-% IV SOLN
2.0000 g | Freq: Once | INTRAVENOUS | Status: AC
  Administered 2024-03-05: 2000 mg via INTRAVENOUS
  Filled 2024-03-05: qty 100

## 2024-03-05 MED ORDER — ONDANSETRON HCL 4 MG/2ML IJ SOLN: 4.0000 mg | Freq: Four times a day (QID) | INTRAMUSCULAR | Status: DC | PRN

## 2024-03-05 MED ORDER — TETANUS-DIPHTH-ACELL PERTUSSIS 5-2.5-18.5 LF-MCG/0.5 IM SUSY
0.5000 mL | PREFILLED_SYRINGE | Freq: Once | INTRAMUSCULAR | Status: AC
  Administered 2024-03-05: 0.5 mL via INTRAMUSCULAR

## 2024-03-05 MED ORDER — HYDRALAZINE HCL 20 MG/ML IJ SOLN: 10.0000 mg | INTRAMUSCULAR | Status: DC | PRN

## 2024-03-05 MED ORDER — DOCUSATE SODIUM 100 MG PO CAPS
100.0000 mg | ORAL_CAPSULE | Freq: Two times a day (BID) | ORAL | Status: DC
  Administered 2024-03-07 – 2024-03-22 (×26): 100 mg via ORAL
  Filled 2024-03-05 (×28): qty 1

## 2024-03-05 MED ORDER — ACETAMINOPHEN 500 MG PO TABS
1000.0000 mg | ORAL_TABLET | Freq: Four times a day (QID) | ORAL | Status: DC
  Administered 2024-03-06 – 2024-03-22 (×48): 1000 mg via ORAL
  Filled 2024-03-05 (×53): qty 2

## 2024-03-05 MED ORDER — TRANEXAMIC ACID-NACL 1000-0.7 MG/100ML-% IV SOLN
1000.0000 mg | Freq: Once | INTRAVENOUS | Status: AC
  Administered 2024-03-05: 1000 mg via INTRAVENOUS

## 2024-03-05 MED ORDER — HALOPERIDOL LACTATE 5 MG/ML IJ SOLN
10.0000 mg | Freq: Four times a day (QID) | INTRAMUSCULAR | Status: DC | PRN
  Filled 2024-03-05: qty 2

## 2024-03-05 MED ORDER — HALOPERIDOL LACTATE 5 MG/ML IJ SOLN
10.0000 mg | Freq: Once | INTRAMUSCULAR | Status: AC
  Administered 2024-03-05: 10 mg via INTRAVENOUS

## 2024-03-05 MED ORDER — MIDAZOLAM HCL 2 MG/2ML IJ SOLN
INTRAMUSCULAR | Status: AC
Start: 2024-03-05 — End: ?
  Filled 2024-03-05: qty 2
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
## 2024-03-05 NOTE — ED Notes (Signed)
 X-ray at bedside

## 2024-03-05 NOTE — Progress Notes (Signed)
 Orthopedic Tech Progress Note Patient Details:  [name] 06-02-1996 [id number]  Musculoskeletal Traction Type of Traction: Bucks Skin Traction Traction Location: RLE Traction Weight: 10 lbs Pt. Could not/would not consent to skeletal traction. Bucks traction placed per Dr. Sulema Endo.   Post Interventions Patient Tolerated: Well  [name] 03/05/2024, 6:54 PM

## 2024-03-05 NOTE — Progress Notes (Addendum)
 1730:  Patient transferred from ED to 4NICU with TRN at bedside.  Pelvic binder on.  Unable to assess back at this time.  Blood pressure stable at this time.  Patient alert to self.  Attempted to call family (number given from TRN) but no answer.    No belongings noted with transfer.
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
## 2024-03-05 NOTE — Progress Notes (Addendum)
   03/05/24 2100  Neurological  Neuro (WDL) X  Level of Consciousness Responds to Pain  Orientation Level Disoriented X4 (pt not speaking)  Cognition Unable to follow commands  [employer];Incomprehensible  R Pupil Size (mm) 3  R Pupil Shape Round  R Pupil Reaction Brisk  L Pupil Size (mm) 3  L Pupil Shape Round  L Pupil Reaction Brisk   Pt more alert and asking for water  around 2000. At 2100, patient only responding with grimace and/or grunting to noxious stimuli. Dr. Aniceto Barley made aware. Per Dr. Aniceto Barley okay to hold oral meds tonight as patient is too lethargic to swallow, and okay with patient neuro exam as long as VS remain stable.   2235- Dr. Aniceto Barley at bedside. Patient opened eyes to sternal rub. Says "[name]" when asked his name. Also clarified with Dr. Aniceto Barley- okay to logroll.   0430- Patient's scrotum appearing to be more edematous/weeping with a new blister. TRN notified and new pictures added to chart. TRN notified Dr. Aniceto Barley.

## 2024-03-05 NOTE — ED Notes (Signed)
 Patient transported to CT with providers, this RN & TRN
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
## 2024-03-05 NOTE — Progress Notes (Signed)
 Pt refused skeletal traction for Dr. Sulema Endo- he is sleepy and does not have any other family that we are able to get in touch with. Bucks traction placed per ortho techs.
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
## 2024-03-06 DIAGNOSIS — S32431A Displaced fracture of anterior column [iliopubic] of right acetabulum, initial encounter for closed fracture: Secondary | ICD-10-CM | POA: Diagnosis not present

## 2024-03-06 DIAGNOSIS — F259 Schizoaffective disorder, unspecified: Secondary | ICD-10-CM | POA: Diagnosis present

## 2024-03-06 LAB — CBC
HCT: 41.6 % (ref 39.0–52.0)
Hemoglobin: 13.8 g/dL (ref 13.0–17.0)
MCH: 31 pg (ref 26.0–34.0)
MCHC: 33.2 g/dL (ref 30.0–36.0)
MCV: 93.5 fL (ref 80.0–100.0)
Platelets: 195 10*3/uL (ref 150–400)
RBC: 4.45 MIL/uL (ref 4.22–5.81)
RDW: 14.3 % (ref 11.5–15.5)
WBC: 7.9 10*3/uL (ref 4.0–10.5)
nRBC: 0 % (ref 0.0–0.2)

## 2024-03-06 LAB — BASIC METABOLIC PANEL WITH GFR
Anion gap: 14 (ref 5–15)
BUN: 21 mg/dL — ABNORMAL HIGH (ref 6–20)
CO2: 19 mmol/L — ABNORMAL LOW (ref 22–32)
Calcium: 8.8 mg/dL — ABNORMAL LOW (ref 8.9–10.3)
Chloride: 108 mmol/L (ref 98–111)
Creatinine, Ser: 1.22 mg/dL (ref 0.61–1.24)
GFR, Estimated: >60 mL/min (ref >60)
Glucose, Bld: 104 mg/dL — ABNORMAL HIGH (ref 70–99)
Potassium: 4.4 mmol/L (ref 3.5–5.1)
Sodium: 141 mmol/L (ref 135–145)

## 2024-03-06 LAB — DIC (DISSEMINATED INTRAVASCULAR COAGULATION)PANEL
D-Dimer, Quant: >20 ug{FEU}/mL — ABNORMAL HIGH (ref 0.00–0.50)
Fibrinogen: 373 mg/dL (ref 210–475)
INR: 1.1 (ref 0.8–1.2)
Platelets: 211 10*3/uL (ref 150–400)
Prothrombin Time: 14.3 s (ref 11.4–15.2)
Smear Review: NONE SEEN
aPTT: 25 s (ref 24–36)

## 2024-03-06 LAB — RAPID URINE DRUG SCREEN, HOSP PERFORMED
Amphetamines: NOT DETECTED
Barbiturates: NOT DETECTED
Benzodiazepines: POSITIVE — AB
Cocaine: POSITIVE — AB
Opiates: NOT DETECTED
Tetrahydrocannabinol: NOT DETECTED

## 2024-03-06 NOTE — Progress Notes (Signed)
 Orthopedic Tech Progress Note Patient Details:  [name] 1995-12-21 [id number]  Pt was converted from bucks traction to skeletal traction by Dr. Sulema Endo.   Musculoskeletal Traction Type of Traction: Skeletal (Balanced Suspension) Traction Location: RLE Traction Weight: 10 lbs   Post Interventions Patient Tolerated: Well Instructions Provided: Care of device  Elizabeht Suto Crystal Dory 03/06/2024, 1:33 PM

## 2024-03-06 NOTE — Progress Notes (Signed)
 Consult request received for pelvic ring and right T type acetabular fracture. Have discussed with the requesting MD patient's stability, pain control, and ongoing work up. I have reviewed x-rays and developed a provisional plan.   Full consultation to follow in the am and will assume care from Dr. Sulema Endo. Surgery likely Monday or Tue am, but will make NPO just in case can be moved up to tomorrow.   Hardy Lia, MD [facility], [facility] [phone]
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
## 2024-03-06 NOTE — Progress Notes (Signed)
 Orthopedic Progress Note   Saw patient this morning.  He was sleeping but awoke to voice. Went over traction pin recommendation given his acetabulum fracture and undetermined timing for fixation. Covered the risks, benefits, and alternatives of this proposed procedure. Afterwards, he elected to proceed. See procedure note below,  No acute events overnight. Has remained hemodynamically stable. On exam, EHL/TA/GSC intact, SILT in s/s/dp/sp/t nerve distributions, palpable DP pulses, feet warm and well perfused. Henri Loft is in place and is well fitting.  Plan: -Will discuss definitive fixation with my traumatology colleagues -NWB RLE in traction -Okay for diet and dvt ppx from ortho perspective -Keep binder on for now, it appears well fitting and he is not having any pain around it even without getting any pain medication. He was hemodynamically unstable prior to placement -Dispo: pending completion of operative fixation   Traction pin placement note:  After discussing the risk, benefits, alternatives of right femur traction pin placement for inline skeletal traction, patient elected to proceed.  The distal femur was prepped with ChloraPrep.  The knee was draped out with sterile blue towels.  10 cc of lidocaine  was injected over the medial aspect of the femur all the way to the periosteum.  The same process was repeated on the lateral side with 10 cc of lidocaine  on that side.  A stab incision was made with an 11 blade over the medial aspect of the distal femur.  A traction pin was placed along the medial aspect of the femur.  It was then placed through the distal femur bicortically.  It was advanced until it was seen tenting the skin on the lateral aspect of the distal femur.  An 11 blade was used to make a stab incision over the lateral aspect of the thigh where the skin was tenting.  The pin was then advanced through that stab incision.  The pin was advanced until it was equal length on each side of the  thigh.  A traction bow was then attached to the pin.  Weights were hung from the edge of the bed and attached to the bow.  Patient tolerated the procedure well.   Diedra Fowler, MD Orthopedic Surgeon

## 2024-03-06 NOTE — Consult Note (Signed)
 Psych went to see the client, no able to arouse him.  His RN stated he will awaken sporadically with some restlessness and pulling at his IV but no agitation or aggression towards the staff.  PRN Haldol  is in place as needed.  Psych will continue to follow with the ultimate goal to have him return to his psychiatric medications when more medically stable.  Caveat:  He is typically positive for cocaine and/or amphetamines.  EKG on 5/24 of 431 QTC, UDS ordered.  Roslynn Coombes, PMHNP
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
## 2024-03-07 ENCOUNTER — Inpatient Hospital Stay (HOSPITAL_COMMUNITY): Payer: Self-pay | Admitting: Anesthesiology

## 2024-03-07 ENCOUNTER — Inpatient Hospital Stay (HOSPITAL_COMMUNITY): Payer: Self-pay

## 2024-03-07 ENCOUNTER — Other Ambulatory Visit: Payer: Self-pay

## 2024-03-07 ENCOUNTER — Encounter (HOSPITAL_COMMUNITY): Payer: Self-pay

## 2024-03-07 ENCOUNTER — Encounter (HOSPITAL_COMMUNITY): Admission: EM | Disposition: A | Discharge status: Skilled Nursing Facility | Payer: Self-pay | Source: Home / Self Care

## 2024-03-07 DIAGNOSIS — N3289 Other specified disorders of bladder: Secondary | ICD-10-CM | POA: Diagnosis not present

## 2024-03-07 DIAGNOSIS — S3022XA Contusion of scrotum and testes, initial encounter: Secondary | ICD-10-CM

## 2024-03-07 DIAGNOSIS — S329XXA Fracture of unspecified parts of lumbosacral spine and pelvis, initial encounter for closed fracture: Secondary | ICD-10-CM | POA: Diagnosis not present

## 2024-03-07 HISTORY — PX: ORIF ACETABULAR FRACTURE: SHX5029

## 2024-03-07 HISTORY — PX: BLADDER REPAIR: SHX6721

## 2024-03-07 HISTORY — PX: ORIF PELVIC FRACTURE: SHX2128

## 2024-03-07 LAB — POCT I-STAT EG7
Acid-base deficit: 1 mmol/L (ref 0.0–2.0)
Bicarbonate: 25.2 mmol/L (ref 20.0–28.0)
Calcium, Ion: 1.11 mmol/L — ABNORMAL LOW (ref 1.15–1.40)
HCT: 27 % — ABNORMAL LOW (ref 39.0–52.0)
Hemoglobin: 9.2 g/dL — ABNORMAL LOW (ref 13.0–17.0)
O2 Saturation: 100 %
Patient temperature: 35.4
Potassium: 4.7 mmol/L (ref 3.5–5.1)
Sodium: 133 mmol/L — ABNORMAL LOW (ref 135–145)
TCO2: 27 mmol/L (ref 22–32)
pCO2, Ven: 43 mmHg — ABNORMAL LOW (ref 44–60)
pH, Ven: 7.369 (ref 7.25–7.43)
pO2, Ven: 179 mmHg — ABNORMAL HIGH (ref 32–45)

## 2024-03-07 LAB — POCT I-STAT 7, (LYTES, BLD GAS, ICA,H+H)
Acid-base deficit: 2 mmol/L (ref 0.0–2.0)
Bicarbonate: 24.1 mmol/L (ref 20.0–28.0)
Calcium, Ion: 1.07 mmol/L — ABNORMAL LOW (ref 1.15–1.40)
HCT: 27 % — ABNORMAL LOW (ref 39.0–52.0)
Hemoglobin: 9.2 g/dL — ABNORMAL LOW (ref 13.0–17.0)
O2 Saturation: 86 %
Patient temperature: 35.2
Potassium: 5.1 mmol/L (ref 3.5–5.1)
Sodium: 134 mmol/L — ABNORMAL LOW (ref 135–145)
TCO2: 26 mmol/L (ref 22–32)
pCO2 arterial: 44.2 mmHg (ref 32–48)
pH, Arterial: 7.336 — ABNORMAL LOW (ref 7.35–7.45)
pO2, Arterial: 51 mmHg — ABNORMAL LOW (ref 83–108)

## 2024-03-07 LAB — PREPARE RBC (CROSSMATCH)

## 2024-03-07 SURGERY — OPEN REDUCTION INTERNAL FIXATION (ORIF) ACETABULAR FRACTURE
Anesthesia: General | Site: Shoulder | Laterality: Right

## 2024-03-07 MED ORDER — HYDROMORPHONE HCL 1 MG/ML IJ SOLN
INTRAMUSCULAR | Status: AC
  Filled 2024-03-07: qty 0.5

## 2024-03-07 MED ORDER — LIDOCAINE 2% (20 MG/ML) 5 ML SYRINGE
INTRAMUSCULAR | Status: AC
  Filled 2024-03-07: qty 5

## 2024-03-07 MED ORDER — HYDRALAZINE HCL 20 MG/ML IJ SOLN
10.0000 mg | Freq: Once | INTRAMUSCULAR | Status: AC
  Administered 2024-03-07: 10 mg via INTRAVENOUS

## 2024-03-07 MED ORDER — TRANEXAMIC ACID-NACL 1000-0.7 MG/100ML-% IV SOLN
1000.0000 mg | INTRAVENOUS | Status: AC
  Administered 2024-03-07: 1000 mg via INTRAVENOUS

## 2024-03-07 MED ORDER — 0.9 % SODIUM CHLORIDE (POUR BTL) OPTIME
TOPICAL | Status: DC | PRN
  Administered 2024-03-07: 1000 mL

## 2024-03-07 MED ORDER — MIDAZOLAM HCL 2 MG/2ML IJ SOLN
INTRAMUSCULAR | Status: AC
  Filled 2024-03-07: qty 2

## 2024-03-07 MED ORDER — PROPOFOL 10 MG/ML IV BOLUS
INTRAVENOUS | Status: DC | PRN
  Administered 2024-03-07: 120 mg via INTRAVENOUS

## 2024-03-07 MED ORDER — ENOXAPARIN SODIUM 30 MG/0.3ML IJ SOSY
30.0000 mg | PREFILLED_SYRINGE | Freq: Two times a day (BID) | INTRAMUSCULAR | Status: DC
  Administered 2024-03-07: 30 mg via SUBCUTANEOUS
  Filled 2024-03-07 (×9): qty 0.3

## 2024-03-07 MED ORDER — ONDANSETRON HCL 4 MG/2ML IJ SOLN: 4.0000 mg | Freq: Once | INTRAMUSCULAR | Status: DC | PRN

## 2024-03-07 MED ORDER — HYDROMORPHONE HCL 1 MG/ML IJ SOLN
INTRAMUSCULAR | Status: DC | PRN
  Administered 2024-03-07 (×2): .5 mg via INTRAVENOUS

## 2024-03-07 MED ORDER — ACETAMINOPHEN 10 MG/ML IV SOLN
INTRAVENOUS | Status: DC | PRN
  Administered 2024-03-07: 1000 mg via INTRAVENOUS

## 2024-03-07 MED ORDER — LIDOCAINE 2% (20 MG/ML) 5 ML SYRINGE
INTRAMUSCULAR | Status: DC | PRN
  Administered 2024-03-07: 100 mg via INTRAVENOUS

## 2024-03-07 MED ORDER — CEFAZOLIN SODIUM-DEXTROSE 2-4 GM/100ML-% IV SOLN
INTRAVENOUS | Status: AC
  Filled 2024-03-07: qty 100

## 2024-03-07 MED ORDER — FENTANYL CITRATE (PF) 100 MCG/2ML IJ SOLN
25.0000 ug | INTRAMUSCULAR | Status: DC | PRN
  Administered 2024-03-07: 50 ug via INTRAVENOUS

## 2024-03-07 MED ORDER — CHLORHEXIDINE GLUCONATE 0.12 % MT SOLN
OROMUCOSAL | Status: AC
  Filled 2024-03-07: qty 15

## 2024-03-07 MED ORDER — ONDANSETRON HCL 4 MG/2ML IJ SOLN
INTRAMUSCULAR | Status: DC | PRN
  Administered 2024-03-07: 4 mg via INTRAVENOUS

## 2024-03-07 MED ORDER — MIDAZOLAM HCL 2 MG/2ML IJ SOLN
INTRAMUSCULAR | Status: DC | PRN
  Administered 2024-03-07: 2 mg via INTRAVENOUS

## 2024-03-07 MED ORDER — SODIUM CHLORIDE 0.9% IV SOLUTION: Freq: Once | INTRAVENOUS | Status: DC

## 2024-03-07 MED ORDER — LACTATED RINGERS IV SOLN: INTRAVENOUS | Status: DC

## 2024-03-07 MED ORDER — VANCOMYCIN HCL 1000 MG IV SOLR
INTRAVENOUS | Status: DC | PRN
  Administered 2024-03-07: 1000 mg via TOPICAL

## 2024-03-07 MED ORDER — DEXMEDETOMIDINE HCL IN NACL 80 MCG/20ML IV SOLN
INTRAVENOUS | Status: DC | PRN
  Administered 2024-03-07 (×2): 4 ug via INTRAVENOUS

## 2024-03-07 MED ORDER — TRANEXAMIC ACID-NACL 1000-0.7 MG/100ML-% IV SOLN
INTRAVENOUS | Status: AC
  Filled 2024-03-07: qty 100

## 2024-03-07 MED ORDER — ACETAMINOPHEN 10 MG/ML IV SOLN
INTRAVENOUS | Status: AC
  Filled 2024-03-07: qty 100

## 2024-03-07 MED ORDER — ROCURONIUM BROMIDE 10 MG/ML (PF) SYRINGE
PREFILLED_SYRINGE | INTRAVENOUS | Status: DC | PRN
  Administered 2024-03-07: 10 mg via INTRAVENOUS
  Administered 2024-03-07: 60 mg via INTRAVENOUS
  Administered 2024-03-07: 40 mg via INTRAVENOUS
  Administered 2024-03-07 (×2): 30 mg via INTRAVENOUS

## 2024-03-07 MED ORDER — ONDANSETRON HCL 4 MG/2ML IJ SOLN
INTRAMUSCULAR | Status: AC
  Filled 2024-03-07: qty 2

## 2024-03-07 MED ORDER — FENTANYL CITRATE (PF) 100 MCG/2ML IJ SOLN
INTRAMUSCULAR | Status: AC
  Filled 2024-03-07: qty 2

## 2024-03-07 MED ORDER — PHENYLEPHRINE 80 MCG/ML (10ML) SYRINGE FOR IV PUSH (FOR BLOOD PRESSURE SUPPORT)
PREFILLED_SYRINGE | INTRAVENOUS | Status: AC
  Filled 2024-03-07: qty 10

## 2024-03-07 MED ORDER — ROCURONIUM BROMIDE 10 MG/ML (PF) SYRINGE
PREFILLED_SYRINGE | INTRAVENOUS | Status: AC
  Filled 2024-03-07: qty 10

## 2024-03-07 MED ORDER — CEFAZOLIN SODIUM-DEXTROSE 2-4 GM/100ML-% IV SOLN
2.0000 g | INTRAVENOUS | Status: AC
  Administered 2024-03-07: 2 g via INTRAVENOUS

## 2024-03-07 MED ORDER — AMISULPRIDE (ANTIEMETIC) 5 MG/2ML IV SOLN: 10.0000 mg | Freq: Once | INTRAVENOUS | Status: DC | PRN

## 2024-03-07 MED ORDER — FENTANYL CITRATE (PF) 250 MCG/5ML IJ SOLN
INTRAMUSCULAR | Status: DC | PRN
  Administered 2024-03-07: 100 ug via INTRAVENOUS
  Administered 2024-03-07 (×3): 50 ug via INTRAVENOUS

## 2024-03-07 MED ORDER — LACTATED RINGERS IV BOLUS
1000.0000 mL | Freq: Once | INTRAVENOUS | Status: AC
  Administered 2024-03-07: 1000 mL via INTRAVENOUS

## 2024-03-07 MED ORDER — HYDRALAZINE HCL 20 MG/ML IJ SOLN
INTRAMUSCULAR | Status: AC
  Filled 2024-03-07: qty 1

## 2024-03-07 MED ORDER — VANCOMYCIN HCL 1000 MG IV SOLR
INTRAVENOUS | Status: AC
  Filled 2024-03-07: qty 20

## 2024-03-07 MED ORDER — DEXAMETHASONE SODIUM PHOSPHATE 10 MG/ML IJ SOLN
INTRAMUSCULAR | Status: AC
  Filled 2024-03-07: qty 1

## 2024-03-07 MED ORDER — SUGAMMADEX SODIUM 200 MG/2ML IV SOLN
INTRAVENOUS | Status: DC | PRN
  Administered 2024-03-07: 200 mg via INTRAVENOUS

## 2024-03-07 MED ORDER — ORAL CARE MOUTH RINSE: 15.0000 mL | Freq: Once | OROMUCOSAL | Status: AC

## 2024-03-07 MED ORDER — ALBUMIN HUMAN 5 % IV SOLN: INTRAVENOUS | Status: DC | PRN

## 2024-03-07 MED ORDER — CHLORHEXIDINE GLUCONATE 0.12 % MT SOLN
15.0000 mL | Freq: Once | OROMUCOSAL | Status: AC
  Administered 2024-03-07: 15 mL via OROMUCOSAL

## 2024-03-07 MED ORDER — DEXAMETHASONE SODIUM PHOSPHATE 10 MG/ML IJ SOLN
INTRAMUSCULAR | Status: DC | PRN
  Administered 2024-03-07: 10 mg via INTRAVENOUS

## 2024-03-07 MED ORDER — STERILE WATER FOR IRRIGATION IR SOLN
Status: DC | PRN
  Administered 2024-03-07: 1000 mL

## 2024-03-07 MED ORDER — CEFAZOLIN SODIUM-DEXTROSE 2-4 GM/100ML-% IV SOLN
2.0000 g | Freq: Three times a day (TID) | INTRAVENOUS | Status: AC
Start: 2024-03-07 — End: 2024-03-08
  Administered 2024-03-07 – 2024-03-08 (×3): 2 g via INTRAVENOUS
  Filled 2024-03-07 (×3): qty 100

## 2024-03-07 MED ORDER — PHENYLEPHRINE 80 MCG/ML (10ML) SYRINGE FOR IV PUSH (FOR BLOOD PRESSURE SUPPORT)
PREFILLED_SYRINGE | INTRAVENOUS | Status: DC | PRN
Start: 2024-03-07 — End: 2024-03-07
  Administered 2024-03-07 (×2): 160 ug via INTRAVENOUS
  Administered 2024-03-07 (×2): 80 ug via INTRAVENOUS

## 2024-03-07 MED ORDER — FENTANYL CITRATE (PF) 250 MCG/5ML IJ SOLN
INTRAMUSCULAR | Status: AC
Start: 2024-03-07 — End: ?
  Filled 2024-03-07: qty 5

## 2024-03-07 MED ORDER — HYDROMORPHONE HCL 1 MG/ML IJ SOLN
INTRAMUSCULAR | Status: AC
Start: 2024-03-07 — End: ?
  Filled 2024-03-07: qty 0.5

## 2024-03-07 MED ORDER — SODIUM CHLORIDE 0.9 % IV SOLN
INTRAVENOUS | Status: DC | PRN
Start: 2024-03-07 — End: 2024-03-07

## 2024-03-07 SURGICAL SUPPLY — 78 items
BAG COUNTER SPONGE SURGICOUNT (BAG) ×3 IMPLANT
BAG URINE DRAIN 2000ML AR STRL (UROLOGICAL SUPPLIES) IMPLANT
BIT DRILL 4.8X300 (BIT) IMPLANT
BIT DRILL AO MATTA 2.5MX230M (BIT) IMPLANT
BIT DRILL STEP 3.5 (DRILL) IMPLANT
BIT DRILL TWST MATTA 3.5MX195M (BIT) IMPLANT
BLADE CLIPPER SURG (BLADE) IMPLANT
BRUSH SCRUB EZ PLAIN DRY (MISCELLANEOUS) ×6 IMPLANT
CATH FOL 3WAY LX 18X30 (CATHETERS) IMPLANT
CLIP APPLIE 11 MED OPEN (CLIP) IMPLANT
COVER SURGICAL LIGHT HANDLE (MISCELLANEOUS) ×3 IMPLANT
DRAPE C-ARM 42X72 X-RAY (DRAPES) ×3 IMPLANT
DRAPE C-ARMOR (DRAPES) ×3 IMPLANT
DRAPE INCISE IOBAN 66X45 STRL (DRAPES) ×3 IMPLANT
DRAPE INCISE IOBAN 85X60 (DRAPES) ×3 IMPLANT
DRAPE LAPAROSCOPIC ABDOMINAL (DRAPES) IMPLANT
DRAPE LAPAROTOMY TRNSV 102X78 (DRAPES) ×3 IMPLANT
DRAPE SURG 17X23 STRL (DRAPES) ×3 IMPLANT
DRAPE SURG ORHT 6 SPLT 77X108 (DRAPES) ×6 IMPLANT
DRAPE U-SHAPE 47X51 STRL (DRAPES) ×3 IMPLANT
DRESSING MEPILEX FLEX 4X4 (GAUZE/BANDAGES/DRESSINGS) IMPLANT
DRSG MEPILEX POST OP 4X12 (GAUZE/BANDAGES/DRESSINGS) IMPLANT
DRSG MEPILEX POST OP 4X8 (GAUZE/BANDAGES/DRESSINGS) IMPLANT
ELECT BLADE 6.5 EXT (BLADE) ×3 IMPLANT
ELECTRODE REM PT RTRN 9FT ADLT (ELECTROSURGICAL) ×3 IMPLANT
EVACUATOR SILICONE 100CC (DRAIN) IMPLANT
GLOVE BIO SURGEON STRL SZ7.5 (GLOVE) ×3 IMPLANT
GLOVE BIO SURGEON STRL SZ8 (GLOVE) ×3 IMPLANT
GLOVE BIOGEL PI IND STRL 7.5 (GLOVE) ×3 IMPLANT
GLOVE BIOGEL PI IND STRL 8 (GLOVE) ×3 IMPLANT
GLOVE SURG ORTHO LTX SZ7.5 (GLOVE) ×6 IMPLANT
GOWN STRL REUS W/ TWL LRG LVL3 (GOWN DISPOSABLE) ×6 IMPLANT
GOWN STRL REUS W/ TWL XL LVL3 (GOWN DISPOSABLE) ×6 IMPLANT
KIT BASIN OR (CUSTOM PROCEDURE TRAY) ×3 IMPLANT
KIT TURNOVER KIT B (KITS) ×3 IMPLANT
MANIFOLD NEPTUNE II (INSTRUMENTS) ×3 IMPLANT
NS IRRIG 1000ML POUR BTL (IV SOLUTION) ×6 IMPLANT
PACK TOTAL JOINT (CUSTOM PROCEDURE TRAY) ×3 IMPLANT
PAD ARMBOARD POSITIONER FOAM (MISCELLANEOUS) ×6 IMPLANT
PIN GUIDE DRILL TIP 2.8X300 (DRILL) IMPLANT
PLATE BONE MATTA 10.5X58.5 H5 (Plate) IMPLANT
PLATE SUPRAPECTINEAL PELVIS (Plate) IMPLANT
PLATE SYMPHYSIS 92.5M 6H (Plate) IMPLANT
PLUG CATH AND CAP STRL 200 (CATHETERS) IMPLANT
RETRACTOR ONETRAX LX 90X20 (MISCELLANEOUS) IMPLANT
RETRIEVER SUT HEWSON (MISCELLANEOUS) ×3 IMPLANT
SCREW CANN 8.0X100MM (Screw) IMPLANT
SCREW CANN 8X160X40 (Screw) IMPLANT
SCREW CORT 2.5XFT 44X3.5XST (Screw) IMPLANT
SCREW CORT 48X3.5XST NS LF (Screw) IMPLANT
SCREW CORTEX ST MATTA 3.5X22MM (Screw) IMPLANT
SCREW CORTEX ST MATTA 3.5X24 (Screw) IMPLANT
SCREW CORTEX ST MATTA 3.5X26MM (Screw) IMPLANT
SCREW CORTEX ST MATTA 3.5X28MM (Screw) IMPLANT
SCREW CORTEX ST MATTA 3.5X30MM (Screw) IMPLANT
SCREW CORTEX ST MATTA 3.5X34MM (Screw) IMPLANT
SCREW CORTEX ST MATTA 3.5X36MM (Screw) IMPLANT
SCREW CORTEX ST MATTA 3.5X70MM (Screw) IMPLANT
SCREW CORTICAL 3.5X42MM (Screw) IMPLANT
SET HNDPC FAN SPRY TIP SCT (DISPOSABLE) ×3 IMPLANT
SPONGE T-LAP 18X18 ~~LOC~~+RFID (SPONGE) IMPLANT
STAPLER VISISTAT 35W (STAPLE) ×3 IMPLANT
STRIP CLOSURE SKIN 1/2X4 (GAUZE/BANDAGES/DRESSINGS) ×3 IMPLANT
SUCTION TUBE FRAZIER 10FR DISP (SUCTIONS) ×3 IMPLANT
SURGILUBE 2OZ TUBE FLIPTOP (MISCELLANEOUS) IMPLANT
SUT ETHILON 2 0 PSLX (SUTURE) ×6 IMPLANT
SUT VIC AB 0 CT1 27XBRD ANBCTR (SUTURE) ×6 IMPLANT
SUT VIC AB 1 CT1 18XCR BRD 8 (SUTURE) ×6 IMPLANT
SUT VIC AB 1 CT1 27XBRD ANBCTR (SUTURE) ×3 IMPLANT
SUT VIC AB 2-0 CT1 TAPERPNT 27 (SUTURE) ×6 IMPLANT
SUT VIC AB 2-0 SH 27XBRD (SUTURE) IMPLANT
SUTURE FIBERWR #2 38 T-5 BLUE (SUTURE) ×6 IMPLANT
TOWEL GREEN STERILE (TOWEL DISPOSABLE) ×6 IMPLANT
TOWEL GREEN STERILE FF (TOWEL DISPOSABLE) ×6 IMPLANT
TRAY FOLEY MTR SLVR 16FR STAT (SET/KITS/TRAYS/PACK) IMPLANT
TUBE SUCT ARGYLE STRL (TUBING) IMPLANT
WASHER CANN FLAT 8 (Washer) IMPLANT
WATER STERILE IRR 1000ML POUR (IV SOLUTION) ×12 IMPLANT
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
## 2024-03-07 NOTE — Anesthesia Procedure Notes (Addendum)
 Procedure Name: Intubation Date/Time: 03/07/2024 12:46 PM  Performed by: Alphia Jasmine, CRNAPre-anesthesia Checklist: Patient identified, Emergency Drugs available, Suction available, Timeout performed and Patient being monitored Patient Re-evaluated:Patient Re-evaluated prior to induction Oxygen Delivery Method: Circle system utilized Preoxygenation: Pre-oxygenation with 100% oxygen Induction Type: IV induction Ventilation: Mask ventilation without difficulty Laryngoscope Size: Mac and 4 Grade View: Grade I Tube type: Oral Tube size: 7.5 mm Number of attempts: 1 Airway Equipment and Method: Stylet Placement Confirmation: ETT inserted through vocal cords under direct vision, positive ETCO2, CO2 detector and breath sounds checked- equal and bilateral Secured at: 23 cm Tube secured with: Tape Dental Injury: Teeth and Oropharynx as per pre-operative assessment

## 2024-03-07 NOTE — Anesthesia Postprocedure Evaluation (Signed)
 Anesthesia Post Note  Patient: [name]  Procedure(s) Performed: OPEN REDUCTION INTERNAL FIXATION (ORIF) ACETABULAR FRACTURE (Right: Shoulder) OPEN REDUCTION INTERNAL FIXATION (ORIF) PELVIC FRACTURE REPAIR, BLADDER (Abdomen)     Patient location during evaluation: PACU Anesthesia Type: General Level of consciousness: awake and alert Pain management: pain level controlled Vital Signs Assessment: post-procedure vital signs reviewed and stable Respiratory status: spontaneous breathing, nonlabored ventilation, respiratory function stable and patient connected to nasal cannula oxygen Cardiovascular status: blood pressure returned to baseline and stable Postop Assessment: no apparent nausea or vomiting Anesthetic complications: no   No notable events documented.  Last Vitals:  Vitals:   03/07/24 1830 03/07/24 1845  BP: (!) 164/112 (!) 153/108  Pulse: 74 94  Resp: 11 13  Temp:    SpO2: 99% 100%    Last Pain:  Vitals:   03/07/24 1146  TempSrc:   PainSc: Asleep                 Erin Havers

## 2024-03-07 NOTE — Progress Notes (Signed)
 Patient not consentable for surgery in my opinion. UDS+ for BZD and cocaine. Rec'd BZD in ED, so this may be a result of iatrogenic administration, however he likely has some residual cocaine effect. Recommend proceeding with surgery for management of his unstable open book pelvis fracture with concomitant repair of EP bladder injury under emergent consent.   Anda Bamberg, MD General and Trauma Surgery [facility]
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
## 2024-03-07 NOTE — Anesthesia Preprocedure Evaluation (Addendum)
 Anesthesia Evaluation  Patient identified by MRN, date of birth, ID bandGeneral Assessment Comment:Patient unable to stay awake for pre-op evaluation. Constantly falling asleep or trailing off in his responses to questions.  Reviewed: Allergy & Precautions, NPO status , Patient's Chart, lab work & pertinent test results  Airway Mallampati: II  TM Distance: >3 FB Neck ROM: Full    Dental  (+) Teeth Intact, Dental Advisory Given   Pulmonary neg pulmonary ROS   Pulmonary exam normal breath sounds clear to auscultation       Cardiovascular negative cardio ROS Normal cardiovascular exam Rhythm:Regular Rate:Normal     Neuro/Psych  PSYCHIATRIC DISORDERS    Schizophrenia  negative neurological ROS     GI/Hepatic negative GI ROS, Neg liver ROS,,,  Endo/Other  negative endocrine ROS    Renal/GU negative Renal ROS     Musculoskeletal Pelvic fracture   Abdominal   Peds  Hematology negative hematology ROS (+)   Anesthesia Other Findings Day of surgery medications reviewed with the patient.  Reproductive/Obstetrics                             Anesthesia Physical Anesthesia Plan  ASA: 2 and emergent  Anesthesia Plan: General   Post-op Pain Management: Ofirmev  IV (intra-op)* and Toradol IV (intra-op)*   Induction: Intravenous  PONV Risk Score and Plan: 2 and Midazolam , Dexamethasone  and Ondansetron   Airway Management Planned: Oral ETT  Additional Equipment:   Intra-op Plan:   Post-operative Plan: Extubation in OR  Informed Consent: I have reviewed the patients History and Physical, chart, labs and discussed the procedure including the risks, benefits and alternatives for the proposed anesthesia with the patient or authorized representative who has indicated his/her understanding and acceptance.     History available from chart only and Only emergency history available  Plan Discussed with:  CRNA  Anesthesia Plan Comments: (Surgeon declaring case an emergency as patient is unable to consent for himself and no family is available.)       Anesthesia Quick Evaluation

## 2024-03-07 NOTE — Plan of Care (Signed)

## 2024-03-07 NOTE — Transfer of Care (Signed)
 Immediate Anesthesia Transfer of Care Note  Patient: [name]  Procedure(s) Performed: OPEN REDUCTION INTERNAL FIXATION (ORIF) ACETABULAR FRACTURE (Right: Shoulder) OPEN REDUCTION INTERNAL FIXATION (ORIF) PELVIC FRACTURE REPAIR, BLADDER (Abdomen)  Patient Location: PACU  Anesthesia Type:General  Level of Consciousness: awake and drowsy  Airway & Oxygen Therapy: Patient Spontanous Breathing and Patient connected to face mask oxygen  Post-op Assessment: Report given to RN and Post -op Vital signs reviewed and stable  Post vital signs: Reviewed and stable  Last Vitals:  Vitals Value Taken Time  BP 148/94 03/07/24 1752  Temp    Pulse 78 03/07/24 1759  Resp 15 03/07/24 1759  SpO2 100 % 03/07/24 1759  Vitals shown include unfiled device data.  Last Pain:  Vitals:   03/07/24 1146  TempSrc:   PainSc: Asleep         Complications: No notable events documented.
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
## 2024-03-07 NOTE — Brief Op Note (Signed)
 03/05/2024 - 03/07/2024  2:20 PM  PATIENT:  [name]  28 y.o. male  PRE-OPERATIVE DIAGNOSIS:  Pelvic fracture, bladder rupture  POST-OPERATIVE DIAGNOSIS:  * No post-op diagnosis entered *  PROCEDURE:   Repair Bladder Rupture  SURGEON:  Surgeons and Role:     * Manny, Harvey Linen., MD - Primary  PHYSICIAN ASSISTANT:   ASSISTANTS: none   ANESTHESIA:   general  EBL:  150 mL   BLOOD ADMINISTERED:none  DRAINS: foley to gravity irrigation port plugged   LOCAL MEDICATIONS USED:  NONE  SPECIMEN:  No Specimen  DISPOSITION OF SPECIMEN:  N/A  COUNTS:  YES  TOURNIQUET:  * No tourniquets in log *  DICTATION: .Other Dictation: Dictation Number [id number]  PLAN OF CARE: remain in OR 3  PATIENT DISPOSITION:  as above   Delay start of Pharmacological VTE agent (>24hrs) due to surgical blood loss or risk of bleeding: yes

## 2024-03-07 NOTE — Consult Note (Signed)
 The client is currently in surgery, psych will continue to follow.  Psych attempted to assess yesterday but would not awaken.    Roslynn Coombes

## 2024-03-08 ENCOUNTER — Inpatient Hospital Stay (HOSPITAL_COMMUNITY)

## 2024-03-08 DIAGNOSIS — F251 Schizoaffective disorder, depressive type: Secondary | ICD-10-CM

## 2024-03-08 DIAGNOSIS — N3289 Other specified disorders of bladder: Secondary | ICD-10-CM

## 2024-03-08 MED ORDER — ARIPIPRAZOLE 5 MG PO TABS
5.0000 mg | ORAL_TABLET | Freq: Every day | ORAL | Status: DC
  Administered 2024-03-10 – 2024-03-22 (×13): 5 mg via ORAL
  Filled 2024-03-08 (×13): qty 1

## 2024-03-08 MED ORDER — PREGABALIN 75 MG PO CAPS
75.0000 mg | ORAL_CAPSULE | Freq: Three times a day (TID) | ORAL | Status: DC
  Administered 2024-03-08 – 2024-03-10 (×7): 75 mg via ORAL
  Filled 2024-03-08 (×7): qty 1

## 2024-03-08 MED ORDER — ARIPIPRAZOLE 2 MG PO TABS
2.0000 mg | ORAL_TABLET | Freq: Every day | ORAL | Status: AC
  Administered 2024-03-08 – 2024-03-09 (×2): 2 mg via ORAL
  Filled 2024-03-08 (×2): qty 1
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
## 2024-03-08 NOTE — TOC CAGE-AID Note (Signed)
 Transition of Care [facility]) - CAGE-AID Screening   Patient Details  Name: [name] MRN: [id number] Date of Birth: 01-14-1996  Transition of Care [facility]) CM/SW Contact:    Ayme Short E Culley Hedeen, LCSW Phone Number: 03/08/2024, 11:09 AM   Clinical Narrative:    CAGE-AID Screening: Substance Abuse Screening unable to be completed due to: : Patient unable to participate (disoriented)

## 2024-03-08 NOTE — Plan of Care (Signed)
  Problem: Education: Goal: Knowledge of General Education information will improve Description: Including pain rating scale, medication(s)/side effects and non-pharmacologic comfort measures Outcome: Not Progressing   Problem: Health Behavior/Discharge Planning: Goal: Ability to manage health-related needs will improve Outcome: Not Progressing   Problem: Clinical Measurements: Goal: Ability to maintain clinical measurements within normal limits will improve Outcome: Progressing Goal: Will remain free from infection Outcome: Progressing Goal: Diagnostic test results will improve Outcome: Progressing Goal: Respiratory complications will improve Outcome: Progressing Goal: Cardiovascular complication will be avoided Outcome: Progressing   Problem: Activity: Goal: Risk for activity intolerance will decrease Outcome: Not Progressing   Problem: Nutrition: Goal: Adequate nutrition will be maintained Outcome: Progressing   Problem: Coping: Goal: Level of anxiety will decrease Outcome: Not Progressing   Problem: Elimination: Goal: Will not experience complications related to bowel motility Outcome: Not Progressing Goal: Will not experience complications related to urinary retention Outcome: Not Progressing   Problem: Pain Managment: Goal: General experience of comfort will improve and/or be controlled Outcome: Not Progressing   Problem: Safety: Goal: Ability to remain free from injury will improve Outcome: Progressing   Problem: Skin Integrity: Goal: Risk for impaired skin integrity will decrease Outcome: Progressing
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
## 2024-03-08 NOTE — Progress Notes (Deleted)
 Orthopedic Tech Progress Note Patient Details:  [name] Mar 03, 1996 [id number]  Sorry but we do not have any BONE FOAMS at this time. Will se if we can get a few from [place]   Patient ID: [name], male   DOB: [birth year], 28 y.o.   MRN: [id number]  [name] 03/08/2024, 2:06 PM

## 2024-03-08 NOTE — Progress Notes (Signed)
 Urology Inpatient Progress Note  Subjective: POD #1 s/p closure of bladder rupture. Good UOP. Resting comfortably this AM.  Anti-infectives: Anti-infectives (From admission, onward)    Start     Dose/Rate Route Frequency Ordered Stop   03/07/24 2200  ceFAZolin  (ANCEF ) IVPB 2g/100 mL premix        2 g 200 mL/hr over 30 Minutes Intravenous Every 8 hours 03/07/24 1942 03/08/24 2159   03/07/24 1556  vancomycin  (VANCOCIN ) powder  Status:  Discontinued          As needed 03/07/24 1557 03/07/24 1749   03/07/24 1200  ceFAZolin  (ANCEF ) IVPB 2g/100 mL premix        2 g 200 mL/hr over 30 Minutes Intravenous On call to O.R. 03/07/24 0926 03/07/24 1320   03/05/24 1630  ceFAZolin  (ANCEF ) IVPB 2g/100 mL premix        2 g 200 mL/hr over 30 Minutes Intravenous  Once 03/05/24 1616 03/05/24 1640       Current Facility-Administered Medications  Medication Dose Route Frequency Provider Last Rate Last Admin   0.9 %  sodium chloride  infusion (Manually program via Guardrails IV Fluids)   Intravenous Once Marisela Sicks, PA-C   Held at 03/05/24 1644   0.9 %  sodium chloride  infusion (Manually program via Guardrails IV Fluids)   Intravenous Once Marisela Sicks, PA-C   Held at 03/05/24 1644   0.9 %  sodium chloride  infusion (Manually program via Guardrails IV Fluids)   Intravenous Once Marisela Sicks, PA-C   Held at 03/05/24 1650   0.9 %  sodium chloride  infusion (Manually program via Guardrails IV Fluids)   Intravenous Once Marisela Sicks, PA-C       acetaminophen  (TYLENOL ) tablet 1,000 mg  1,000 mg Oral Q6H Marisela Sicks, PA-C   1,000 mg at 03/08/24 0557   ceFAZolin  (ANCEF ) IVPB 2g/100 mL premix  2 g Intravenous Q8H Marisela Sicks, PA-C 200 mL/hr at 03/08/24 0600 2 g at 03/08/24 0600   Chlorhexidine  Gluconate Cloth 2 % PADS 6 each  6 each Topical Daily Marisela Sicks, PA-C   6 each at 03/08/24 1610   docusate sodium  (COLACE) capsule 100 mg  100 mg Oral BID Marisela Sicks, PA-C   100 mg at 03/08/24 9604   enoxaparin  (LOVENOX )  injection 30 mg  30 mg Subcutaneous Q12H Marisela Sicks, PA-C   30 mg at 03/07/24 2126   haloperidol  lactate (HALDOL ) injection 10 mg  10 mg Intravenous Q6H PRN Marisela Sicks, PA-C       Or   haloperidol  lactate (HALDOL ) injection 10 mg  10 mg Intramuscular Q6H PRN Marisela Sicks, PA-C       hydrALAZINE  (APRESOLINE ) injection 10 mg  10 mg Intravenous Q2H PRN Marisela Sicks, PA-C       lidocaine  (XYLOCAINE ) 0.5 % (with pres) injection 10 mL  10 mL Intradermal Once Marisela Sicks, PA-C       methocarbamol  (ROBAXIN ) tablet 1,000 mg  1,000 mg Oral Q8H Marisela Sicks, PA-C   1,000 mg at 03/08/24 5409   metoprolol  tartrate (LOPRESSOR ) injection 5 mg  5 mg Intravenous Q6H PRN Marisela Sicks, PA-C       morphine  (PF) 4 MG/ML injection 4 mg  4 mg Intravenous Q4H PRN Marisela Sicks, PA-C       ondansetron  (ZOFRAN -ODT) disintegrating tablet 4 mg  4 mg Oral Q6H PRN Marisela Sicks, PA-C       Or   ondansetron  (ZOFRAN ) injection 4 mg  4 mg Intravenous Q6H PRN Marisela Sicks,  PA-C       oxyCODONE  (Oxy IR/ROXICODONE ) immediate release tablet 5-10 mg  5-10 mg Oral Q4H PRN Marisela Sicks, PA-C   10 mg at 03/08/24 1610   polyethylene glycol (MIRALAX  / GLYCOLAX ) packet 17 g  17 g Oral Daily PRN Marisela Sicks, PA-C         Objective: Vital signs in last 24 hours: Temp:  [97.5 F (36.4 C)-98.9 F (37.2 C)] 98.6 F (37 C) (05/26 0758) Pulse Rate:  [74-98] 98 (05/26 0758) Resp:  [11-20] 16 (05/26 0758) BP: (112-164)/(67-112) 122/69 (05/26 0758) SpO2:  [97 %-100 %] 97 % (05/26 0758) Weight:  [80.6 kg] 80.6 kg (05/25 0949)  Intake/Output from previous day: 05/25 0701 - 05/26 0700 In: 3566 [I.V.:2200; Blood:315; IV Piggyback:1051] Out: 2800 [Urine:1500; Blood:1300] Intake/Output this shift: No intake/output data recorded.  GENERAL APPEARANCE:  Well appearing, well developed, well nourished, NAD ABDOMEN:  Soft, dressing intact NEUROLOGIC:  CN II-XII grossly intact MENTAL STATUS:  appropriate SKIN:  Warm, dry, and intact GU:  foley  draining blood tinged urine   Lab Results:  Recent Labs    03/05/24 1522 03/05/24 1539 03/05/24 2107 03/05/24 2108 03/06/24 0555 03/07/24 1517 03/07/24 1641  WBC 8.6  --   --   --  7.9  --   --   HGB 12.6*   < >  --    < > 13.8 9.2* 9.2*  HCT 40.2   < >  --    < > 41.6 27.0* 27.0*  PLT 311  --  211  --  195  --   --    < > = values in this interval not displayed.   BMET Recent Labs    03/05/24 1522 03/05/24 1539 03/06/24 0555 03/07/24 1517 03/07/24 1641  NA 141 142 141 133* 134*  K 4.4 4.4 4.4 4.7 5.1  CL 108 109 108  --   --   CO2 16*  --  19*  --   --   GLUCOSE 175* 172* 104*  --   --   BUN 19 24* 21*  --   --   CREATININE 1.61* 1.60* 1.22  --   --   CALCIUM  8.7*  --  8.8*  --   --    PT/INR Recent Labs    03/05/24 1522 03/05/24 2107  LABPROT 14.0 14.3  INR 1.1 1.1   ABG Recent Labs    03/07/24 1517 03/07/24 1641  PHART  --  7.336*  HCO3 25.2 24.1    Studies/Results: DG Foot Complete Left Result Date: 03/07/2024 CLINICAL DATA:  Fracture EXAM: LEFT ANKLE - 2 VIEW; LEFT FOOT - COMPLETE 3+ VIEW COMPARISON:  None Available. FINDINGS: Left ankle: Frontal, oblique, and lateral views of the left ankle are obtained. There is a small ossific density interposed between the lateral malleolus and lateral aspect of the talus on the frontal view, consistent with avulsion fracture. Donor site is likely from the lateral talus. No other acute bony abnormalities. The ankle mortise is intact. Joint spaces are well preserved. Prominent anterior and lateral soft tissue swelling. Left foot: Frontal, oblique, and lateral views are obtained. No additional acute fractures. Alignment is anatomic. Joint spaces are well preserved. Diffuse soft tissue swelling greatest within the dorsum of the midfoot and forefoot. IMPRESSION: 1. Small avulsion fracture off the lateral margin of the talus, only seen on the frontal view of the ankle. 2. Diffuse soft tissue swelling of the ankle and foot.  Electronically Signed  By: Bobbye Burrow M.D.   On: 03/07/2024 20:17   DG Ankle 2 Views Left Result Date: 03/07/2024 CLINICAL DATA:  Fracture EXAM: LEFT ANKLE - 2 VIEW; LEFT FOOT - COMPLETE 3+ VIEW COMPARISON:  None Available. FINDINGS: Left ankle: Frontal, oblique, and lateral views of the left ankle are obtained. There is a small ossific density interposed between the lateral malleolus and lateral aspect of the talus on the frontal view, consistent with avulsion fracture. Donor site is likely from the lateral talus. No other acute bony abnormalities. The ankle mortise is intact. Joint spaces are well preserved. Prominent anterior and lateral soft tissue swelling. Left foot: Frontal, oblique, and lateral views are obtained. No additional acute fractures. Alignment is anatomic. Joint spaces are well preserved. Diffuse soft tissue swelling greatest within the dorsum of the midfoot and forefoot. IMPRESSION: 1. Small avulsion fracture off the lateral margin of the talus, only seen on the frontal view of the ankle. 2. Diffuse soft tissue swelling of the ankle and foot. Electronically Signed   By: Bobbye Burrow M.D.   On: 03/07/2024 20:17   DG Foot Complete Right Result Date: 03/07/2024 CLINICAL DATA:  Fracture EXAM: RIGHT FOOT COMPLETE - 3+ VIEW; RIGHT ANKLE - 2 VIEW COMPARISON:  None FINDINGS: Right ankle: Frontal and lateral views are obtained. Alignment is anatomic. No acute displaced fracture. Joint spaces are well preserved. Soft tissues are unremarkable. Right foot: Frontal, oblique, and lateral views are obtained. No acute displaced fracture, subluxation, or dislocation. Joint spaces are well preserved. Soft tissues are unremarkable. IMPRESSION: 1. Unremarkable right foot and ankle.  No acute displaced fracture. Electronically Signed   By: Bobbye Burrow M.D.   On: 03/07/2024 20:15   DG Ankle 2 Views Right Result Date: 03/07/2024 CLINICAL DATA:  Fracture EXAM: RIGHT FOOT COMPLETE - 3+ VIEW; RIGHT  ANKLE - 2 VIEW COMPARISON:  None FINDINGS: Right ankle: Frontal and lateral views are obtained. Alignment is anatomic. No acute displaced fracture. Joint spaces are well preserved. Soft tissues are unremarkable. Right foot: Frontal, oblique, and lateral views are obtained. No acute displaced fracture, subluxation, or dislocation. Joint spaces are well preserved. Soft tissues are unremarkable. IMPRESSION: 1. Unremarkable right foot and ankle.  No acute displaced fracture. Electronically Signed   By: Bobbye Burrow M.D.   On: 03/07/2024 20:15   DG Pelvis Comp Min 3V Result Date: 03/07/2024 CLINICAL DATA:  Fracture EXAM: JUDET PELVIS - 3+ VIEW COMPARISON:  03/05/2024, 03/07/2024 FINDINGS: Pelvic inlet, pelvic outlet, and frontal views of the pelvis are obtained on 3 images. Cannulated screw traverses the bilateral sacroiliac joints. Malleable plate and screw fixation is seen along the right iliac bone extending across the pubic symphysis. The comminuted right acetabular and right rami fractures seen previously are in near anatomic alignment. There has been reduction of the prior diastasis of the pubic symphysis. Joint spaces of the bilateral hips are well preserved and symmetrical. IMPRESSION: 1. ORIF of comminuted right pelvic fracture, with near anatomic alignment and reduction of the diastasis of the symphysis pubis seen previously. 2. Cannulated screw traversing the bilateral sacroiliac joints, with near anatomic alignment. Electronically Signed   By: Bobbye Burrow M.D.   On: 03/07/2024 20:12   DG Pelvis Comp Min 3V Result Date: 03/07/2024 CLINICAL DATA:  Elective surgery. EXAM: JUDET PELVIS - 3+ VIEW COMPARISON:  Pelvic radiograph 03/05/2024 FINDINGS: Twenty fluoroscopic spot views of the pelvis submitted from the operating room. Plate and screw fixation of pubic symphyseal diastasis. Screw fixation as well  as plate and screw fixation of right acetabular and hemi pelvic fractures. Sacroiliac screw fixation  of sacroiliac diastasis. Fluoroscopy time 2 minutes 27 seconds. Dose 37.07 mGy. IMPRESSION: Intraoperative fluoroscopy during pelvic fracture fixation. Electronically Signed   By: Chadwick Colonel M.D.   On: 03/07/2024 18:02   DG Pelvis Portable Result Date: 03/07/2024 CLINICAL DATA:  Status post bladder repair. EXAM: PORTABLE PELVIS 1 VIEWS COMPARISON:  None Available. FINDINGS: Extensive hardware identified. Crossing right-to-left screw along the sacroiliac joints. Multiple malleable plates and screws along the right hemipelvis. Multiple fractures seen along the right pubic bone, acetabulum. Left acetabular fractures suggested. There is plate crossing the previously diastatic pubic symphysis. Metallic staple seen in the soft tissues lateral to the proximal right femur at the edge of the imaging field. There is also some clips in the right hemipelvis just above the malleable plates on this rotated portable limited view. The upper portion of the like crests are clipped off the edge of the film. No additional radiopaque foreign bodies seen at this time. Critical Value/emergent results were called by telephone at the time of interpretation on 03/07/2024 at 5:27 pm to operating room, aaron wolf, who verbally acknowledged these results. IMPRESSION: Extensive pelvic fixation hardware with fractures. Surgical clips along the right hemipelvis. Stables lateral to the right proximal femur. Limited portable rotated x-ray. The top of the iliac crests are clipped off the edge of the film. Electronically Signed   By: Adrianna Horde M.D.   On: 03/07/2024 17:29   DG C-Arm 1-60 Min-No Report Result Date: 03/07/2024 Fluoroscopy was utilized by the requesting physician.  No radiographic interpretation.   DG C-Arm 1-60 Min-No Report Result Date: 03/07/2024 Fluoroscopy was utilized by the requesting physician.  No radiographic interpretation.   DG C-Arm 1-60 Min-No Report Result Date: 03/07/2024 Fluoroscopy was utilized by the  requesting physician.  No radiographic interpretation.   DG C-Arm 1-60 Min-No Report Result Date: 03/07/2024 Fluoroscopy was utilized by the requesting physician.  No radiographic interpretation.   DG C-Arm 1-60 Min-No Report Result Date: 03/07/2024 Fluoroscopy was utilized by the requesting physician.  No radiographic interpretation.     Assessment & Plan: POD #1 s/p closure of bladder rupture  Stable post-op. Continue foley for at least 14 days Will need cystogram prior to foley removal   Oda Bence, MD 03/08/2024

## 2024-03-08 NOTE — Progress Notes (Signed)
 Orthopedic Tech Progress Note Patient Details:  [name] November 03, 1995 [id number]  Ortho Devices Type of Ortho Device: Prafo boot/shoe Ortho Device/Splint Location: RLE Ortho Device/Splint Interventions: Ordered, Application   Post Interventions Patient Tolerated: Fair Instructions Provided: Care of device  Kermitt Pedlar 03/08/2024, 6:21 PM

## 2024-03-08 NOTE — Plan of Care (Signed)
  Problem: Clinical Measurements: Goal: Ability to maintain clinical measurements within normal limits will improve Outcome: Progressing Goal: Will remain free from infection Outcome: Progressing   Problem: Nutrition: Goal: Adequate nutrition will be maintained Outcome: Progressing   Problem: Pain Managment: Goal: General experience of comfort will improve and/or be controlled Outcome: Progressing   Problem: Safety: Goal: Ability to remain free from injury will improve Outcome: Progressing   Problem: Skin Integrity: Goal: Risk for impaired skin integrity will decrease Outcome: Progressing

## 2024-03-08 NOTE — Progress Notes (Signed)
 Trauma/Critical Care Follow Up Note  Subjective:    Overnight Issues:   Objective:  Vital signs for last 24 hours: Temp:  [97.5 F (36.4 C)-98.9 F (37.2 C)] 98.6 F (37 C) (05/26 0758) Pulse Rate:  [74-98] 98 (05/26 0758) Resp:  [11-20] 16 (05/26 0758) BP: (112-164)/(67-112) 122/69 (05/26 0758) SpO2:  [97 %-100 %] 97 % (05/26 0758) Weight:  [80.6 kg] 80.6 kg (05/25 0949)  Hemodynamic parameters for last 24 hours:    Intake/Output from previous day: 05/25 0701 - 05/26 0700 In: 3566 [I.V.:2200; Blood:315; IV Piggyback:1051] Out: 2800 [Urine:1500; Blood:1300]  Intake/Output this shift: No intake/output data recorded.  Vent settings for last 24 hours:    Physical Exam:  Gen: comfortable, no distress Neuro: follows commands HEENT: PERRL Neck: supple CV: RRR Pulm: unlabored breathing on RA Abd: soft, NT  , no recent BM GU: urine clear and yellow, +Foley Extr: wwp, no edema  Results for orders placed or performed during the hospital encounter of 03/05/24 (from the past 24 hours)  Prepare RBC (crossmatch)     Status: None   Collection Time: 03/07/24  3:12 PM  Result Value Ref Range   Order Confirmation      ORDER PROCESSED BY BLOOD BANK Performed at [facility], [address] [place], [place] [address]   POCT I-Stat EG7     Status: Abnormal   Collection Time: 03/07/24  3:17 PM  Result Value Ref Range   pH, Ven 7.369 7.25 - 7.43   pCO2, Ven 43.0 (L) 44 - 60 mmHg   pO2, Ven 179 (H) 32 - 45 mmHg   Bicarbonate 25.2 20.0 - 28.0 mmol/L   TCO2 27 22 - 32 mmol/L   O2 Saturation 100 %   Acid-base deficit 1.0 0.0 - 2.0 mmol/L   Sodium 133 (L) 135 - 145 mmol/L   Potassium 4.7 3.5 - 5.1 mmol/L   Calcium , Ion 1.11 (L) 1.15 - 1.40 mmol/L   HCT 27.0 (L) 39.0 - 52.0 %   Hemoglobin 9.2 (L) 13.0 - 17.0 g/dL   Patient temperature 60.4 C    Sample type VENOUS   I-STAT 7, (LYTES, BLD GAS, ICA, H+H)     Status: Abnormal   Collection Time: 03/07/24  4:41 PM  Result  Value Ref Range   pH, Arterial 7.336 (L) 7.35 - 7.45   pCO2 arterial 44.2 32 - 48 mmHg   pO2, Arterial 51 (L) 83 - 108 mmHg   Bicarbonate 24.1 20.0 - 28.0 mmol/L   TCO2 26 22 - 32 mmol/L   O2 Saturation 86 %   Acid-base deficit 2.0 0.0 - 2.0 mmol/L   Sodium 134 (L) 135 - 145 mmol/L   Potassium 5.1 3.5 - 5.1 mmol/L   Calcium , Ion 1.07 (L) 1.15 - 1.40 mmol/L   HCT 27.0 (L) 39.0 - 52.0 %   Hemoglobin 9.2 (L) 13.0 - 17.0 g/dL   Patient temperature 54.0 C    Sample type ARTERIAL     Assessment & Plan:  Present on Admission:  Pelvic fracture (HCC)  Schizoaffective disorder, unspecified (HCC)    LOS: 3 days   Additional comments:I reviewed the patient's new clinical lab test results.   and I reviewed the patients new imaging test results.    Ped struck   Open book pelvis - OR 5/25 with Dr. Alba Ally for ORIF - mobility recs as per ortho, pending notes from there service at present Extraperitoneal bladder injury and scrotal hematoma - repaired  in OR by urology Dr. Secundino Dach 5/25. Cont foley for now H/o schizophrenia - appears home med is abilify  per chart review, will have RPh confirm, psych c/s for dosing recommendations. Would prefer injectable version. H/o SUD, cannabis - TOC c/s FEN - diet if no OR today, LR bolus DVT - SCDs, start LMWH Dispo - TTF   I spent a total of 35 minutes today in both face-to-face and non-face-to-face activities to perform the following: review records, take and update history, examine the patient, counsel the patient on the diagnosis, and document encounter, findings, and plan in the EHR  for this visit on the date of this encounter.   Beatris Lincoln, MD [facility]  03/08/2024  *Care during the described time interval was provided by me. I have reviewed this patient's available data, including medical history, events of note, physical examination and test results as part of my evaluation.

## 2024-03-08 NOTE — Brief Op Note (Incomplete)
TBA

## 2024-03-09 ENCOUNTER — Inpatient Hospital Stay (HOSPITAL_COMMUNITY)

## 2024-03-09 LAB — TYPE AND SCREEN
ABO/RH(D): A POS
Antibody Screen: NEGATIVE
Unit division: 0
Unit division: 0
Unit division: 0
Unit division: 0
Unit division: 0
Unit division: 0
Unit division: 0
Unit division: 0
Unit division: 0
Unit division: 0
Unit division: 0
Unit division: 0
Unit division: 0
Unit division: 0
Unit division: 0
Unit division: 0

## 2024-03-09 LAB — BPAM RBC
Blood Product Expiration Date: 202506092359
Blood Product Expiration Date: 202506092359
Blood Product Expiration Date: 202506122359
Blood Product Expiration Date: 202506132359
Blood Product Expiration Date: 202506132359
Blood Product Expiration Date: 202506132359
Blood Product Expiration Date: 202506132359
Blood Product Expiration Date: 202506182359
Blood Product Expiration Date: 202506282359
Blood Product Expiration Date: 202506282359
Blood Product Expiration Date: 202506282359
Blood Product Expiration Date: 202506282359
Blood Product Expiration Date: 202506282359
Blood Product Expiration Date: 202506282359
Blood Product Expiration Date: 202506282359
Blood Product Expiration Date: 202506282359
ISSUE DATE / TIME: 202505231543
ISSUE DATE / TIME: 202505231549
ISSUE DATE / TIME: 202505232332
ISSUE DATE / TIME: 202505240819
ISSUE DATE / TIME: 202505240918
ISSUE DATE / TIME: 202505240918
ISSUE DATE / TIME: 202505240918
ISSUE DATE / TIME: 202505240918
ISSUE DATE / TIME: 202505240918
ISSUE DATE / TIME: 202505240918
ISSUE DATE / TIME: 202505240918
ISSUE DATE / TIME: 202505240918
ISSUE DATE / TIME: 202505241128
ISSUE DATE / TIME: 202505241143
ISSUE DATE / TIME: 202505251537
ISSUE DATE / TIME: 202505251537
Unit Type and Rh: 5100
Unit Type and Rh: 5100
Unit Type and Rh: 5100
Unit Type and Rh: 5100
Unit Type and Rh: 5100
Unit Type and Rh: 5100
Unit Type and Rh: 5100
Unit Type and Rh: 5100
Unit Type and Rh: 5100
Unit Type and Rh: 5100
Unit Type and Rh: 5100
Unit Type and Rh: 5100
Unit Type and Rh: 5100
Unit Type and Rh: 5100
Unit Type and Rh: 6200
Unit Type and Rh: 6200

## 2024-03-09 LAB — CBC
HCT: 26 % — ABNORMAL LOW (ref 39.0–52.0)
Hemoglobin: 8.8 g/dL — ABNORMAL LOW (ref 13.0–17.0)
MCH: 31.2 pg (ref 26.0–34.0)
MCHC: 33.8 g/dL (ref 30.0–36.0)
MCV: 92.2 fL (ref 80.0–100.0)
Platelets: 141 10*3/uL — ABNORMAL LOW (ref 150–400)
RBC: 2.82 MIL/uL — ABNORMAL LOW (ref 4.22–5.81)
RDW: 13.3 % (ref 11.5–15.5)
WBC: 5.7 10*3/uL (ref 4.0–10.5)
nRBC: 0 % (ref 0.0–0.2)

## 2024-03-09 MED ORDER — METHOCARBAMOL 500 MG PO TABS
1000.0000 mg | ORAL_TABLET | Freq: Four times a day (QID) | ORAL | Status: DC
  Administered 2024-03-09 – 2024-03-22 (×52): 1000 mg via ORAL
  Filled 2024-03-09 (×52): qty 2

## 2024-03-09 MED ORDER — POLYETHYLENE GLYCOL 3350 17 G PO PACK
17.0000 g | PACK | Freq: Every day | ORAL | Status: DC
  Administered 2024-03-09 – 2024-03-16 (×8): 17 g via ORAL
  Filled 2024-03-09 (×8): qty 1

## 2024-03-09 MED ORDER — OXYBUTYNIN CHLORIDE 5 MG PO TABS: 5.0000 mg | ORAL_TABLET | Freq: Two times a day (BID) | ORAL | Status: DC

## 2024-03-09 MED ORDER — OXYBUTYNIN CHLORIDE 5 MG PO TABS
5.0000 mg | ORAL_TABLET | Freq: Two times a day (BID) | ORAL | Status: AC
  Administered 2024-03-09 – 2024-03-13 (×9): 5 mg via ORAL
  Filled 2024-03-09 (×9): qty 1
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
## 2024-03-09 NOTE — Progress Notes (Signed)
 Inpatient Rehab Admissions Coordinator:   Per therapy recommendations pt was screened for CIR by Loye Rumble, PT, DPT.  Note requiring 3 person assist OOB.  Will follow for progress with therapy to determine candidacy.    Loye Rumble, PT, DPT Admissions Coordinator [phone] 03/09/24  5:04 PM

## 2024-03-09 NOTE — Plan of Care (Signed)
 Patient required multiple reinforcement on education by nurse.  Refusing tele. Day shift reported MD will DC order. Patient requiring plenty of encouragement for q2 turns (refused most) and peri/foley care to be completed. Pain managed with prn oral prescribed medication and scheduled medications. Requiring constant redirection and encouragement for shift.   Problem: Elimination: Goal: Will not experience complications related to bowel motility Outcome: Progressing   Problem: Pain Managment: Goal: General experience of comfort will improve and/or be controlled Outcome: Progressing   Problem: Education: Goal: Knowledge of General Education information will improve Description: Including pain rating scale, medication(s)/side effects and non-pharmacologic comfort measures Outcome: Not Progressing   Problem: Nutrition: Goal: Adequate nutrition will be maintained Outcome: Not Progressing   Problem: Coping: Goal: Level of anxiety will decrease Outcome: Not Progressing
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
## 2024-03-09 NOTE — Plan of Care (Addendum)
 Patient refused telemetry, lovenox , SCDs. Pt educated on rationale of cardiac monitoring and DVT prophylaxis. Pt motivated to work with Physical Therapy today to get OOB and into chair (See PT Note). Pt complained of lower abdominal discomfort and feels urges to urinate despite foley. PA aware, ordered Oxybutynin  and Xray.   Problem: Education: Goal: Knowledge of General Education information will improve Description: Including pain rating scale, medication(s)/side effects and non-pharmacologic comfort measures Outcome: Progressing   Problem: Clinical Measurements: Goal: Will remain free from infection Outcome: Progressing   Problem: Activity: Goal: Risk for activity intolerance will decrease Outcome: Progressing   Problem: Nutrition: Goal: Adequate nutrition will be maintained Outcome: Progressing   Problem: Elimination: Goal: Will not experience complications related to bowel motility Outcome: Progressing
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
## 2024-03-10 ENCOUNTER — Encounter (HOSPITAL_COMMUNITY): Payer: Self-pay | Admitting: Orthopedic Surgery

## 2024-03-10 MED ORDER — PREGABALIN 100 MG PO CAPS
100.0000 mg | ORAL_CAPSULE | Freq: Three times a day (TID) | ORAL | Status: DC
  Administered 2024-03-10 – 2024-03-22 (×37): 100 mg via ORAL
  Filled 2024-03-10 (×37): qty 1

## 2024-03-10 MED ORDER — MORPHINE SULFATE (PF) 2 MG/ML IV SOLN
2.0000 mg | INTRAVENOUS | Status: DC | PRN
  Administered 2024-03-10 – 2024-03-14 (×10): 2 mg via INTRAVENOUS
  Filled 2024-03-10 (×11): qty 1
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
## 2024-03-10 NOTE — Plan of Care (Signed)
 Patient more complaint this shift. GHG and foley/peri completed. Pain managed with prescribed scheduled and prn medications. Patient encouraged to cough and deep breath to clear lungs and sputum. Successfully able to do so with small amount of output. Patient also educated on usage of the incentive spirometer , able to successful complete during teach  back.  Problem: Education: Goal: Knowledge of General Education information will improve Description: Including pain rating scale, medication(s)/side effects and non-pharmacologic comfort measures Outcome: Progressing   Problem: Nutrition: Goal: Adequate nutrition will be maintained Outcome: Progressing   Problem: Coping: Goal: Level of anxiety will decrease Outcome: Progressing   Problem: Pain Managment: Goal: General experience of comfort will improve and/or be controlled Outcome: Progressing

## 2024-03-10 NOTE — Progress Notes (Signed)
 During 1800 hour, RN found pt to be in pain rating 8 on a scale 0-10. Pt request pain medication. RN gave pt medication but noticed he wouldn't eat or drink afterwards. RN asked pt to open mouth and pt declined. RN stood at bedside and completed other task while waiting on pt to take sip of water . RN witnessed pt stick hand under bedside quickly. RN pulled bedside table away and found the 2 5mg  oxycodone  lying on top of covers. Pt laughed and declined to take the medication. RN wasted med with charge RN Angelito

## 2024-03-10 NOTE — Progress Notes (Signed)
 Inpatient Rehabilitation Admissions Coordinator   I will place rehab consult to assess for candicacy for Cir admit.  Jeannetta Millman, RN, MSN Rehab Admissions Coordinator [phone] 03/10/2024 5:45 PM
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
## 2024-03-11 NOTE — Plan of Care (Signed)
  Problem: Education: Goal: Knowledge of General Education information will improve Description: Including pain rating scale, medication(s)/side effects and non-pharmacologic comfort measures Outcome: Progressing   Problem: Safety: Goal: Ability to remain free from injury will improve Outcome: Progressing   Problem: Pain Managment: Goal: General experience of comfort will improve and/or be controlled Outcome: Progressing   Problem: Skin Integrity: Goal: Risk for impaired skin integrity will decrease Outcome: Progressing

## 2024-03-11 NOTE — Progress Notes (Signed)
Patient refused am labs. 

## 2024-03-11 NOTE — Progress Notes (Signed)
 Patient is refusing labs and Lovenox .

## 2024-03-11 NOTE — Progress Notes (Signed)
 Inpatient Rehab Admissions Coordinator:    CIR following. Pt. States interest and that his family may be able to assist at d/c. I will reach out to family to confirm support.   Wandalee Gust, MS, CCC-SLP Rehab Admissions Coordinator  [phone] (celll) [phone] (office)

## 2024-03-11 NOTE — Progress Notes (Signed)
 Transition of Care [facility]) - CAGE-AID Screening   Patient Details  Name: [name] MRN: [id number] Date of Birth: 08/12/1996  Transition of [facility]) CM/SW Contact:    Marvis Sluder, RN Phone Number: 03/11/2024, 8:35 PM   Clinical Narrative: Reports alcohol use, crack cocaine, black and milds. Does not provide specifics regarding how often he drinks, smokes etc. Declines resources states he wants to go home.   CAGE-AID Screening: Substance Abuse Screening unable to be completed due to: : Patient unable to participate (disoriented)  Have You Ever Felt You Ought to Cut Down on Your Drinking or Drug Use?: No (reports crack cocaine and alcohol use, does not provide detailed information r/t amount consumed, last use date) Have People Annoyed You By Critizing Your Drinking Or Drug Use?: No Have You Felt Bad Or Guilty About Your Drinking Or Drug Use?: No Have You Ever Had a Drink or Used Drugs First Thing In The Morning to Steady Your Nerves or to Get Rid of a Hangover?: No CAGE-AID Score: 0  Substance Abuse Education Offered: Yes (declined resources, repeatedly states he wants to go home)
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
## 2024-03-11 NOTE — Progress Notes (Signed)
 Inpatient Rehab Admissions Coordinator:    I have attempted to call Pt.'s aunt, both at the number listed in his chart and at some numbers given to me by the Pt. And they appear disconnected. I also attempted to contact his mother and sister at phone numbers listed in his old chart to be merged. Those numbers also are incorrect. So far, I have not been able to confirm caregiver support for after CIR, but I will continue to work on this. If unable to confirm a dispo, Pt. Will need SNF.   Wandalee Gust, MS, CCC-SLP Rehab Admissions Coordinator  [phone] (celll) [phone] (office)
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
## 2024-03-11 NOTE — Progress Notes (Signed)
Patient refused to have his labs drawn.
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
## 2024-03-12 NOTE — Plan of Care (Signed)
   Problem: Coping: Goal: Level of anxiety will decrease Outcome: Progressing
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
## 2024-03-12 NOTE — Progress Notes (Signed)
   Patient requesting to get out of bed with walker. Patient safety education attempted, but was refused, stating "just get out of here". Approximately 0500 patient began to yelling loudly, attempting to get out of bed. Threw bp cuff and fan across the room. Patient complained of lower abdominal discomfort, when offered interventions patient refused. Due to patient behavior, this RN felt it necessary to contact security  to be present during medication administration. Patient refused Lovenox , labs, SCDs.

## 2024-03-12 NOTE — Progress Notes (Signed)
 Inpatient Rehab Admissions Coordinator:  Saw pt and mother at bedside. Discussed CIR goals and expectations with mother. Mother acknowledged understanding and she is supportive of pt pursuing CIR. She acknowledged that family needs to discuss if they will be able to provide 24/7 support for pt after discharge. Rice Chamorro will follow up with pt/family.   Artemus Larsen, MS, CCC-SLP Admissions Coordinator [phone]

## 2024-03-12 NOTE — Progress Notes (Signed)
 Mobility Specialist Progress Note:    03/12/24 1200  Mobility  Activity Transferred from chair to bed  Level of Assistance +2 (takes two people)  Assistive Device MaxiMove  RLE Weight Bearing Per Provider Order NWB  LLE Weight Bearing Per Provider Order NWB  Activity Response Tolerated fair  Mobility Specialist Start Time (ACUTE ONLY) 1223  Mobility Specialist Stop Time (ACUTE ONLY) 1230  Mobility Specialist Time Calculation (min) (ACUTE ONLY) 7 min   Pt received in chair, requesting assistance back to bed. Lifted back to bed w/ TotalA+3. C/o LLE pain throughout. Pt left in bed w/ RN and NT present.  D'Vante Nolon Baxter Mobility Specialist Please contact via Special educational needs teacher or Rehab office at [phone]

## 2024-03-12 NOTE — Progress Notes (Signed)
 At the beginning of the shift, the patient became impulsive, verbally abusive, and non-compliant with safety protocols and medical treatment. Despite the RN's explanation of a physician's order for non-weight bearing (NWB) status and the potential for worsening his condition, the patient repeatedly attempted to get out of bed. He was shouting loudly and did not respond to therapeutic communication techniques. Although the patient reported experiencing pain, he refused to take any pain medication. Due to concerns for the safety of both the patient and staff, security was called to assist. After multiple attempts to de-escalate the situation, the patient eventually agreed to take a portion of his prescribed medications, including Haldol  for agitation. The RN educated the patient on the risks associated with non-compliance, but the patient continued to refuse some medications. The patient's agitation decreased following administration of Haldol . The day shift RN also reported similar behaviors of verbal aggression and medication non-compliance.

## 2024-03-13 ENCOUNTER — Inpatient Hospital Stay (HOSPITAL_COMMUNITY)

## 2024-03-13 DIAGNOSIS — R609 Edema, unspecified: Secondary | ICD-10-CM | POA: Diagnosis not present

## 2024-03-13 MED ORDER — APIXABAN 2.5 MG PO TABS
2.5000 mg | ORAL_TABLET | Freq: Two times a day (BID) | ORAL | Status: DC
  Administered 2024-03-13 – 2024-03-22 (×17): 2.5 mg via ORAL
  Filled 2024-03-13 (×18): qty 1

## 2024-03-13 NOTE — Discharge Instructions (Addendum)
 Information on my medicine - ELIQUIS (apixaban)  This medication education was reviewed with me or my healthcare representative as part of my discharge preparation.    Why was Eliquis prescribed for you? Eliquis was prescribed for you to reduce the risk of blood clots forming after orthopedic surgery.    What do You need to know about Eliquis? Take your Eliquis TWICE DAILY - one tablet in the morning and one tablet in the evening with or without food.  It would be best to take the dose about the same time each day.  If you have difficulty swallowing the tablet whole please discuss with your pharmacist how to take the medication safely.  Take Eliquis exactly as prescribed by your doctor and DO NOT stop taking Eliquis without talking to the doctor who prescribed the medication.  Stopping without other medication to take the place of Eliquis may increase your risk of developing a clot.  After discharge, you should have regular check-up appointments with your healthcare provider that is prescribing your Eliquis.  What do you do if you miss a dose? If a dose of ELIQUIS is not taken at the scheduled time, take it as soon as possible on the same day and twice-daily administration should be resumed.  The dose should not be doubled to make up for a missed dose.  Do not take more than one tablet of ELIQUIS at the same time.  Important Safety Information A possible side effect of Eliquis is bleeding. You should call your healthcare provider right away if you experience any of the following: Bleeding from an injury or your nose that does not stop. Unusual colored urine (red or dark brown) or unusual colored stools (red or black). Unusual bruising for unknown reasons. A serious fall or if you hit your head (even if there is no bleeding).  Some medicines may interact with Eliquis and might increase your risk of bleeding or clotting while on Eliquis. To help avoid this, consult your  healthcare provider or pharmacist prior to using any new prescription or non-prescription medications, including herbals, vitamins, non-steroidal anti-inflammatory drugs (NSAIDs) and supplements.  This website has more information on Eliquis (apixaban): http://www.eliquis.com/eliquis/home    Orthopaedic Trauma Service Discharge Instructions   General Discharge Instructions  Orthopaedic Injuries:  Complex pelvic ring injury treated with open reduction internal fixation of pelvis, ORIF of right acetabulum and sacroiliac screw fixation  WEIGHT BEARING STATUS: Nonweightbearing bilateral lower extremities for 6 weeks date of surgery, 03/07/2024.  Slide or lift transfers only  RANGE OF MOTION/ACTIVITY: No motion restrictions bilateral lower extremities.  Bone health:   Review the following resource for additional information regarding bone health  [email or web address].com.cy  Wound Care:   Discharge Wound Care Instructions  Do NOT apply any ointments, solutions or lotions to pin sites or surgical wounds.  These prevent needed drainage and even though solutions like hydrogen peroxide kill bacteria, they also damage cells lining the pin sites that help fight infection.  Applying lotions or ointments can keep the wounds moist and can cause them to breakdown and open up as well. This can increase the risk for infection. When in doubt call the office.  Surgical incisions should be dressed daily.  If any drainage is noted, use one layer of adaptic or Mepitel, then gauze, and tape.  Alternatively you can use a silicone foam dressing such as a Mepilex  NetCamper.cz [email or web address]://dennis-soto.com/?pd_rd_i=[email or web address]&th=1  http://rojas.com/  These dressing supplies should be available at local  medical supply stores ([employer],  [facility], etc). They are not usually carried at places like [facility], [facility], [facility], etc  Once the incision is completely dry and without drainage, it may be left open to air out.  Showering may begin 36-48 hours later.  Cleaning gently with soap and water .   DVT/PE prophylaxis: Eliquis 2.5 mg p.o. twice daily for 30 days for blood clot prevention  Diet: as you were eating previously.  Can use over the counter stool softeners and bowel preparations, such as Miralax , to help with bowel movements.  Narcotics can be constipating.  Be sure to drink plenty of fluids  PAIN MEDICATION USE AND EXPECTATIONS  You have likely been given narcotic medications to help control your pain.  After a traumatic event that results in an fracture (broken bone) with or without surgery, it is ok to use narcotic pain medications to help control one's pain.  We understand that everyone responds to pain differently and each individual patient will be evaluated on a regular basis for the continued need for narcotic medications. Ideally, narcotic medication use should last no more than 6-8 weeks (coinciding with fracture healing).   As a patient it is your responsibility as well to monitor narcotic medication use and report the amount and frequency you use these medications when you come to your office visit.   We would also advise that if you are using narcotic medications, you should take a dose prior to therapy to maximize you participation.  IF YOU ARE ON NARCOTIC MEDICATIONS IT IS NOT PERMISSIBLE TO OPERATE A MOTOR VEHICLE (MOTORCYCLE/CAR/TRUCK/MOPED) OR HEAVY MACHINERY DO NOT MIX NARCOTICS WITH OTHER CNS (CENTRAL NERVOUS SYSTEM) DEPRESSANTS SUCH AS ALCOHOL   POST-OPERATIVE OPIOID TAPER INSTRUCTIONS: It is important to wean off of your opioid medication as soon as possible. If you do not need pain medication after your surgery it is ok to stop day one. Opioids  include: Codeine, Hydrocodone(Norco, Vicodin), Oxycodone (Percocet, oxycontin ) and hydromorphone  amongst others.  Long term and even short term use of opiods can cause: Increased pain response Dependence Constipation Depression Respiratory depression And more.  Withdrawal symptoms can include Flu like symptoms Nausea, vomiting And more Techniques to manage these symptoms Hydrate well Eat regular healthy meals Stay active Use relaxation techniques(deep breathing, meditating, yoga) Do Not substitute Alcohol to help with tapering If you have been on opioids for less than two weeks and do not have pain than it is ok to stop all together.  Plan to wean off of opioids This plan should start within one week post op of your fracture surgery  Maintain the same interval or time between taking each dose and first decrease the dose.  Cut the total daily intake of opioids by one tablet each day Next start to increase the time between doses. The last dose that should be eliminated is the evening dose.    STOP SMOKING OR USING NICOTINE PRODUCTS!!!!  As discussed nicotine severely impairs your body's ability to heal surgical and traumatic wounds but also impairs bone healing.  Wounds and bone heal by forming microscopic blood vessels (angiogenesis) and nicotine is a vasoconstrictor (essentially, shrinks blood vessels).  Therefore, if vasoconstriction occurs to these microscopic blood vessels they essentially disappear and are unable to deliver necessary nutrients to the healing tissue.  This is one modifiable factor that you can do to dramatically increase your chances of healing your injury.    (This means no smoking, no nicotine gum, patches, etc)  DO NOT USE NONSTEROIDAL ANTI-INFLAMMATORY DRUGS (NSAID'S)  Using products such as Advil (ibuprofen), Aleve (naproxen), Motrin (ibuprofen) for additional pain control during fracture healing can delay and/or prevent the healing response.  If you would  like to take over the counter (OTC) medication, Tylenol  (acetaminophen ) is ok.  However, some narcotic medications that are given for pain control contain acetaminophen  as well. Therefore, you should not exceed more than 4000 mg of tylenol  in a day if you do not have liver disease.  Also note that there are may OTC medicines, such as cold medicines and allergy medicines that my contain tylenol  as well.  If you have any questions about medications and/or interactions please ask your doctor/PA or your pharmacist.      ICE AND ELEVATE INJURED/OPERATIVE EXTREMITY  Using ice and elevating the injured extremity above your heart can help with swelling and pain control.  Icing in a pulsatile fashion, such as 20 minutes on and 20 minutes off, can be followed.    Do not place ice directly on skin. Make sure there is a barrier between to skin and the ice pack.    Using frozen items such as frozen peas works well as the conform nicely to the are that needs to be iced.  USE AN ACE WRAP OR TED HOSE FOR SWELLING CONTROL  In addition to icing and elevation, Ace wraps or TED hose are used to help limit and resolve swelling.  It is recommended to use Ace wraps or TED hose until you are informed to stop.    When using Ace Wraps start the wrapping distally (farthest away from the body) and wrap proximally (closer to the body)   Example: If you had surgery on your leg and you do not have a splint on, start the ace wrap at the toes and work your way up to the thigh        If you had surgery on your upper extremity and do not have a splint on, start the ace wrap at your fingers and work your way up to the upper arm  IF YOU ARE IN A SPLINT OR CAST DO NOT REMOVE IT FOR ANY REASON   If your splint gets wet for any reason please contact the office immediately. You may shower in your splint or cast as long as you keep it dry.  This can be done by wrapping in a cast cover or garbage back (or similar)  Do Not stick any thing down  your splint or cast such as pencils, money, or hangers to try and scratch yourself with.  If you feel itchy take benadryl as prescribed on the bottle for itching  IF YOU ARE IN A CAM BOOT (BLACK BOOT)  You may remove boot periodically. Perform daily dressing changes as noted below.  Wash the liner of the boot regularly and wear a sock when wearing the boot. It is recommended that you sleep in the boot until told otherwise    Call office for the following: Temperature greater than 101F Persistent nausea and vomiting Severe uncontrolled pain Redness, tenderness, or signs of infection (pain, swelling, redness, odor or green/yellow discharge around the site) Difficulty breathing, headache or visual disturbances Hives Persistent dizziness or light-headedness Extreme fatigue Any other questions or concerns you may have after discharge  In an emergency, call 911 or go to an Emergency Department at a nearby hospital  HELPFUL INFORMATION  If you had a block, it will wear off between 8-24 hrs postop typically.  This is period when your  pain may go from nearly zero to the pain you would have had postop without the block.  This is an abrupt transition but nothing dangerous is happening.  You may take an extra dose of narcotic when this happens.  You should wean off your narcotic medicines as soon as you are able.  Most patients will be off or using minimal narcotics before their first postop appointment.   We suggest you use the pain medication the first night prior to going to bed, in order to ease any pain when the anesthesia wears off. You should avoid taking pain medications on an empty stomach as it will make you nauseous.  Do not drink alcoholic beverages or take illicit drugs when taking pain medications.  In most states it is against the law to drive while you are in a splint or sling.  And certainly against the law to drive while taking narcotics.  You may return to work/school in the  next couple of days when you feel up to it.   Pain medication may make you constipated.  Below are a few solutions to try in this order: Decrease the amount of pain medication if you aren't having pain. Drink lots of decaffeinated fluids. Drink prune juice and/or each dried prunes  If the first 3 don't work start with additional solutions Take Colace - an over-the-counter stool softener Take Senokot - an over-the-counter laxative Take Miralax  - a stronger over-the-counter laxative     CALL THE OFFICE WITH ANY QUESTIONS OR CONCERNS: [phone]   VISIT OUR WEBSITE FOR ADDITIONAL INFORMATION: [email or web address]

## 2024-03-13 NOTE — Progress Notes (Signed)
 Mobility Specialist Progress Note:    03/13/24 1200  Mobility  Activity Transferred from bed to chair  Level of Assistance +2 (takes two people)  Assistive Device MaxiMove  RLE Weight Bearing Per Provider Order NWB  LLE Weight Bearing Per Provider Order NWB  Activity Response Tolerated well  Mobility visit 1 Mobility  Mobility Specialist Start Time (ACUTE ONLY) 1142  Mobility Specialist Stop Time (ACUTE ONLY) 1157  Mobility Specialist Time Calculation (min) (ACUTE ONLY) 15 min   Pt received in bed, requesting to get to chair. Lifted to chair w/ maximove and maxA+2. C/o some BLE pain, otherwise asymptomatic. Pt left in chair with call bell and all needs met. RN aware.  D'Vante Nolon Baxter Mobility Specialist Please contact via Special educational needs teacher or Rehab office at [phone]

## 2024-03-13 NOTE — Plan of Care (Signed)
  Problem: Education: Goal: Knowledge of General Education information will improve Description: Including pain rating scale, medication(s)/side effects and non-pharmacologic comfort measures Outcome: Not Progressing   Problem: Health Behavior/Discharge Planning: Goal: Ability to manage health-related needs will improve Outcome: Completed/Met   Problem: Clinical Measurements: Goal: Ability to maintain clinical measurements within normal limits will improve Outcome: Progressing Goal: Will remain free from infection Outcome: Progressing Goal: Diagnostic test results will improve Outcome: Progressing Goal: Respiratory complications will improve Outcome: Progressing Goal: Cardiovascular complication will be avoided Outcome: Progressing   Problem: Activity: Goal: Risk for activity intolerance will decrease Outcome: Progressing   Problem: Nutrition: Goal: Adequate nutrition will be maintained Outcome: Progressing   Problem: Coping: Goal: Level of anxiety will decrease Outcome: Progressing   Problem: Elimination: Goal: Will not experience complications related to bowel motility Outcome: Progressing Goal: Will not experience complications related to urinary retention Outcome: Progressing   Problem: Pain Managment: Goal: General experience of comfort will improve and/or be controlled Outcome: Progressing   Problem: Safety: Goal: Ability to remain free from injury will improve Outcome: Progressing   Problem: Skin Integrity: Goal: Risk for impaired skin integrity will decrease Outcome: Progressing

## 2024-03-13 NOTE — Progress Notes (Signed)
 Bilateral lower extremity venous duplex has been completed. Preliminary results can be found in CV Proc through chart review.   03/13/24 3:05 PM Birda Buffy RVT

## 2024-03-13 NOTE — Progress Notes (Signed)
 6 Days Post-Op  Subjective: CC: No current pain. Tolerating diet without n/v. BM yesterday per patient report. Good uop via foley. Worked with therapies yesterday.   Afebrile. HR 109. No systolic hypotension. On RA. No recent labs (refusing labs previously)   Objective: Vital signs in last 24 hours: Temp:  [98.1 F (36.7 C)-99.3 F (37.4 C)] 99.3 F (37.4 C) (05/31 0744) Pulse Rate:  [87-109] 109 (05/31 0744) Resp:  [15-18] 15 (05/31 0744) BP: (110-135)/(66-75) 118/75 (05/31 0744) SpO2:  [87 %-100 %] 100 % (05/31 0744) Last BM Date :  (Unable to tell)  Intake/Output from previous day: 05/30 0701 - 05/31 0700 In: 600 [P.O.:600] Out: 2900 [Urine:2900] Intake/Output this shift: Total I/O In: 150 [P.O.:150] Out: -   PE: Gen:  Alert, NAD, pleasant Card:  Reg  Pulm:  CTAB, no W/R/R, effort normal Abd: Soft, ND, NT GU: Foley in place with more clear yellow urine.  Ext: RLE edema noted and b/l calf ttp without erythema or palpable cords.  Psych: A&Ox3   Lab Results:  No results for input(s): "WBC", "HGB", "HCT", "PLT" in the last 72 hours.  BMET No results for input(s): "NA", "K", "CL", "CO2", "GLUCOSE", "BUN", "CREATININE", "CALCIUM " in the last 72 hours.  PT/INR No results for input(s): "LABPROT", "INR" in the last 72 hours. CMP     Component Value Date/Time   NA 134 (L) 03/07/2024 1641   K 5.1 03/07/2024 1641   CL 108 03/06/2024 0555   CO2 19 (L) 03/06/2024 0555   GLUCOSE 104 (H) 03/06/2024 0555   BUN 21 (H) 03/06/2024 0555   CREATININE 1.22 03/06/2024 0555   CALCIUM  8.8 (L) 03/06/2024 0555   PROT 6.5 03/05/2024 1522   ALBUMIN  3.6 03/05/2024 1522   AST 63 (H) 03/05/2024 1522   ALT 28 03/05/2024 1522   ALKPHOS 48 03/05/2024 1522   BILITOT 0.5 03/05/2024 1522   GFRNONAA >60 03/06/2024 0555   Lipase  No results found for: "LIPASE"  Studies/Results: No results found.   Anti-infectives: Anti-infectives (From admission, onward)    Start      Dose/Rate Route Frequency Ordered Stop   03/07/24 2200  ceFAZolin  (ANCEF ) IVPB 2g/100 mL premix        2 g 200 mL/hr over 30 Minutes Intravenous Every 8 hours 03/07/24 1942 03/08/24 1358   03/07/24 1556  vancomycin  (VANCOCIN ) powder  Status:  Discontinued          As needed 03/07/24 1557 03/07/24 1749   03/07/24 1200  ceFAZolin  (ANCEF ) IVPB 2g/100 mL premix        2 g 200 mL/hr over 30 Minutes Intravenous On call to O.R. 03/07/24 0926 03/07/24 1320   03/05/24 1630  ceFAZolin  (ANCEF ) IVPB 2g/100 mL premix        2 g 200 mL/hr over 30 Minutes Intravenous  Once 03/05/24 1616 03/05/24 1640        Assessment/Plan Ped struck   Open book pelvis - OR 5/25 with Dr. Guyann Leitz for ORIF - NWB BLE for 6 weeks. May chair slide or lift to chair with PT. Therapies. 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  Extraperitoneal bladder injury and scrotal hematoma - repaired in OR by urology Dr. Secundino Dach 5/25. Cont foley for now.  Per urology Foley catheter to stay in place for approximately 2 weeks.  Cystogram around June 8 and can remove Foley catheter if no extravasation is noted. H/o schizophrenia - psych restarted abilify  H/o SUD, cannabis - TOC c/s  L distal fibula avulsion fx - no intervention or bracing per ortho Abdominal pain - suspect this was secondary to urine in his abdomen.  Plain film reassuring 5/27. Pain improved. Cont to monitor.  FEN - regular diet, bowel regimen DVT - SCDs, refusing lovenox . Will switch to po eliquis per ortho recs. LE US  today to r/o DVT ID - ancef  5/23 - 5/26. None currently.  Foley - per urology Dispo - floor, therapies, CIR. Medically stable for d/c to CIR. Family confirms 24/7 support between his Mom, brother and sister   Contact numbers Mom [name]) - [phone] [name] (Brother) - [phone] Perla Bradford (sister) - [phone]  I reviewed nursing notes, Consultant (ortho, urology) notes, last 24 h vitals and pain scores, last 48 h intake and output, last 24 h  labs and trends, and last 24 h imaging results    LOS: 8 days    Delton Filbert, [facility] 03/13/2024, 10:27 AM Please see Amion for pager number during day hours 7:00am-4:30pm

## 2024-03-13 NOTE — Progress Notes (Addendum)
 Pt complaints of pain in the penile area pain score of 10/10, this RN assessed the area and noted a significant swelling of the area. Foley is in, urine output of . [facility] was called in and will assess the pt in 20-30 mins. Pain medication is given awaiting for urology's recommendation  1740 Pt and family said he was already seen by Urology.  4098 Dependent edema in the penile area, no intervention needed. Pt refused to elevate the area with washcloth.

## 2024-03-14 ENCOUNTER — Inpatient Hospital Stay (HOSPITAL_COMMUNITY)

## 2024-03-14 NOTE — Progress Notes (Signed)
 This RN saw the pt from the window, was standing alone in the room holding on the table. Pt went to bathroom by himself, had shower and BM. Bed alarm was off, no idea who turned the bed alarm off. Pt assisted to bed. Pt was informed again that he is not allowed to put weight on BLE, to ask help if needed. Before this RN left, bed alarm was  on the lowest position, call bell at reach.
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
## 2024-03-14 NOTE — Progress Notes (Signed)
 Mobility Specialist Progress Note:    03/14/24 1049  Mobility  Activity Transferred from bed to chair  Level of Assistance +2 (takes two people)  Assistive Device None  RLE Weight Bearing Per Provider Order NWB  LLE Weight Bearing Per Provider Order NWB  Activity Response Tolerated well  Mobility Referral Yes (Assist therapies and Nsg with transfers; Maximove, lateral scoot, ant-post)  Mobility visit 1 Mobility  Mobility Specialist Start Time (ACUTE ONLY) 1022  Mobility Specialist Stop Time (ACUTE ONLY) 1032  Mobility Specialist Time Calculation (min) (ACUTE ONLY) 10 min   Pt received in bed, requesting assistance to chair. Required modA+2 for bed mobility. Performed lateral scoot to chair w/ modA. No complaints throughout scoot. Pt left in chair with call bell and all needs met.  D'Vante Nolon Baxter Mobility Specialist Please contact via Special educational needs teacher or Rehab office at [phone]

## 2024-03-15 ENCOUNTER — Other Ambulatory Visit (HOSPITAL_COMMUNITY): Payer: Self-pay

## 2024-03-15 ENCOUNTER — Telehealth (HOSPITAL_COMMUNITY): Payer: Self-pay | Admitting: Pharmacy Technician

## 2024-03-15 MED ORDER — IBUPROFEN 200 MG PO TABS: 600.0000 mg | ORAL_TABLET | Freq: Four times a day (QID) | ORAL | Status: DC

## 2024-03-15 MED ORDER — LIDOCAINE 5 % EX PTCH
2.0000 | MEDICATED_PATCH | CUTANEOUS | Status: DC
  Administered 2024-03-15 – 2024-03-22 (×8): 2 via TRANSDERMAL
  Filled 2024-03-15 (×8): qty 2

## 2024-03-15 MED ORDER — MORPHINE SULFATE (PF) 2 MG/ML IV SOLN
2.0000 mg | Freq: Two times a day (BID) | INTRAVENOUS | Status: DC | PRN
  Administered 2024-03-15 – 2024-03-17 (×3): 2 mg via INTRAVENOUS
  Filled 2024-03-15 (×4): qty 1
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
## 2024-03-15 NOTE — NC FL2 (Signed)
 [place]  MEDICAID FL2 LEVEL OF CARE FORM     IDENTIFICATION  Patient Name: [name] Birthdate: 13-Jun-1996 Sex: male Admission Date (Current Location): 03/05/2024  [facility] and [place] Number:  Producer, television/film/video and Address:  [facility] [place], [address], [place], [place] [address]      Provider Number: [id number]  Attending Physician Name and Address:  Md, Trauma, MD   - Dr. Aniceto Barley Relative Name and Phone Number:  Angelica, Wix (Mother)  [phone] (Mobile)    Current Level of Care: Hospital Recommended Level of Care: Skilled Nursing Facility Prior Approval Number:    Date Approved/Denied:   PASRR Number: pending  Discharge Plan:      Current Diagnoses: Patient Active Problem List   Diagnosis Date Noted   Extraperitoneal rupture of bladder 03/08/2024   Schizoaffective disorder, unspecified (HCC) 03/06/2024   Pelvic fracture (HCC) 03/05/2024    Orientation RESPIRATION BLADDER Height & Weight     Self, Situation, Place  Normal External catheter Weight: 177 lb 11.1 oz (80.6 kg) Height:  6\' 2"  (188 cm)  BEHAVIORAL SYMPTOMS/MOOD NEUROLOGICAL BOWEL NUTRITION STATUS      Continent Diet (regular)  AMBULATORY STATUS COMMUNICATION OF NEEDS Skin   Extensive Assist Verbally Skin abrasions, Surgical wounds (R abdomen - foam dressing)                       Personal Care Assistance Level of Assistance  Bathing, Feeding, Dressing Bathing Assistance: Maximum assistance Feeding assistance: Limited assistance Dressing Assistance: Maximum assistance     Functional Limitations Info             SPECIAL CARE FACTORS FREQUENCY  PT (By licensed PT), OT (By licensed OT)     PT Frequency: 5 times per week OT Frequency: 5 times per week            Contractures      Additional Factors Info  Code Status, Allergies Code Status Info: full Allergies Info: Morphine  And Codeine           Current Medications (03/15/2024):  This is the  current hospital active medication list Current Facility-Administered Medications  Medication Dose Route Frequency Provider Last Rate Last Admin   acetaminophen  (TYLENOL ) tablet 1,000 mg  1,000 mg Oral Q6H Marisela Sicks, PA-C   1,000 mg at 03/15/24 1154   apixaban (ELIQUIS) tablet 2.5 mg  2.5 mg Oral BID Maczis, Michael M, PA-C   2.5 mg at 03/15/24 0915   ARIPiprazole  (ABILIFY ) tablet 5 mg  5 mg Oral Daily Starkes-Perry, Takia S, FNP   5 mg at 03/15/24 0915   Chlorhexidine  Gluconate Cloth 2 % PADS 6 each  6 each Topical Daily Marisela Sicks, PA-C   6 each at 03/15/24 1000   docusate sodium  (COLACE) capsule 100 mg  100 mg Oral BID Marisela Sicks, PA-C   100 mg at 03/15/24 0915   haloperidol  lactate (HALDOL ) injection 10 mg  10 mg Intravenous Q6H PRN Marisela Sicks, PA-C   10 mg at 03/15/24 1154   Or   haloperidol  lactate (HALDOL ) injection 10 mg  10 mg Intramuscular Q6H PRN Marisela Sicks, PA-C       hydrALAZINE  (APRESOLINE ) injection 10 mg  10 mg Intravenous Q2H PRN Marisela Sicks, PA-C       lidocaine  (LIDODERM ) 5 % 2 patch  2 patch Transdermal Q24H Anda Bamberg, MD   2 patch at 03/15/24 1155   methocarbamol  (ROBAXIN ) tablet 1,000 mg  1,000 mg Oral QID Marlin Simmonds, PA-C   1,000 mg at 03/15/24 4098   metoprolol  tartrate (LOPRESSOR ) injection 5 mg  5 mg Intravenous Q6H PRN Marisela Sicks, PA-C       morphine  (PF) 2 MG/ML injection 2 mg  2 mg Intravenous Q12H PRN Anda Bamberg, MD       ondansetron  (ZOFRAN -ODT) disintegrating tablet 4 mg  4 mg Oral Q6H PRN Marisela Sicks, PA-C       Or   ondansetron  (ZOFRAN ) injection 4 mg  4 mg Intravenous Q6H PRN Marisela Sicks, PA-C       oxyCODONE  (Oxy IR/ROXICODONE ) immediate release tablet 5-10 mg  5-10 mg Oral Q4H PRN Marisela Sicks, PA-C   10 mg at 03/15/24 1154   polyethylene glycol (MIRALAX  / GLYCOLAX ) packet 17 g  17 g Oral Daily Marlin Simmonds, PA-C   17 g at 03/15/24 1191   pregabalin  (LYRICA ) capsule 100 mg  100 mg Oral TID Maczis, Michael M, PA-C   100 mg at 03/15/24  [id number]     Discharge Medications: Please see discharge summary for a list of discharge medications.  Relevant Imaging Results:  Relevant Lab Results:   Additional Information SS #: [id number]  Zamyiah Tino E Murle Hellstrom, LCSW

## 2024-03-15 NOTE — Telephone Encounter (Signed)
 Patient Product/process development scientist completed.    The patient is insured through [employer]. Patient has Medicare and is not eligible for a copay card, but may be able to apply for patient assistance or Medicare RX Payment Plan (Patient Must reach out to their plan, if eligible for payment plan), if available.    Ran test claim for Eliquis 2.5 mg and the current 30 day co-pay is $4.80.   This test claim was processed through [facility]- copay amounts may vary at other pharmacies due to pharmacy/plan contracts, or as the patient moves through the different stages of their insurance plan.     Morgan Arab, CPHT Pharmacy Technician III Certified Patient Advocate [facility] Patient Advocate Team Direct Number: [phone]  Fax: [phone]

## 2024-03-15 NOTE — TOC PASRR Note (Signed)
 RE: [name] Date of Birth:  November 02, 1995 Date:03/15/24   To Whom It May Concern:  Please be advised that the above-named patient will require a short-term nursing home stay - anticipated 30 days or less for rehabilitation and strengthening.  The plan is for return home.

## 2024-03-15 NOTE — Plan of Care (Signed)
   Problem: Education: Goal: Knowledge of General Education information will improve Description Including pain rating scale, medication(s)/side effects and non-pharmacologic comfort measures Outcome: Progressing

## 2024-03-15 NOTE — Progress Notes (Signed)
 Inpatient Rehab Admissions Coordinator:    I spoke with Pt.'s mother at length. She states that she has lost her home and lives with her daughter now. While she states that she would like to be able to care for the patient, she cannot bring him to where she lives now and everyone in the family works and is unable to take time off to be with the patient during the days. She prefers SNF placement for pt. I will let TOC know and CIR will sign off.   Wandalee Gust, MS, CCC-SLP Rehab Admissions Coordinator  [phone] (celll) [phone] (office)

## 2024-03-15 NOTE — TOC Progression Note (Signed)
 Transition of Care [facility]) - Progression Note    Patient Details  Name: [name] MRN: [id number] Date of Birth: 04-18-1996  [facility]) CM/SW Contact  Daria Mcmeekin E Chiamaka Latka, LCSW Phone Number: 03/15/2024, 4:07 PM  Clinical Narrative:    SNF workup started. PASRR is pending.       Expected Discharge Plan and Services                                               Social Determinants of Health (SDOH) Interventions SDOH Screenings   Food Insecurity: Food Insecurity Present (03/07/2024)  Housing: High Risk (03/07/2024)  Transportation Needs: Unmet Transportation Needs (03/07/2024)  Utilities: At Risk (03/07/2024)  Tobacco Use: Low Risk  (03/07/2024)  Recent Concern: Tobacco Use - High Risk (03/07/2024)    Readmission Risk Interventions     No data to display

## 2024-03-16 MED ORDER — POLYETHYLENE GLYCOL 3350 17 G PO PACK
17.0000 g | PACK | Freq: Two times a day (BID) | ORAL | Status: DC
  Administered 2024-03-17: 17 g via ORAL
  Filled 2024-03-16 (×2): qty 1

## 2024-03-16 MED ORDER — SENNA 8.6 MG PO TABS
2.0000 | ORAL_TABLET | Freq: Once | ORAL | Status: AC
  Administered 2024-03-16: 17.2 mg via ORAL
  Filled 2024-03-16: qty 2

## 2024-03-16 MED ORDER — BISACODYL 5 MG PO TBEC
10.0000 mg | DELAYED_RELEASE_TABLET | Freq: Once | ORAL | Status: AC
  Administered 2024-03-16: 10 mg via ORAL
  Filled 2024-03-16: qty 2
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
## 2024-03-16 NOTE — TOC Progression Note (Addendum)
 Transition of Care [facility]) - Progression Note    Patient Details  Name: [name] MRN: [id number] Date of Birth: 15-Aug-1996  [facility] CM/SW Contact  Lasaro Primm E Kalilah Barua, LCSW Phone Number: 03/16/2024, 8:54 AM  Clinical Narrative:    Information uploaded into [place] [facility]. PASRR is pending.  11:52- Reached out to Jen with [facility] and asked her to review.  1:20- [facility] unable to accept. PASRR still pending in [place] [facility]. Attempted call to patient's Mother at [phone] to present current bed offers.       Expected Discharge Plan and Services                                               Social Determinants of Health (SDOH) Interventions SDOH Screenings   Food Insecurity: Food Insecurity Present (03/07/2024)  Housing: High Risk (03/07/2024)  Transportation Needs: Unmet Transportation Needs (03/07/2024)  Utilities: At Risk (03/07/2024)  Tobacco Use: Low Risk  (03/07/2024)  Recent Concern: Tobacco Use - High Risk (03/07/2024)    Readmission Risk Interventions     No data to display
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
## 2024-03-17 MED ORDER — BISACODYL 5 MG PO TBEC
10.0000 mg | DELAYED_RELEASE_TABLET | Freq: Once | ORAL | Status: AC
  Administered 2024-03-17: 10 mg via ORAL
  Filled 2024-03-17: qty 2

## 2024-03-17 MED ORDER — MAGNESIUM CITRATE PO SOLN
1.0000 | Freq: Once | ORAL | Status: AC
  Administered 2024-03-17: 1 via ORAL
  Filled 2024-03-17: qty 296

## 2024-03-17 MED ORDER — SENNA 8.6 MG PO TABS
2.0000 | ORAL_TABLET | Freq: Once | ORAL | Status: AC
  Administered 2024-03-17: 17.2 mg via ORAL
  Filled 2024-03-17: qty 2

## 2024-03-17 MED ORDER — BISACODYL 10 MG RE SUPP
10.0000 mg | Freq: Once | RECTAL | Status: DC
  Filled 2024-03-17: qty 1

## 2024-03-17 MED ORDER — POLYETHYLENE GLYCOL 3350 17 G PO PACK
17.0000 g | PACK | Freq: Three times a day (TID) | ORAL | Status: DC
  Administered 2024-03-17 – 2024-03-18 (×5): 17 g via ORAL
  Filled 2024-03-17 (×9): qty 1

## 2024-03-17 MED ORDER — MAGNESIUM HYDROXIDE 400 MG/5ML PO SUSP
30.0000 mL | Freq: Once | ORAL | Status: AC
  Administered 2024-03-17: 30 mL via ORAL
  Filled 2024-03-17: qty 30

## 2024-03-17 NOTE — TOC Progression Note (Signed)
 Transition of Care [facility]) - Progression Note    Patient Details  Name: [name] MRN: [id number] Date of Birth: 06-03-1996  Transition of Care [facility]) CM/SW Contact  Elspeth Hals, LCSW Phone Number: 03/17/2024, 12:12 PM  Clinical Narrative:   CSW spoke with pt mother, [name], regarding bed offers.  She would like to accept offer at [facility].  Per [facility], bed available tomorrow.    MD/trauma CSW informed.   1600: message from [facility]: after additional review, they are unable to accept this pt, withdrawing their bed offer.           Expected Discharge Plan and Services                                               Social Determinants of Health (SDOH) Interventions SDOH Screenings   Food Insecurity: Food Insecurity Present (03/07/2024)  Housing: High Risk (03/07/2024)  Transportation Needs: Unmet Transportation Needs (03/07/2024)  Utilities: At Risk (03/07/2024)  Tobacco Use: Low Risk  (03/07/2024)  Recent Concern: Tobacco Use - High Risk (03/07/2024)    Readmission Risk Interventions     No data to display
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
## 2024-03-17 NOTE — Progress Notes (Signed)
 This patient was assisted into the chair via lateral scooting.  Patient was last seen sitting in chair with call light and phone in reach.  A few minutes later I was leaving another patient's room and heard the chair exit alarm going off.  This RN went to the room and found the patient standing, dragging his foley on the floor.  This RN helped the patient back to bed.  When I asked what happened he stated that "he had been calling for 3 hours" even though he had only been in the chair a total of 10 minutes.  This RN explained his NWB status and explained that why it was important not to walk at this time.  I warned him that if he continued to try to get up without staff that we would have to get a sitter or a tele sitter for his safety.  Patient acknowledged and appologized. Will inform next shift and continue to monitor.

## 2024-03-18 ENCOUNTER — Other Ambulatory Visit (HOSPITAL_COMMUNITY): Payer: Self-pay

## 2024-03-18 MED ORDER — HALOPERIDOL LACTATE 5 MG/ML IJ SOLN
5.0000 mg | Freq: Four times a day (QID) | INTRAMUSCULAR | Status: DC | PRN
  Administered 2024-03-18: 5 mg via INTRAVENOUS
  Filled 2024-03-18 (×2): qty 1

## 2024-03-18 MED ORDER — ACETAMINOPHEN 500 MG PO TABS
1000.0000 mg | ORAL_TABLET | Freq: Four times a day (QID) | ORAL | 0 refills | Status: DC
  Filled 2024-03-18: qty 30, 4d supply, fill #0

## 2024-03-18 MED ORDER — OXYCODONE HCL 5 MG PO TABS: 5.0000 mg | ORAL_TABLET | ORAL | 0 refills | Status: AC | PRN

## 2024-03-18 MED ORDER — PREGABALIN 100 MG PO CAPS: 100.0000 mg | ORAL_CAPSULE | Freq: Three times a day (TID) | ORAL | 0 refills | Status: AC

## 2024-03-18 MED ORDER — APIXABAN 2.5 MG PO TABS
2.5000 mg | ORAL_TABLET | Freq: Two times a day (BID) | ORAL | 0 refills | Status: AC
Start: 2024-03-18 — End: 2024-06-16

## 2024-03-18 MED ORDER — ARIPIPRAZOLE 5 MG PO TABS
5.0000 mg | ORAL_TABLET | Freq: Every day | ORAL | 0 refills | Status: AC
Start: 2024-03-19 — End: 2024-06-17

## 2024-03-18 MED ORDER — METHOCARBAMOL 1000 MG PO TABS
1000.0000 mg | ORAL_TABLET | Freq: Four times a day (QID) | ORAL | 0 refills | Status: AC
Start: 2024-03-18 — End: 2024-04-17

## 2024-03-18 NOTE — Plan of Care (Signed)
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
## 2024-03-18 NOTE — TOC Progression Note (Addendum)
 Transition of Care [facility]) - Progression Note    Patient Details  Name: [name] MRN: [id number] Date of Birth: [birth year]  Transition of Care [facility]) CM/SW Contact  Valinda Fedie E Alyshia Kernan, LCSW Phone Number: 03/18/2024, 8:56 AM  Clinical Narrative:    [name] is still pending. CSW called [facility] - spoke to [place] who states the Level 2 assessment is still pending. Ethelle Herb states she does see where the MD called yesterday and had it "escalated". Ethelle Herb states they will try to get the screening completed as soon as they can, but that it could still take 5 business days.  CSW was also notified [facility] rescinded the bed offer. Called [facility] in Admissions - she states the patient was accepted "by mistake" and they cannot meet his needs.  Called patient's mother [name]. No answer, no VM option. Will keep trying.  11:20- [facility] still pending. Attempted another call to patient's mom again- no answer no VM. Attempted call to patient's brother [name] - left a VM.   12:40- [facility] still pending. Asked RN to notify this CSW if family comes to bedside.  1:00- Return call received from patient's mother. She states the best number to reach her is either her number or patient's sister's number (both in chart). Explained PAS[facility] is pending. Provided current bed offers. She is hesitant to accept and would like to know if the pending SNFs can accept. She is aware she will need to choose from available bed offers when [facility] is back since patient is medically ready.  CSW reached out to the following  -[facility]- [facility] states unable to accept -[facility]- [facility] states unable to accept -[facility] states no beds  -[facility]- Angie accepted  3:10- [name] is back [phone] F Called patient's mother - informed of updated bed offers - [facility], [facility], [facility]. Patient's mother and sister chose [name].  Called Tammy with [name] - she states they can take patient today.  3:40- Call from Tammy who states they  have to rescind the bed offer due to patient being + for cocaine. Checked with MD, patient has not had agitation/disruptive behaviors per MD.  [facility] states they have to rescind the bed offer. Updated Care Team and patient's mother. She wants to try for [facility]. She is agreeable to the bed search being extended for back up options. Called Angie with [facility] -  she will double check in the morning on if they can accept.       Expected Discharge Plan and Services         Expected Discharge Date: 03/18/24                                     Social Determinants of Health (SDOH) Interventions SDOH Screenings   Food Insecurity: Food Insecurity Present (03/07/2024)  Housing: High Risk (03/07/2024)  Transportation Needs: Unmet Transportation Needs (03/07/2024)  Utilities: At Risk (03/07/2024)  Tobacco Use: Low Risk  (03/07/2024)  Recent Concern: Tobacco Use - High Risk (03/07/2024)    Readmission Risk Interventions     No data to display

## 2024-03-19 NOTE — Consult Note (Signed)
 The client refused to go to his rehab.  IVC placed and unfortunately facility transfer will not occur today.  Roslynn Coombes, PMHNP

## 2024-03-19 NOTE — Consult Note (Signed)
 On assessment, the client was sleeping and awakened briefly to report he was "alright" and put a thumbs up under the blanket when inquired about his sleep.  Then, he continued back to sleep.  It appears he will be transferring to a SNF today for rehab, medications in place.  Roslynn Coombes, PMHNP

## 2024-03-19 NOTE — Progress Notes (Addendum)
 IVC filed with Magistrate  Envelope # [id number] Case # [id number] *   Hard copies of Affidavit and First Exam in hard chart.  Care Team notified  Sahara Fujimoto, [place] Transitions of Care Department [phone]

## 2024-03-19 NOTE — TOC Progression Note (Signed)
 Transition of Care [facility]) - Progression Note    Patient Details  Name: [name] MRN: [id number] Date of Birth: 1996/01/12  Transition of Care [facility]) CM/SW Contact  Janard Culp E Annabella Elford, LCSW Phone Number: 03/19/2024, 11:41 AM  Clinical Narrative:    Sent updated notes to Angie at [facility]. She is checking to confirm that they can take patient, if her leadership confirms, they have a bed today.   11:40- Awaiting confirmation from Angie/[facility] leadership. Updated mother by phone.      Barriers to Discharge: Barriers Resolved  Expected Discharge Plan and Services         Expected Discharge Date: 03/18/24                                     Social Determinants of Health (SDOH) Interventions SDOH Screenings   Food Insecurity: Food Insecurity Present (03/07/2024)  Housing: High Risk (03/07/2024)  Transportation Needs: Unmet Transportation Needs (03/07/2024)  Utilities: At Risk (03/07/2024)  Tobacco Use: Low Risk  (03/07/2024)  Recent Concern: Tobacco Use - High Risk (03/07/2024)    Readmission Risk Interventions     No data to display

## 2024-03-19 NOTE — Plan of Care (Signed)

## 2024-03-19 NOTE — Progress Notes (Signed)
 Mobility Specialist Progress Note:    03/19/24 1100  Mobility  Activity Transferred from bed to chair  Level of Assistance Moderate assist, patient does 50-74%  Assistive Device Other (Comment) (bed pad)  RLE Weight Bearing Per Provider Order NWB  LLE Weight Bearing Per Provider Order NWB  Activity Response Tolerated well  Mobility Referral Yes  Mobility visit 1 Mobility  Mobility Specialist Start Time (ACUTE ONLY) 1117  Mobility Specialist Stop Time (ACUTE ONLY) 1130  Mobility Specialist Time Calculation (min) (ACUTE ONLY) 13 min   Pt received in bed, requesting to use bedpan and transfer to chair. Successful BM in bedpan. MS assisted w/ pericare. Pt then able to lateral scoot to chair w/ modA. No complaints throughout. Required cues for BLE NWB status. Pt left in chair with call bell and chair alarm on.  D'Vante Nolon Baxter Mobility Specialist Please contact via Special educational needs teacher or Rehab office at [phone]
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
## 2024-03-19 NOTE — Progress Notes (Signed)
 Mobility Specialist Progress Note:    03/19/24 1200  Mobility  Activity Repositioned in chair (Stand > Sit)  Level of Assistance Minimal assist, patient does 75% or more  Assistive Device Other (Comment) (HHA)  RLE Weight Bearing Per Provider Order NWB (Pt standing in room)  LLE Weight Bearing Per Provider Order NWB  Activity Response RN notified  Mobility Referral Yes  Mobility visit 1 Mobility  Mobility Specialist Start Time (ACUTE ONLY) 1152  Mobility Specialist Stop Time (ACUTE ONLY) 1157  Mobility Specialist Time Calculation (min) (ACUTE ONLY) 5 min   MS entered room to chair alarm sounding and pt standing in room. C/o legs hurting in chair and wanting to get to bed. MS assisted pt back to sitting and reminded pt of BLE NWB status. Pt agreeable to sit and adhere to weight bearing orders. RN notified. Pt left in chair with call bell and bed alarm on.  D'Vante Nolon Baxter Mobility Specialist Please contact via Special educational needs teacher or Rehab office at [phone]

## 2024-03-19 NOTE — TOC Transition Note (Signed)
 Transition of Care [facility]) - Discharge Note   Patient Details  Name: [name] MRN: [id number] Date of Birth: 19-Feb-1996  Transition of Care [facility]) CM/SW Contact:  Smaran Gaus E Gene Colee, LCSW Phone Number: 03/19/2024, 1:19 PM   Clinical Narrative:    Discharge to [facility] today. Room 302. Confirmed with Admissions Worker Angie. Updated MD, RN, and  left message for [name] (mother) and updated Janna Melter (sister). Asked RN to call report. EMS paperwork completed. [facility] states patient must be in their building by 330 pm due to pharmacy reasons --- Sharin David at [facility] able to accommodate this.     Final next level of care: Skilled Nursing Facility Barriers to Discharge: Barriers Resolved   Patient Goals and CMS Choice   CMS Medicare.gov Compare Post Acute Care list provided to:: Patient Represenative (must comment) Choice offered to / list presented to : Sibling, Parent      Discharge Placement              Patient chooses bed at: [facility] Patient to be transferred to facility by: [facility] Name of family member notified: [name] (mother), Janna Melter (sister) Patient and family notified of of transfer: 03/19/24  Discharge Plan and Services Additional resources added to the After Visit Summary for                                       Social Drivers of Health (SDOH) Interventions SDOH Screenings   Food Insecurity: Food Insecurity Present (03/07/2024)  Housing: High Risk (03/07/2024)  Transportation Needs: Unmet Transportation Needs (03/07/2024)  Utilities: At Risk (03/07/2024)  Tobacco Use: Low Risk  (03/07/2024)  Recent Concern: Tobacco Use - High Risk (03/07/2024)     Readmission Risk Interventions     No data to display

## 2024-03-19 NOTE — Progress Notes (Signed)
 PT Cancellation Note  Patient Details Name: Moe Graca MRN: [id number] DOB: [birth year]   Cancelled Treatment:    Reason Eval/Treat Not Completed: (P) Other (comment) (Pt noted to be walking down the hallway yelling at family and staff. Therapy not appropriate at this time, will follow up as able.)   Michaelle Adolphus 03/19/2024, 4:32 PM
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
## 2024-03-20 DIAGNOSIS — S32431S Displaced fracture of anterior column [iliopubic] of right acetabulum, sequela: Secondary | ICD-10-CM

## 2024-03-20 NOTE — Progress Notes (Signed)
 0800 patient alert able to make all needs known  1000 meds given PO patient did not want miralax   1200 patient ambulated to bathroom to have Bowel movement education on NWB status and complications PRN pain Meds given  1545 PRN pain meds given

## 2024-03-20 NOTE — Progress Notes (Addendum)
 Patient was compliant with meds and was able to answer Name and DOB, knew he was in the hospital and knew the plan was for him to go to a facility.  Was not able to tell me the correct date.    During the night patient called out for a "Happy Meal", thought his fan and graham crackers had feet.    This morning he was up in the bathroom giving himself a shower and washing his head when I did morning rounds.  I called the CNA to come stay with him, due to high fall risk.  Patient did yell out when back in bed that he needed pain medication and was requesting anesthesia.  10mg  of Oxycodone  was given for 9/10 leg pain and patient quit yelling immediately.
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
## 2024-03-20 NOTE — Plan of Care (Signed)
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
## 2024-03-20 NOTE — Plan of Care (Addendum)
  Problem: Education: Goal: Knowledge of General Education information will improve Description: Including pain rating scale, medication(s)/side effects and non-pharmacologic comfort measures Outcome: Progressing   Problem: Clinical Measurements: Goal: Ability to maintain clinical measurements within normal limits will improve Outcome: Progressing Goal: Will remain free from infection Outcome: Progressing Goal: Diagnostic test results will improve Outcome: Progressing Goal: Respiratory complications will improve Outcome: Progressing Goal: Cardiovascular complication will be avoided Outcome: Progressing   Problem: Activity: Goal: Risk for activity intolerance will decrease Outcome: Progressing   Problem: Nutrition: Goal: Adequate nutrition will be maintained Outcome: Progressing   Problem: Coping: Goal: Level of anxiety will decrease Outcome: Progressing   Problem: Elimination: Goal: Will not experience complications related to bowel motility Outcome: Progressing Goal: Will not experience complications related to urinary retention Outcome: Progressing   Problem: Pain Managment: Goal: General experience of comfort will improve and/or be controlled Outcome: Progressing   Problem: Safety: Goal: Ability to remain free from injury will improve Outcome: Not Progressing Note: Patient will walk around against medical advice   Problem: Skin Integrity: Goal: Risk for impaired skin integrity will decrease Outcome: Progressing

## 2024-03-21 ENCOUNTER — Inpatient Hospital Stay (HOSPITAL_COMMUNITY)

## 2024-03-21 LAB — BASIC METABOLIC PANEL WITH GFR
Anion gap: 10 (ref 5–15)
BUN: 19 mg/dL (ref 6–20)
CO2: 25 mmol/L (ref 22–32)
Calcium: 8.9 mg/dL (ref 8.9–10.3)
Chloride: 101 mmol/L (ref 98–111)
Creatinine, Ser: 0.8 mg/dL (ref 0.61–1.24)
GFR, Estimated: >60 mL/min (ref >60)
Glucose, Bld: 106 mg/dL — ABNORMAL HIGH (ref 70–99)
Potassium: 4.2 mmol/L (ref 3.5–5.1)
Sodium: 136 mmol/L (ref 135–145)

## 2024-03-21 MED ORDER — CHLORHEXIDINE GLUCONATE CLOTH 2 % EX PADS
6.0000 | MEDICATED_PAD | Freq: Every day | CUTANEOUS | Status: DC
  Administered 2024-03-21 – 2024-03-22 (×2): 6 via TOPICAL

## 2024-03-21 MED ORDER — IOHEXOL 180 MG/ML  SOLN
200.0000 mL | Freq: Once | INTRAMUSCULAR | Status: AC | PRN
  Administered 2024-03-21: 200 mL

## 2024-03-21 NOTE — Plan of Care (Signed)

## 2024-03-21 NOTE — Plan of Care (Signed)
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
## 2024-03-22 MED ORDER — HYDROMORPHONE HCL 1 MG/ML IJ SOLN
0.5000 mg | Freq: Once | INTRAMUSCULAR | Status: AC
  Administered 2024-03-22: 0.5 mg via INTRAVENOUS
  Filled 2024-03-22: qty 0.5

## 2024-03-22 MED ORDER — LORAZEPAM 2 MG/ML IJ SOLN
1.0000 mg | Freq: Once | INTRAMUSCULAR | Status: DC
  Filled 2024-03-22: qty 1
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
## 2024-03-22 NOTE — TOC Progression Note (Addendum)
 Transition of Care [facility]) - Progression Note    Patient Details  Name: [name] MRN: [id number] Date of Birth: 11/25/1995  [facility] CM/SW Contact  Kimberlee Shoun E Vidur Knust, LCSW Phone Number: 03/22/2024, 10:24 AM  Clinical Narrative:    CSW called Angie with [facility]. Explained that patient is medically ready for SNF, patient hesitant to agree to SNF but Psych does not feel patient has capacity to refuse SNF per PA Bambi Lever discussion on Friday with NP Lynnda Sas. Sent updated notes from weekend and [facility] to Angie - she is going to review them with her clinical team and follow up on if they can take patient today. IVC would need to be rescinded.    1:00- [facility] SNF has rescinded their bed offer due to "patient's behaviors, staffing, and acuity of their current patients"  Consulted with [facility] Supervisor and will follow for patient's reactions to medication changes made by psych on 6/7.     Barriers to Discharge: Barriers Resolved  Expected Discharge Plan and Services         Expected Discharge Date: 03/19/24                                     Social Determinants of Health (SDOH) Interventions SDOH Screenings   Food Insecurity: Food Insecurity Present (03/07/2024)  Housing: High Risk (03/07/2024)  Transportation Needs: Unmet Transportation Needs (03/07/2024)  Utilities: At Risk (03/07/2024)  Tobacco Use: Low Risk  (03/07/2024)  Recent Concern: Tobacco Use - High Risk (03/07/2024)    Readmission Risk Interventions     No data to display

## 2024-03-23 ENCOUNTER — Encounter (HOSPITAL_COMMUNITY): Payer: Self-pay

## 2024-03-23 NOTE — Progress Notes (Addendum)
 Patient noted to have eloped from hospital last night 6/9 at 6:10 pm per chart review. Patient was under IVC.  CSW called [facility].They advised to call Brenita Callow of Courts. Called and spoke with Antigone at [facility] [employer]. Antigone states the IVC was not rescinded by staff before patient left the ED last night. Antigone states to file a Notice of Change in Commitment Recommendation so that IVC can be updated. Antigone denied additional steps needed by this CSW.   Antigone states if patient returns and still meets IVC criteria, then a new IVC would need to be filed.   Psych NP Josephus Nida completed Notice of Change in Commitment Recommendation Form, CSW efiled form -- Envelope #[id number]   Molinda Angelica, LCSW Transitions of Care Department [phone]

## 2024-03-23 NOTE — Consult Note (Signed)
  Chart Note Addendum - Legal Documentation Assistance  This provider was contacted by the primary LCSW regarding the patient who had previously eloped while under an active Involuntary Commitment (IVC) order. A review of the medical record confirmed that the patient eloped during the IVC process and was subsequently located by [facility] ([employer]) and returned to the hospital's Emergency Department (ED). At that time, the patient's IV was removed, and he was ultimately released without a formal Notice of Commitment Change being completed.  Although this provider did not perform a clinical evaluation of the patient during this encounter, I was consulted solely to assist with the legal documentation process at the request of the magistrate and primary LCSW. I reviewed the pertinent chart information and collaborated with the LCSW to ensure the required Notice of Commitment Change was accurately completed and submitted in accordance with legal requirements. This entry is being made to support continuity of documentation and to reflect provider involvement in the legal aspects of the case. [employer] also filed LCSW made aware.

## 2024-03-26 ENCOUNTER — Emergency Department (HOSPITAL_COMMUNITY)
Admission: EM | Admit: 2024-03-26 | Discharge: 2024-03-26 | Disposition: A | Discharge status: Home / Self Care | Attending: Emergency Medicine | Admitting: Emergency Medicine

## 2024-03-26 ENCOUNTER — Encounter (HOSPITAL_COMMUNITY): Payer: Self-pay

## 2024-03-26 ENCOUNTER — Other Ambulatory Visit: Payer: Self-pay

## 2024-03-26 ENCOUNTER — Emergency Department (HOSPITAL_COMMUNITY)

## 2024-03-26 DIAGNOSIS — R059 Cough, unspecified: Secondary | ICD-10-CM | POA: Diagnosis not present

## 2024-03-26 DIAGNOSIS — R600 Localized edema: Secondary | ICD-10-CM | POA: Insufficient documentation

## 2024-03-26 DIAGNOSIS — Z5189 Encounter for other specified aftercare: Secondary | ICD-10-CM

## 2024-03-26 DIAGNOSIS — Z7901 Long term (current) use of anticoagulants: Secondary | ICD-10-CM | POA: Diagnosis not present

## 2024-03-26 LAB — CBC
HCT: 31.6 % — ABNORMAL LOW (ref 39.0–52.0)
Hemoglobin: 10 g/dL — ABNORMAL LOW (ref 13.0–17.0)
MCH: 31 pg (ref 26.0–34.0)
MCHC: 31.6 g/dL (ref 30.0–36.0)
MCV: 97.8 fL (ref 80.0–100.0)
Platelets: 455 10*3/uL — ABNORMAL HIGH (ref 150–400)
RBC: 3.23 MIL/uL — ABNORMAL LOW (ref 4.22–5.81)
RDW: 15.1 % (ref 11.5–15.5)
WBC: 5.8 10*3/uL (ref 4.0–10.5)
nRBC: 0 % (ref 0.0–0.2)

## 2024-03-26 LAB — BASIC METABOLIC PANEL WITH GFR
Anion gap: 9 (ref 5–15)
BUN: 16 mg/dL (ref 6–20)
CO2: 25 mmol/L (ref 22–32)
Calcium: 9.2 mg/dL (ref 8.9–10.3)
Chloride: 102 mmol/L (ref 98–111)
Creatinine, Ser: 0.72 mg/dL (ref 0.61–1.24)
GFR, Estimated: >60 mL/min (ref >60)
Glucose, Bld: 106 mg/dL — ABNORMAL HIGH (ref 70–99)
Potassium: 3.8 mmol/L (ref 3.5–5.1)
Sodium: 136 mmol/L (ref 135–145)

## 2024-03-26 MED ORDER — ACETAMINOPHEN 325 MG PO TABS
650.0000 mg | ORAL_TABLET | Freq: Once | ORAL | Status: AC
  Administered 2024-03-26: 650 mg via ORAL
  Filled 2024-03-26: qty 2

## 2024-03-26 MED ORDER — IBUPROFEN 800 MG PO TABS: 800.0000 mg | ORAL_TABLET | Freq: Three times a day (TID) | ORAL | 0 refills | Status: AC | PRN

## 2024-03-26 MED ORDER — ACETAMINOPHEN 325 MG PO TABS: 650.0000 mg | ORAL_TABLET | Freq: Three times a day (TID) | ORAL | 0 refills | Status: AC | PRN

## 2024-03-26 NOTE — Progress Notes (Signed)
 Orthopedic Tech Progress Note Patient Details:  [name] Jul 19, 1996 [id number]  Patient ID: [name], male   DOB: [birth year], 28 y.o.   MRN: [id number] Nurse gave pt. Crutches before discharge. [name] 03/26/2024, 8:42 PM
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
## 2024-03-26 NOTE — Discharge Instructions (Addendum)
 You will need to call to schedule an appointment with Dr Guyann Leitz, the orthopedic doctor, in 2 weeks.  They will reexamine you at that time and probably remove the stitches as well (from your lower abdomen).  It is very important that you follow-up in the office in 2 weeks.  The stitches need to be removed, and the doctor needs to clear you before you can walk with full weight on your legs again.  Until then, you need to try to keep as much weight as possible off of your legs. Use a wheelchair or crutches whenever possible.
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
## 2024-03-26 NOTE — ED Notes (Signed)
 Pt repositioned in bed. Snacks & juice provided
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
## 2024-03-26 NOTE — ED Notes (Signed)
 Please update mother [phone] [place]
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
## 2024-07-28 ENCOUNTER — Emergency Department (HOSPITAL_COMMUNITY)
Admission: EM | Admit: 2024-07-28 | Discharge: 2024-07-29 | Disposition: A | Discharge status: Home / Self Care | Attending: Emergency Medicine | Admitting: Emergency Medicine

## 2024-07-28 ENCOUNTER — Encounter (HOSPITAL_COMMUNITY): Payer: Self-pay | Admitting: Emergency Medicine

## 2024-07-28 ENCOUNTER — Other Ambulatory Visit: Payer: Self-pay

## 2024-07-28 DIAGNOSIS — F250 Schizoaffective disorder, bipolar type: Secondary | ICD-10-CM | POA: Diagnosis not present

## 2024-07-28 DIAGNOSIS — Z7901 Long term (current) use of anticoagulants: Secondary | ICD-10-CM | POA: Diagnosis not present

## 2024-07-28 DIAGNOSIS — F14259 Cocaine dependence with cocaine-induced psychotic disorder, unspecified: Secondary | ICD-10-CM | POA: Diagnosis not present

## 2024-07-28 DIAGNOSIS — F1420 Cocaine dependence, uncomplicated: Secondary | ICD-10-CM | POA: Diagnosis present

## 2024-07-28 DIAGNOSIS — Z79899 Other long term (current) drug therapy: Secondary | ICD-10-CM | POA: Diagnosis not present

## 2024-07-28 DIAGNOSIS — F29 Unspecified psychosis not due to a substance or known physiological condition: Secondary | ICD-10-CM

## 2024-07-28 DIAGNOSIS — R461 Bizarre personal appearance: Secondary | ICD-10-CM | POA: Diagnosis present

## 2024-07-28 DIAGNOSIS — F19959 Other psychoactive substance use, unspecified with psychoactive substance-induced psychotic disorder, unspecified: Secondary | ICD-10-CM | POA: Diagnosis not present

## 2024-07-28 DIAGNOSIS — Z818 Family history of other mental and behavioral disorders: Secondary | ICD-10-CM | POA: Insufficient documentation

## 2024-07-28 MED ORDER — LORAZEPAM 2 MG/ML IJ SOLN
2.0000 mg | Freq: Once | INTRAMUSCULAR | Status: AC
  Administered 2024-07-29: 2 mg via INTRAVENOUS
  Filled 2024-07-28: qty 1
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
## 2024-07-29 ENCOUNTER — Encounter (HOSPITAL_COMMUNITY): Payer: Self-pay | Admitting: Psychiatry

## 2024-07-29 DIAGNOSIS — F1420 Cocaine dependence, uncomplicated: Secondary | ICD-10-CM

## 2024-07-29 DIAGNOSIS — F19959 Other psychoactive substance use, unspecified with psychoactive substance-induced psychotic disorder, unspecified: Secondary | ICD-10-CM

## 2024-07-29 DIAGNOSIS — F14259 Cocaine dependence with cocaine-induced psychotic disorder, unspecified: Secondary | ICD-10-CM | POA: Diagnosis not present

## 2024-07-29 LAB — ETHANOL: Alcohol, Ethyl (B): <15 mg/dL (ref <15)

## 2024-07-29 LAB — COMPREHENSIVE METABOLIC PANEL WITH GFR
ALT: 52 U/L — ABNORMAL HIGH (ref 0–44)
AST: 314 U/L — ABNORMAL HIGH (ref 15–41)
Albumin: 4.5 g/dL (ref 3.5–5.0)
Alkaline Phosphatase: 80 U/L (ref 38–126)
Anion gap: 14 (ref 5–15)
BUN: 25 mg/dL — ABNORMAL HIGH (ref 6–20)
CO2: 22 mmol/L (ref 22–32)
Calcium: 9.2 mg/dL (ref 8.9–10.3)
Chloride: 107 mmol/L (ref 98–111)
Creatinine, Ser: 1.29 mg/dL — ABNORMAL HIGH (ref 0.61–1.24)
GFR, Estimated: >60 mL/min (ref >60)
Glucose, Bld: 171 mg/dL — ABNORMAL HIGH (ref 70–99)
Potassium: 3.9 mmol/L (ref 3.5–5.1)
Sodium: 143 mmol/L (ref 135–145)
Total Bilirubin: 1.5 mg/dL — ABNORMAL HIGH (ref 0.0–1.2)
Total Protein: 7.8 g/dL (ref 6.5–8.1)

## 2024-07-29 LAB — CBC WITH DIFFERENTIAL/PLATELET
Abs Immature Granulocytes: 0.03 K/uL (ref 0.00–0.07)
Basophils Absolute: 0.1 K/uL (ref 0.0–0.1)
Basophils Relative: 1 %
Eosinophils Absolute: 0 K/uL (ref 0.0–0.5)
Eosinophils Relative: 0 %
HCT: 38.5 % — ABNORMAL LOW (ref 39.0–52.0)
Hemoglobin: 12.8 g/dL — ABNORMAL LOW (ref 13.0–17.0)
Immature Granulocytes: 0 %
Lymphocytes Relative: 14 %
Lymphs Abs: 1.3 K/uL (ref 0.7–4.0)
MCH: 29.8 pg (ref 26.0–34.0)
MCHC: 33.2 g/dL (ref 30.0–36.0)
MCV: 89.5 fL (ref 80.0–100.0)
Monocytes Absolute: 0.7 K/uL (ref 0.1–1.0)
Monocytes Relative: 7 %
Neutro Abs: 7.7 K/uL (ref 1.7–7.7)
Neutrophils Relative %: 78 %
Platelets: 277 K/uL (ref 150–400)
RBC: 4.3 MIL/uL (ref 4.22–5.81)
RDW: 14.6 % (ref 11.5–15.5)
WBC: 9.8 K/uL (ref 4.0–10.5)
nRBC: 0 % (ref 0.0–0.2)

## 2024-07-29 LAB — RAPID URINE DRUG SCREEN, HOSP PERFORMED
Amphetamines: NOT DETECTED
Barbiturates: NOT DETECTED
Benzodiazepines: NOT DETECTED
Cocaine: POSITIVE — AB
Opiates: NOT DETECTED
Tetrahydrocannabinol: NOT DETECTED

## 2024-07-29 MED ORDER — LORAZEPAM 2 MG/ML IJ SOLN
2.0000 mg | Freq: Once | INTRAMUSCULAR | Status: AC
  Administered 2024-07-29: 2 mg via INTRAVENOUS
  Filled 2024-07-29: qty 1

## 2024-07-29 NOTE — ED Notes (Signed)
 Assessment delayed to promote pt rest.

## 2024-07-29 NOTE — ED Notes (Signed)
 Discussed with TTS coordinator. Informed pt is sleeping after ativan  - see mar. Unable to participate in exam.

## 2024-07-29 NOTE — ED Notes (Signed)
 Pt arrives to room 49. Report given by previous RN. Pt was able to roll over to hospital bed. Is accompanied by sitter. Pt sleeping.

## 2024-07-29 NOTE — Consult Note (Addendum)
 [facility] Psychiatric Consult Initial  Patient Name: .[name]  MRN: [id number]  DOB: 26-Sep-1996  Consult Order details:  Orders (From admission, onward)     Start     Ordered   07/29/24 0315  CONSULT TO CALL ACT TEAM       Ordering Provider: Logan Ubaldo NOVAK, PA-C  Provider:  (Not yet assigned)  Question:  Reason for Consult?  Answer:  Psych consult   07/29/24 0315             Mode of Visit: In person    Psychiatry Consult Evaluation  Service Date: July 29, 2024 LOS:  LOS: 0 days  Chief Complaint: IVC- bizarre behavior in community   Primary Psychiatric Diagnoses    Psychoactive substance-induced psychosis (HCC) 2.    Cocaine use disorder, severe, dependence (HCC)  Assessment   [name] is a 28 y.o. [name] with a past psychiatric history of schizophrenia, schizoaffective disorder, polysubstance abuse (I.e., cannabis, cocaine), substance-induced mood/psychotic disorder, and a cocaine and cannabis use disorder, with pertinent medical comorbidities/history that include none, who presented this encounter by way of [employer] under involuntary commitment taken out, for concerns for bizarre behavior demonstrated while out in the community observed by bystanders, who upon EDP evaluation, consulted psychiatry for specialty evaluation and recommendations.  Patient is currently medically clear, per EDP team, as well as remains under involuntary commitment this time.  Upon evaluation, patient presents with symptomology this encounter that is most consistent with a psychoactive substance induced psychotic episode, in the context of recent heavy cocaine use, as well as the patient's chronic illness course of cocaine use disorder, severe, dependence. Evidence of this is appreciable from evaluation conducted, where patient presents with no objective and/or subjective signs of continued psychosis during examination by this provider, UDS drug screen is positive for cocaine metabolites,  and the patient endorses recent heavy cocaine use just prior to coming in this encounter, with subsequent chart review that reveals severe use disorder (chronically).  Given during examination the patient does not present with any concerns for continued decompensation into psychosis, present with concern for lack of capacity, present with endorsements of suicidal and or homicidal ideations, and or present with concerns for continued intoxication and/or withdrawal from endorsed recent heavy cocaine use, of which all give evidence that the patient is not an imminent risk to himself or others, recommendation is for psychiatric clearance, as well as additional recommendations listed below.  During investigation conducted with the patient, patient endorses that he is not amenable to addressing his substance abuse or mental health, or amenable to taking medications, thus given the above, will proceed with giving patient resources and the below recommendations for discharge.   Patient endorses upon discharge he will return to his sisters residence where he resides at; discussed with patient this provider would consult TOC to help facilitate safe transition back home.  Spoke with Dr. Goli who is in agreement with recommendation for psychiatric clearance, as well as additional recommendations listed below.  Diagnoses:  Active Hospital problems: Principal Problem:   Psychoactive substance-induced psychosis (HCC) Active Problems:   Cocaine use disorder, severe, dependence (HCC)    Plan   #Psychoactive substance-induced psychosis (HCC) #Cocaine use disorder, severe, dependence (HCC)  ## Psychiatric Recommendations:   - Recommend TOC consult for transportation - Recommend safety return precautions - Recommend patient consider substance abuse treatment, if/when amenable - Recommend patient consider abstinence from endorsed illicit substance use - Recommend patient consider close outpatient  follow-up  with substance abuse and mental health community resources  ## Medical Decision Making Capacity: Has capacity  ## Further Work-up: None at this time  ## Disposition:-- There are no psychiatric contraindications to discharge at this time  ## Behavioral / Environmental: -Routine agitation/safety precautions until discharge; safety return precautions upon discharge    ## Safety and Observation Level:  - Based on my clinical evaluation, I estimate the patient to be at low risk of self harm in the current setting and upon recommendation for discharge. - At this time, we recommend  routine. This decision is based on my review of the chart including patient's history and current presentation, interview of the patient, mental status examination, and consideration of suicide risk including evaluating suicidal ideation, plan, intent, suicidal or self-harm behaviors, risk factors, and protective factors. This judgment is based on our ability to directly address suicide risk, implement suicide prevention strategies, and develop a safety plan while the patient is in the clinical setting. Please contact our team if there is a concern that risk level has changed.  CSSR Risk Category:C-SSRS RISK CATEGORY: No Risk  Suicide Risk Assessment: Patient has following modifiable risk factors for suicide: medication noncompliance, lack of access to outpatient mental health resources, triggering events, and recent psychiatric hospitalization, which we are addressing by recommendations. Patient has following non-modifiable or demographic risk factors for suicide: male gender and psychiatric hospitalization Patient has the following protective factors against suicide: Access to outpatient mental health care, Supportive family, Supportive friends, Frustration tolerance, no history of suicide attempts, and no history of NSSIB  Thank you for this consult request. Recommendations have been communicated to the primary team.   We will sign off at this time.   [name], NP       History of Present Illness   [name] is a 28 y.o. [name] with a past psychiatric history of schizophrenia, schizoaffective disorder, polysubstance abuse (I.e., cannabis, cocaine), substance-induced mood/psychotic disorder, and a cocaine and cannabis use disorder, with pertinent medical comorbidities/history that include none, who presented this encounter by way of [employer] under involuntary commitment taken out, for concerns for bizarre behavior demonstrated while out in the community observed by bystanders, who upon EDP evaluation, consulted psychiatry for specialty evaluation and recommendations.  Patient is currently medically clear, per EDP team, as well as remains under involuntary commitment this time.  Patient seen today at the [facility] emergency department for face-to-face psychiatric evaluation.  Upon evaluation, patient presents with a very reserved interpersonal style, with minimal and selective cooperation, and frequently expressed desire to discharge back to his sister's house.  Patient endorses that he largely does not remember the recent events that transpired, of which led to him being brought in, but does give some insight, stating that yesterday evening just prior to being brought in, he endorses that he remembers leaving his sister's house to go down the street to smoke crack and chill, because he states he is not allowed to utilize illicit substances in his sisters residence that he stays at, when after he states he smoked several fat bowls of crack cocaine, he states he just remembers becoming very anxious and worked up and not himself, and then finally waking up after sleeping in the emergency room.   Patient endorses that he uses crack cocaine often, but not every day, states that the last time he utilized crack cocaine before today was, a few days ago; UDS positive for cocaine, BAL unremarkable.  Patient  gives insight that he has been using crack cocaine for a very long time, but endorses that his cocaine use is not an issue, and forwards that he does not desire substance abuse treatment.  Attempted to discuss the patient's substance abuse history, as well as give encouragement that the patient consider substance abuse/mental health treatment, but outside of endorsing no EtOH use and daily tobacco use, patient endorsed that he was not amenable to this, and that he wanted to discharge when able.  Patient endorses no instability of his mental health, denies any current auditory or visual hallucinations, denies paranoia, denies ideas of reference, and endorses no suicidal or homicidal ideations, or self injury/suicide attempts.  Patient orientation is intact, no concerns for fluctuations in consciousness, and objectively, does not appear to be presenting with psychotic features, and appears to have metabolized the illicit substances from his system; presents with no evidence of current intoxication and/or withdrawal symptomology, objectively and/or subjectively (outside of fatigue).  Patient presents with a reserved and superficially linear thought process, with a reserved interpersonal style as stated above, with fair eye contact, and a mildly constricted affect.  Patient endorses his mood is, I'm fine.  Patient endorses no problems with sleeping or eating.  Patient endorses that he is not on any medications and notably endorses that he is not amenable to taking medications.  Attempted to continue to ask various other questions during examination, but outside of the above, patient eventually in a somewhat curt manor, endorses that he does not want to continue with examination, and just wants to be updated when he is allowed to leave; states to this provider that he wants to discharge back to his sister's house.   Discussed with patient that given examination conducted, and his desires to not participate in  substance abuse/mental health treatment, or consider medications, the above recommendations would be given, as well as the recommendation for psychiatric clearance, to which patient endorsed that he was amenable to this.   Review of Systems  Constitutional:  Positive for malaise/fatigue. Negative for chills and weight loss.  Gastrointestinal:  Negative for abdominal pain, diarrhea, nausea and vomiting.  Musculoskeletal:  Negative for myalgias.  Neurological:  Negative for seizures, loss of consciousness and headaches.  Psychiatric/Behavioral:  Positive for substance abuse (Crack cocaine often). Negative for depression, hallucinations and suicidal ideas. The patient is not nervous/anxious and does not have insomnia.   All other systems reviewed and are negative.    Psychiatric and Social History  Psychiatric History:  Information collected from chart review/patient  Prev Dx/Sx: As above Current Psych Provider: Denies Home Meds (current): Denies Previous Med Trials: Per chart Therapy: None reported  Prior Psych Hospitalization: Multiple Prior Self Harm: None reported Prior Violence: Yes, when intoxicated, per chart review  Family Psych History: None reported Family Hx suicide: None reported  Social History:  Developmental Yk:Wnwz reported  Educational Hx: None reported Occupational Hx: SSDI Legal Hx: IVC multiple times Living Situation: Lives with sister Spiritual Hx: None reported Access to weapons/lethal means: Denies  Substance History Alcohol: Denies Tobacco: Daily Illicit drugs: Crack cocaine, cannabis Prescription drug abuse: None reported Rehab hx: None reported  Exam Findings  Physical Exam: As below Vital Signs:  Temp:  [98.6 F (37 C)-98.7 F (37.1 C)] 98.7 F (37.1 C) (10/16 0304) Pulse Rate:  [90-153] 90 (10/16 0304) Resp:  [16-18] 16 (10/16 0304) BP: (103-136)/(74-113) 103/74 (10/16 0304) SpO2:  [93 %] 93 % (10/15 2325) Weight:  [80.3 kg] 80.3 kg  (  10/15 2324) Blood pressure 103/74, pulse 90, temperature 98.7 F (37.1 C), temperature source Oral, resp. rate 16, weight 80.3 kg, SpO2 93%. Body mass index is 22.73 kg/m.  Physical Exam Vitals and nursing note reviewed.  Constitutional:      General: He is not in acute distress.    Appearance: He is normal weight. He is not ill-appearing, toxic-appearing or diaphoretic.     Comments: Reserved interpersonal style   Pulmonary:     Effort: Pulmonary effort is normal.  Skin:    General: Skin is warm and dry.  Neurological:     Mental Status: He is alert and oriented to person, place, and time.     Motor: No weakness, tremor or seizure activity.  Psychiatric:        Attention and Perception: Attention and perception normal. He does not perceive auditory or visual hallucinations.        Mood and Affect: Mood normal.        Thought Content: Thought content normal. Thought content is not paranoid or delusional. Thought content does not include homicidal or suicidal ideation.        Judgment: Judgment is inappropriate.     Comments: Affect: Mildly constricted Speech: Reserved, minimal Judgement: Intact, but poor to fair Cognition and memory: Grossly intact Behavior: Minimally and selectively cooperative     Mental Status Exam: General Appearance: Normal bulk and tone African-American male with reserved interpersonal style in scrubs with fair grooming and hygiene  Orientation:  Full (Time, Place, and Person)  Memory:  Grossly intact  Concentration:  Concentration: Fair and Attention Span: Fair  Recall:  Fair  Attention  Fair  Eye Contact:  Fair  Speech: Reserved, minimal  Language:  Poor to fair  Volume:  Normal  Mood: Euthymic  Affect:  Constricted  Thought Process:  Coherent, Goal Directed, and Linear  Thought Content:  Logical, superficial  Suicidal Thoughts:  No  Homicidal Thoughts:  No  Judgement:  Intact, but inappropriate  Insight:  Lacking  Psychomotor Activity:   Normal  Akathisia:  No  Fund of Knowledge:  Grossly intact      Assets:  Architect Housing Leisure Time Physical Health Resilience Social Support Talents/Skills Vocational/Educational  Cognition:  WNL  ADL's:  Intact  AIMS (if indicated):   0     Other History   These have been pulled in through the EMR, reviewed, and updated if appropriate.  Family History:  The patient's family history includes Bipolar disorder in his mother; Diabetes in his mother; Hypertension in his mother.  Medical History: Past Medical History:  Diagnosis Date   ADHD (attention deficit hyperactivity disorder)    Schizophrenia (HCC)    Substance abuse (HCC)     Surgical History: Past Surgical History:  Procedure Laterality Date   BLADDER REPAIR N/A 03/07/2024   Procedure: REPAIR, BLADDER;  Surgeon: Alvaro Ricardo KATHEE Mickey., MD;  Location: [facility] OR;  Service: Urology;  Laterality: N/A;   ORIF ACETABULAR FRACTURE Right 03/07/2024   Procedure: OPEN REDUCTION INTERNAL FIXATION (ORIF) ACETABULAR FRACTURE;  Surgeon: Celena Sharper, MD;  Location: [facility] OR;  Service: Orthopedics;  Laterality: Right;   ORIF PELVIC FRACTURE N/A 03/07/2024   Procedure: OPEN REDUCTION INTERNAL FIXATION (ORIF) PELVIC FRACTURE;  Surgeon: Celena Sharper, MD;  Location: [facility] OR;  Service: Orthopedics;  Laterality: N/A;     Medications:  No current facility-administered medications for this encounter.  Current Outpatient Medications:    acetaminophen  (TYLENOL ) 325 MG tablet, Take 2  tablets (650 mg total) by mouth every 8 (eight) hours as needed for up to 30 doses., Disp: 30 tablet, Rfl: 0   apixaban  (ELIQUIS ) 2.5 MG TABS tablet, Take 1 tablet (2.5 mg total) by mouth 2 (two) times daily., Disp: 180 tablet, Rfl: 0   ARIPiprazole  (ABILIFY ) 5 MG tablet, Take 1 tablet (5 mg total) by mouth daily., Disp: 90 tablet, Rfl: 0   ARIPiprazole  ER 400 MG SRER, Inject 400 mg into the muscle every 30 (thirty) days.  (Due on 11-22-17): For mood control (Patient not taking: Reported on 04/14/2019), Disp: 1 each, Rfl: 0   hydrOXYzine  (ATARAX /VISTARIL ) 25 MG tablet, Take 1 tablet (25 mg total) by mouth 3 (three) times daily as needed for anxiety. (Patient not taking: Reported on 04/14/2019), Disp: 60 tablet, Rfl: 0   ibuprofen  (ADVIL ) 800 MG tablet, Take 1 tablet (800 mg total) by mouth every 8 (eight) hours as needed for up to 30 doses., Disp: 30 tablet, Rfl: 0   nicotine  polacrilex (NICORETTE ) 2 MG gum, Take 1 each (2 mg total) by mouth as needed for smoking cessation. (May purchase from over the counter): For smoking cessation (Patient not taking: Reported on 04/14/2019), Disp: 100 tablet, Rfl: 0   oxyCODONE  (OXY IR/ROXICODONE ) 5 MG immediate release tablet, Take 1-2 tablets (5-10 mg total) by mouth every 4 (four) hours as needed for moderate pain (pain score 4-6) or severe pain (pain score 7-10) (5mg  for moderate pain, 10mg  for severe pain)., Disp: 30 tablet, Rfl: 0   pregabalin  (LYRICA ) 100 MG capsule, Take 1 capsule (100 mg total) by mouth 3 (three) times daily., Disp: 90 capsule, Rfl: 0   traZODone  (DESYREL ) 100 MG tablet, Take 1 tablet (100 mg total) by mouth at bedtime as needed for sleep. (Patient not taking: Reported on 04/14/2019), Disp: 30 tablet, Rfl: 0  Allergies: Allergies  Allergen Reactions   Morphine  And Codeine Hives   Morphine  And Codeine Hives    Per mother     [name], NP

## 2024-07-29 NOTE — ED Notes (Signed)
 Pt belongings brought back by previous RN. Placed in locker #5

## 2024-07-29 NOTE — ED Provider Notes (Addendum)
  [place] EMERGENCY DEPARTMENT AT [facility] Observation Re-evaluation Note  [name] is a 28 y.o. male, seen on rounds today.  Pt initially presented on 07/28/24 at 2316 to the ED for complaints of  Chief Complaint  Patient presents with   IVC  Patient was initially found on the streets with bizarre behavior, brought in by law enforcement under IVC.  Patient was given Ativan  with improvement in agitation and tachycardia, UDS was positive for cocaine.  Psychiatry was consulted. PMHx: schizoaffective disorder bipolar type, cannabis use disorder, aggressive behavior  Currently, the patient is sleeping calmly.  Physical Exam  BP 103/74   Pulse 90   Temp 98.7 F (37.1 C) (Oral)   Resp 16   Wt 80.3 kg   SpO2 93%   BMI 22.73 kg/m  Physical Exam General: NAD Lungs: Normal effort Psych: Currently calm  ED Course / MDM  EKG:EKG Interpretation Date/Time:    Ventricular Rate:    PR Interval:    QRS Duration:    QT Interval:    QTC Calculation:   R Axis:      Text Interpretation:    I have reviewed the labs performed to date as well as medications administered while in observation.  Recent changes in the last 24 hours include no acute events or new results since initial evaluation.  As of yet, no psychiatry recommendations available.  Plan  Current plan is for TTS consult.  TTS evaluated patient. Per Jerel Gravely, NP:  #Psychoactive substance-induced psychosis (HCC) #Cocaine use disorder, severe, dependence (HCC)  ## Psychiatric Recommendations:   - Recommend TOC consult for transportation - Recommend safety return precautions - Recommend patient consider substance abuse treatment, if/when amenable - Recommend patient consider abstinence from endorsed illicit substance use - Recommend patient consider close outpatient follow-up with substance abuse and mental health community resources  ## Medical Decision Making Capacity: Has  capacity  ## Further Work-up: None at this time  ## Disposition:-- There are no psychiatric contraindications to discharge at this time  ## Behavioral / Environmental: -Routine agitation/safety precautions until discharge; safety return precautions upon discharge   Notice of commitment change completed, and faxed by secretary.  Patient discharged.   Rogelia Jerilynn RAMAN, MD 07/29/24 [id number]    Rogelia Jerilynn RAMAN, MD 07/29/24 [phone]

## 2024-07-29 NOTE — ED Notes (Signed)
 Pt continues trying to pull of sticker, disconnect cords and disconnecting label printer from the computer ect. Redirected to leave things alone.

## 2024-07-29 NOTE — ED Notes (Addendum)
 Pt was given urinal and asked to give urine sample he stood and turned towards the computer to cover himself. I left the door cracked. It was taking a few mins and I noticed urinal laying on the computer stand. Walked in to have found pt taping his penis with a role of tape. Pt taped several layers around the complete length of his penis. Tried to have him pull it off and it was stuck in his pubic hair and more. Scissors used to get taped removed the rest of the way.

## 2024-07-29 NOTE — ED Provider Notes (Signed)
 [place] EMERGENCY DEPARTMENT AT [facility] Provider Note   CSN: [id number] Arrival date & time: 07/28/24  2316     Patient presents with: IVC   [name] is a 28 y.o. male.  Patient with history of schizoaffective disorder bipolar type, cannabis use disorder, aggressive behavior presents emergency department via EMS due to bizarre behavior.  He is under involuntary commitment placed by Geneticist, molecular.  The patient reportedly was found standing in the road acting bizarrely.  He was yelling at no one in particular.  He was talking to himself.  Law enforcement staff reports possible drug paraphernalia on patient's person but patient denies any alcohol or drug use.  He is unable/unwilling to discuss complaints with me but does shake his head no when I ask if he has any pain, shortness of breath, or other physical complaints.  Level 5 caveat due to patient's mental status   HPI     Prior to Admission medications   Medication Sig Start Date End Date Taking? Authorizing Provider  acetaminophen  (TYLENOL ) 325 MG tablet Take 2 tablets (650 mg total) by mouth every 8 (eight) hours as needed for up to 30 doses. 03/26/24   Cottie Donnice PARAS, MD  acetaminophen  (TYLENOL ) 500 MG tablet Take 2 tablets (1,000 mg total) by mouth every 6 (six) hours. 03/18/24   Stechschulte, Deward PARAS, MD  apixaban  (ELIQUIS ) 2.5 MG TABS tablet Take 1 tablet (2.5 mg total) by mouth 2 (two) times daily. 03/18/24 06/16/24  Stechschulte, Deward PARAS, MD  ARIPiprazole  (ABILIFY ) 5 MG tablet Take 1 tablet (5 mg total) by mouth daily. 03/19/24 06/17/24  Stechschulte, Deward PARAS, MD  ARIPiprazole  ER 400 MG SRER Inject 400 mg into the muscle every 30 (thirty) days. (Due on 11-22-17): For mood control Patient not taking: Reported on 04/14/2019 11/22/17   Collene Gouge I, NP  hydrOXYzine  (ATARAX /VISTARIL ) 25 MG tablet Take 1 tablet (25 mg total) by mouth 3 (three) times daily as needed for anxiety. Patient not taking: Reported on 04/14/2019  10/28/17   Collene Gouge I, NP  ibuprofen  (ADVIL ) 800 MG tablet Take 1 tablet (800 mg total) by mouth every 8 (eight) hours as needed for up to 30 doses. 03/26/24   Cottie Donnice PARAS, MD  nicotine  polacrilex (NICORETTE ) 2 MG gum Take 1 each (2 mg total) by mouth as needed for smoking cessation. (May purchase from over the counter): For smoking cessation Patient not taking: Reported on 04/14/2019 10/28/17   Collene Gouge I, NP  oxyCODONE  (OXY IR/ROXICODONE ) 5 MG immediate release tablet Take 1-2 tablets (5-10 mg total) by mouth every 4 (four) hours as needed for moderate pain (pain score 4-6) or severe pain (pain score 7-10) (5mg  for moderate pain, 10mg  for severe pain). 03/18/24   Stechschulte, Deward PARAS, MD  pregabalin  (LYRICA ) 100 MG capsule Take 1 capsule (100 mg total) by mouth 3 (three) times daily. 03/18/24   Stechschulte, Deward PARAS, MD  traZODone  (DESYREL ) 100 MG tablet Take 1 tablet (100 mg total) by mouth at bedtime as needed for sleep. Patient not taking: Reported on 04/14/2019 10/28/17   Collene Gouge I, NP    Allergies: Morphine  and codeine and Morphine  and codeine    Review of Systems  Updated Vital Signs BP 103/74   Pulse 90   Temp 98.7 F (37.1 C) (Oral)   Resp 16   Wt 80.3 kg   SpO2 93%   BMI 22.73 kg/m   Physical Exam Vitals reviewed.  HENT:  Head: Normocephalic and atraumatic.  Eyes:     Pupils: Pupils are equal, round, and reactive to light.  Cardiovascular:     Rate and Rhythm: Regular rhythm. Tachycardia present.     Comments: Tachycardia, improved after medication  Pulmonary:     Effort: Pulmonary effort is normal.     Breath sounds: Normal breath sounds.  Musculoskeletal:        General: No signs of injury.     Cervical back: Normal range of motion.  Skin:    General: Skin is dry.  Neurological:     Mental Status: He is alert.  Psychiatric:        Speech: Speech normal.     Comments: Patient acting anxious     (all labs ordered are listed, but only abnormal  results are displayed) Labs Reviewed  COMPREHENSIVE METABOLIC PANEL WITH GFR - Abnormal; Notable for the following components:      Result Value   Glucose, Bld 171 (*)    BUN 25 (*)    Creatinine, Ser 1.29 (*)    AST 314 (*)    ALT 52 (*)    Total Bilirubin 1.5 (*)    All other components within normal limits  RAPID URINE DRUG SCREEN, HOSP PERFORMED - Abnormal; Notable for the following components:   Cocaine POSITIVE (*)    All other components within normal limits  CBC WITH DIFFERENTIAL/PLATELET - Abnormal; Notable for the following components:   Hemoglobin 12.8 (*)    HCT 38.5 (*)    All other components within normal limits  ETHANOL    EKG: None  Radiology: No results found.   .Critical Care  Performed by: Logan Ubaldo NOVAK, PA-C Authorized by: Logan Ubaldo NOVAK, PA-C   Critical care provider statement:    Critical care time (minutes):  30   Critical care time was exclusive of:  Separately billable procedures and treating other patients   Critical care was necessary to treat or prevent imminent or life-threatening deterioration of the following conditions: Patient requiring chemical restraint due to agitation.   Critical care was time spent personally by me on the following activities:  Development of treatment plan with patient or surrogate, discussions with consultants, evaluation of patient's response to treatment, examination of patient, ordering and review of laboratory studies, ordering and review of radiographic studies, ordering and performing treatments and interventions, pulse oximetry, re-evaluation of patient's condition and review of old charts    Medications Ordered in the ED  LORazepam  (ATIVAN ) injection 2 mg (2 mg Intravenous Given 07/29/24 0000)  LORazepam  (ATIVAN ) injection 2 mg (2 mg Intravenous Given 07/29/24 0145)                                    Medical Decision Making Amount and/or Complexity of Data Reviewed Labs:  ordered.  Risk Prescription drug management.   This patient presents to the ED for concern of altered mental status, this involves an extensive number of treatment options, and is a complaint that carries with it a high risk of complications and morbidity.  The differential diagnosis includes schizophrenia, substance-induced mood disorder, electrolyte abnormality, others   Co morbidities / Chronic conditions that complicate the patient evaluation  Schizophrenia   Additional history obtained:  Additional history obtained from EMR   Lab Tests:  I Ordered, and personally interpreted labs.  The pertinent results include: UDS positive for cocaine   Cardiac Monitoring: /  EKG:  The patient was maintained on a cardiac monitor.  I personally viewed and interpreted the cardiac monitored which showed an underlying rhythm of: Sinus tachycardia   Problem List / ED Course / Critical interventions / Medication management   I ordered medication including Ativan  Reevaluation of the patient after these medicines showed that the patient improved    Consultations Obtained:  I requested consultation with the behavioral health team   Social Determinants of Health:  Patient has issues with housing security   Test / Admission - Considered:  Patient with altered mental status, under involuntary commitment placed by Geneticist, molecular.  UDS positive for cocaine.  Ativan  administered due to agitation with improvement in symptoms.  Patient is medically clear at this time for psychiatric evaluation.  Disposition pending recommendations of behavioral health team and reassessment      Final diagnoses:  Psychosis, unspecified psychosis type [facility])    ED Discharge Orders     None          Logan Ubaldo KATHEE DEVONNA 07/29/24 0315    Theadore Ozell HERO, MD 07/29/24 [phone]

## 2024-07-29 NOTE — ED Notes (Signed)
 Belongings returned to pt (box, coat, shirt,pants,socks,back pack). Paper work reviewed with pt no questions at this time. Bus pass given to pt. Pt is in no new onset distress at this time and will be leaving for the lobby shortly.

## 2024-07-29 NOTE — Discharge Instructions (Addendum)
 Please adhere to safety return precautions Please consider substance abuse treatment, if/when amenable Please consider abstinence from endorsed illicit substance use Please consider close outpatient follow-up with substance abuse and mental health community resources  GUNNISON CHAHAL  Thank you for allowing us  to take care of you today.  You came to the Emergency Department today because please were called because you are behaving bizarrely.  Here in the emergency department you were positive for cocaine, which often times can cause abnormal behavior like you initially had.  After getting some medication to help calm you down and sleeping you are feeling much better.  We recommend discontinuing cocaine and other recreational substance use.  To-Do: 1. Please follow-up with your primary doctor within 1 - 2 weeks / as soon as possible.   Please return to the Emergency Department or call 911 if you experience have worsening of your symptoms, or do not get better, if you have thoughts of hurting yourself or anyone else, chest pain, shortness of breath, severe or significantly worsening pain, high fever, severe confusion, pass out or have any reason to think that you need emergency medical care.   We hope you feel better soon.   Department of Emergency Medicine [facility]
